# Patient Record
Sex: Male | Born: 1965
Health system: Southern US, Community
[De-identification: ages and names within clinical notes are randomized; demographics above are authoritative.]

## PROBLEM LIST (undated history)

## (undated) DIAGNOSIS — K635 Polyp of colon: Secondary | ICD-10-CM

## (undated) DIAGNOSIS — F419 Anxiety disorder, unspecified: Secondary | ICD-10-CM

## (undated) DIAGNOSIS — G4730 Sleep apnea, unspecified: Secondary | ICD-10-CM

## (undated) DIAGNOSIS — T7840XA Allergy, unspecified, initial encounter: Secondary | ICD-10-CM

## (undated) DIAGNOSIS — K317 Polyp of stomach and duodenum: Secondary | ICD-10-CM

## (undated) DIAGNOSIS — K219 Gastro-esophageal reflux disease without esophagitis: Secondary | ICD-10-CM

## (undated) DIAGNOSIS — F32A Depression, unspecified: Secondary | ICD-10-CM

## (undated) DIAGNOSIS — E785 Hyperlipidemia, unspecified: Secondary | ICD-10-CM

## (undated) DIAGNOSIS — J45909 Unspecified asthma, uncomplicated: Secondary | ICD-10-CM

## (undated) HISTORY — DX: Polyp of stomach and duodenum: K31.7

## (undated) HISTORY — DX: Sleep apnea, unspecified: G47.30

## (undated) HISTORY — DX: Depression, unspecified: F32.A

## (undated) HISTORY — DX: Polyp of colon: K63.5

## (undated) HISTORY — DX: Gastro-esophageal reflux disease without esophagitis: K21.9

## (undated) HISTORY — DX: Unspecified asthma, uncomplicated: J45.909

## (undated) HISTORY — DX: Hyperlipidemia, unspecified: E78.5

## (undated) HISTORY — DX: Allergy, unspecified, initial encounter: T78.40XA

## (undated) HISTORY — DX: Anxiety disorder, unspecified: F41.9

---

## 2004-07-17 ENCOUNTER — Ambulatory Visit: Payer: Self-pay | Admitting: Family Medicine

## 2005-09-23 ENCOUNTER — Ambulatory Visit: Payer: Self-pay | Admitting: Family Medicine

## 2006-07-30 ENCOUNTER — Ambulatory Visit: Payer: Self-pay | Admitting: Internal Medicine

## 2006-07-30 LAB — CONVERTED CEMR LAB
ALT: 33 units/L (ref 0–40)
AST: 24 units/L (ref 0–37)
Basophils Absolute: 0 10*3/uL (ref 0.0–0.1)
Direct LDL: 173.7 mg/dL
Eosinophils Absolute: 0.2 10*3/uL (ref 0.0–0.6)
HCT: 45.2 % (ref 39.0–52.0)
HDL: 37.4 mg/dL — ABNORMAL LOW (ref >39.0)
MCHC: 33.2 g/dL (ref 30.0–36.0)
MCV: 88.2 fL (ref 78.0–100.0)
Neutrophils Relative %: 48.6 % (ref 43.0–77.0)
Platelets: 220 10*3/uL (ref 150–400)
RBC: 5.12 M/uL (ref 4.22–5.81)
RDW: 12.1 % (ref 11.5–14.6)
Total CHOL/HDL Ratio: 6.3
Triglycerides: 143 mg/dL (ref 0–149)

## 2006-09-02 ENCOUNTER — Ambulatory Visit: Payer: Self-pay | Admitting: Family Medicine

## 2008-06-02 ENCOUNTER — Encounter: Payer: Self-pay | Admitting: Family Medicine

## 2008-06-06 ENCOUNTER — Ambulatory Visit: Payer: Self-pay | Admitting: Family Medicine

## 2008-06-06 DIAGNOSIS — R5381 Other malaise: Secondary | ICD-10-CM | POA: Insufficient documentation

## 2008-06-06 DIAGNOSIS — E785 Hyperlipidemia, unspecified: Secondary | ICD-10-CM | POA: Insufficient documentation

## 2008-06-06 DIAGNOSIS — R5383 Other fatigue: Secondary | ICD-10-CM

## 2008-06-14 ENCOUNTER — Ambulatory Visit: Payer: Self-pay | Admitting: Family Medicine

## 2008-06-14 LAB — CONVERTED CEMR LAB
OCCULT 1: NEGATIVE
OCCULT 2: NEGATIVE
OCCULT 3: NEGATIVE

## 2008-06-15 ENCOUNTER — Encounter (INDEPENDENT_AMBULATORY_CARE_PROVIDER_SITE_OTHER): Payer: Self-pay | Admitting: *Deleted

## 2008-06-21 ENCOUNTER — Encounter (INDEPENDENT_AMBULATORY_CARE_PROVIDER_SITE_OTHER): Payer: Self-pay | Admitting: *Deleted

## 2008-06-22 ENCOUNTER — Telehealth (INDEPENDENT_AMBULATORY_CARE_PROVIDER_SITE_OTHER): Payer: Self-pay | Admitting: *Deleted

## 2008-08-08 ENCOUNTER — Ambulatory Visit: Payer: Self-pay | Admitting: Family Medicine

## 2008-08-08 DIAGNOSIS — S335XXA Sprain of ligaments of lumbar spine, initial encounter: Secondary | ICD-10-CM | POA: Insufficient documentation

## 2008-08-08 DIAGNOSIS — J301 Allergic rhinitis due to pollen: Secondary | ICD-10-CM | POA: Insufficient documentation

## 2008-08-08 DIAGNOSIS — M79609 Pain in unspecified limb: Secondary | ICD-10-CM | POA: Insufficient documentation

## 2008-08-18 ENCOUNTER — Telehealth: Payer: Self-pay | Admitting: Family Medicine

## 2008-08-24 ENCOUNTER — Encounter: Payer: Self-pay | Admitting: Family Medicine

## 2008-08-29 ENCOUNTER — Telehealth: Payer: Self-pay | Admitting: Family Medicine

## 2008-09-05 ENCOUNTER — Telehealth: Payer: Self-pay | Admitting: Family Medicine

## 2008-09-26 ENCOUNTER — Telehealth (INDEPENDENT_AMBULATORY_CARE_PROVIDER_SITE_OTHER): Payer: Self-pay | Admitting: *Deleted

## 2008-10-06 ENCOUNTER — Encounter: Payer: Self-pay | Admitting: Family Medicine

## 2008-11-02 ENCOUNTER — Ambulatory Visit: Payer: Self-pay | Admitting: Family Medicine

## 2008-11-15 ENCOUNTER — Telehealth (INDEPENDENT_AMBULATORY_CARE_PROVIDER_SITE_OTHER): Payer: Self-pay | Admitting: *Deleted

## 2008-11-16 LAB — CONVERTED CEMR LAB
AST: 30 units/L (ref 0–37)
Albumin: 4.1 g/dL (ref 3.5–5.2)
Alkaline Phosphatase: 65 units/L (ref 39–117)
Bilirubin, Direct: 0.1 mg/dL (ref 0.0–0.3)
Total Bilirubin: 0.8 mg/dL (ref 0.3–1.2)
Total CHOL/HDL Ratio: 4
Triglycerides: 98 mg/dL (ref 0.0–149.0)

## 2008-12-12 ENCOUNTER — Ambulatory Visit: Payer: Self-pay | Admitting: Family Medicine

## 2008-12-12 DIAGNOSIS — H538 Other visual disturbances: Secondary | ICD-10-CM | POA: Insufficient documentation

## 2008-12-12 DIAGNOSIS — M25519 Pain in unspecified shoulder: Secondary | ICD-10-CM | POA: Insufficient documentation

## 2009-01-09 ENCOUNTER — Telehealth: Payer: Self-pay | Admitting: Family Medicine

## 2009-01-24 ENCOUNTER — Ambulatory Visit: Payer: Self-pay | Admitting: Family Medicine

## 2009-01-24 DIAGNOSIS — K625 Hemorrhage of anus and rectum: Secondary | ICD-10-CM | POA: Insufficient documentation

## 2009-01-24 DIAGNOSIS — K644 Residual hemorrhoidal skin tags: Secondary | ICD-10-CM | POA: Insufficient documentation

## 2009-01-26 ENCOUNTER — Encounter (INDEPENDENT_AMBULATORY_CARE_PROVIDER_SITE_OTHER): Payer: Self-pay | Admitting: *Deleted

## 2009-01-26 LAB — CONVERTED CEMR LAB
Basophils Absolute: 0 10*3/uL (ref 0.0–0.1)
Eosinophils Absolute: 0.1 10*3/uL (ref 0.0–0.7)
HCT: 43.4 % (ref 39.0–52.0)
Lymphs Abs: 1.5 10*3/uL (ref 0.7–4.0)
MCHC: 33.5 g/dL (ref 30.0–36.0)
MCV: 89.8 fL (ref 78.0–100.0)
Monocytes Absolute: 0.4 10*3/uL (ref 0.1–1.0)
Neutro Abs: 2.1 10*3/uL (ref 1.4–7.7)
Platelets: 206 10*3/uL (ref 150.0–400.0)
RDW: 12.3 % (ref 11.5–14.6)

## 2009-01-31 ENCOUNTER — Telehealth (INDEPENDENT_AMBULATORY_CARE_PROVIDER_SITE_OTHER): Payer: Self-pay | Admitting: *Deleted

## 2009-05-15 ENCOUNTER — Telehealth: Payer: Self-pay | Admitting: Family Medicine

## 2009-05-22 ENCOUNTER — Telehealth: Payer: Self-pay | Admitting: Family Medicine

## 2009-05-23 ENCOUNTER — Encounter: Payer: Self-pay | Admitting: Family Medicine

## 2009-05-23 ENCOUNTER — Ambulatory Visit: Payer: Self-pay | Admitting: Internal Medicine

## 2009-05-23 DIAGNOSIS — J309 Allergic rhinitis, unspecified: Secondary | ICD-10-CM | POA: Insufficient documentation

## 2009-05-23 DIAGNOSIS — R079 Chest pain, unspecified: Secondary | ICD-10-CM | POA: Insufficient documentation

## 2009-05-24 ENCOUNTER — Telehealth (INDEPENDENT_AMBULATORY_CARE_PROVIDER_SITE_OTHER): Payer: Self-pay | Admitting: *Deleted

## 2009-05-25 ENCOUNTER — Encounter (INDEPENDENT_AMBULATORY_CARE_PROVIDER_SITE_OTHER): Payer: Self-pay | Admitting: *Deleted

## 2009-05-25 LAB — CONVERTED CEMR LAB: Total CK: 1676 units/L — ABNORMAL HIGH (ref 7–232)

## 2009-05-30 ENCOUNTER — Telehealth (INDEPENDENT_AMBULATORY_CARE_PROVIDER_SITE_OTHER): Payer: Self-pay | Admitting: *Deleted

## 2009-06-02 ENCOUNTER — Ambulatory Visit: Payer: Self-pay | Admitting: Internal Medicine

## 2009-06-07 ENCOUNTER — Encounter (INDEPENDENT_AMBULATORY_CARE_PROVIDER_SITE_OTHER): Payer: Self-pay | Admitting: *Deleted

## 2009-06-07 ENCOUNTER — Ambulatory Visit: Payer: Self-pay | Admitting: Family Medicine

## 2009-06-07 DIAGNOSIS — B354 Tinea corporis: Secondary | ICD-10-CM | POA: Insufficient documentation

## 2009-06-07 LAB — CONVERTED CEMR LAB: Sed Rate: 8 mm/hr (ref 0–22)

## 2009-06-08 DIAGNOSIS — R748 Abnormal levels of other serum enzymes: Secondary | ICD-10-CM | POA: Insufficient documentation

## 2009-06-09 ENCOUNTER — Encounter (INDEPENDENT_AMBULATORY_CARE_PROVIDER_SITE_OTHER): Payer: Self-pay | Admitting: *Deleted

## 2009-06-09 ENCOUNTER — Telehealth (INDEPENDENT_AMBULATORY_CARE_PROVIDER_SITE_OTHER): Payer: Self-pay | Admitting: *Deleted

## 2009-06-14 ENCOUNTER — Telehealth: Payer: Self-pay | Admitting: Family Medicine

## 2009-06-15 ENCOUNTER — Telehealth: Payer: Self-pay | Admitting: Family Medicine

## 2009-07-10 ENCOUNTER — Encounter: Payer: Self-pay | Admitting: Family Medicine

## 2009-07-12 ENCOUNTER — Telehealth: Payer: Self-pay | Admitting: Family Medicine

## 2009-07-19 ENCOUNTER — Telehealth: Payer: Self-pay | Admitting: Family Medicine

## 2009-07-27 ENCOUNTER — Encounter (INDEPENDENT_AMBULATORY_CARE_PROVIDER_SITE_OTHER): Payer: Self-pay | Admitting: *Deleted

## 2009-07-27 ENCOUNTER — Ambulatory Visit: Payer: Self-pay | Admitting: Family Medicine

## 2009-07-27 DIAGNOSIS — K921 Melena: Secondary | ICD-10-CM | POA: Insufficient documentation

## 2009-07-28 ENCOUNTER — Telehealth (INDEPENDENT_AMBULATORY_CARE_PROVIDER_SITE_OTHER): Payer: Self-pay | Admitting: *Deleted

## 2009-08-23 ENCOUNTER — Encounter (INDEPENDENT_AMBULATORY_CARE_PROVIDER_SITE_OTHER): Payer: Self-pay | Admitting: *Deleted

## 2009-08-23 ENCOUNTER — Ambulatory Visit: Payer: Self-pay | Admitting: Gastroenterology

## 2009-09-01 ENCOUNTER — Ambulatory Visit: Payer: Self-pay | Admitting: Gastroenterology

## 2009-09-01 DIAGNOSIS — K635 Polyp of colon: Secondary | ICD-10-CM

## 2009-09-01 HISTORY — DX: Polyp of colon: K63.5

## 2009-09-05 ENCOUNTER — Encounter: Payer: Self-pay | Admitting: Gastroenterology

## 2009-11-10 ENCOUNTER — Telehealth (INDEPENDENT_AMBULATORY_CARE_PROVIDER_SITE_OTHER): Payer: Self-pay | Admitting: *Deleted

## 2009-11-14 ENCOUNTER — Ambulatory Visit: Payer: Self-pay | Admitting: Family Medicine

## 2009-11-15 LAB — CONVERTED CEMR LAB
ALT: 37 units/L (ref 0–53)
AST: 37 units/L (ref 0–37)
Alkaline Phosphatase: 68 units/L (ref 39–117)
Bilirubin, Direct: 0.1 mg/dL (ref 0.0–0.3)
Cholesterol: 145 mg/dL (ref 0–200)
Total Bilirubin: 0.3 mg/dL (ref 0.3–1.2)
Total Protein: 7.3 g/dL (ref 6.0–8.3)

## 2009-11-22 ENCOUNTER — Telehealth: Payer: Self-pay | Admitting: Family Medicine

## 2010-02-05 ENCOUNTER — Ambulatory Visit: Payer: Self-pay | Admitting: Family Medicine

## 2010-02-05 DIAGNOSIS — R209 Unspecified disturbances of skin sensation: Secondary | ICD-10-CM | POA: Insufficient documentation

## 2010-02-07 LAB — CONVERTED CEMR LAB
Basophils Relative: 0.6 % (ref 0.0–3.0)
Eosinophils Relative: 2.3 % (ref 0.0–5.0)
Folate: 15.6 ng/mL
HCT: 39.4 % (ref 39.0–52.0)
Hemoglobin: 13.1 g/dL (ref 13.0–17.0)
Lymphs Abs: 1.3 10*3/uL (ref 0.7–4.0)
MCV: 90.6 fL (ref 78.0–100.0)
Monocytes Relative: 12.2 % — ABNORMAL HIGH (ref 3.0–12.0)
Platelets: 194 10*3/uL (ref 150.0–400.0)
RBC: 4.35 M/uL (ref 4.22–5.81)
Saturation Ratios: 24.3 % (ref 20.0–50.0)
TSH: 1.16 microintl units/mL (ref 0.35–5.50)
WBC: 3.3 10*3/uL — ABNORMAL LOW (ref 4.5–10.5)

## 2010-05-18 ENCOUNTER — Ambulatory Visit
Admission: RE | Admit: 2010-05-18 | Discharge: 2010-05-18 | Payer: Self-pay | Source: Home / Self Care | Attending: Family Medicine | Admitting: Family Medicine

## 2010-05-18 DIAGNOSIS — J45909 Unspecified asthma, uncomplicated: Secondary | ICD-10-CM | POA: Insufficient documentation

## 2010-06-01 ENCOUNTER — Ambulatory Visit
Admission: RE | Admit: 2010-06-01 | Discharge: 2010-06-01 | Payer: Self-pay | Source: Home / Self Care | Attending: Family Medicine | Admitting: Family Medicine

## 2010-06-01 DIAGNOSIS — M549 Dorsalgia, unspecified: Secondary | ICD-10-CM | POA: Insufficient documentation

## 2010-06-01 LAB — CONVERTED CEMR LAB
Ketones, urine, test strip: NEGATIVE
Nitrite: NEGATIVE
Urobilinogen, UA: 0.2
WBC Urine, dipstick: NEGATIVE

## 2010-06-15 ENCOUNTER — Ambulatory Visit
Admission: RE | Admit: 2010-06-15 | Discharge: 2010-06-15 | Payer: Self-pay | Source: Home / Self Care | Attending: Family Medicine | Admitting: Family Medicine

## 2010-06-15 ENCOUNTER — Encounter: Payer: Self-pay | Admitting: Family Medicine

## 2010-06-15 ENCOUNTER — Other Ambulatory Visit: Payer: Self-pay | Admitting: Family Medicine

## 2010-06-15 ENCOUNTER — Emergency Department (INDEPENDENT_AMBULATORY_CARE_PROVIDER_SITE_OTHER)
Admission: EM | Admit: 2010-06-15 | Discharge: 2010-06-16 | Payer: Federal, State, Local not specified - PPO | Source: Home / Self Care | Admitting: Emergency Medicine

## 2010-06-15 DIAGNOSIS — R079 Chest pain, unspecified: Secondary | ICD-10-CM

## 2010-06-15 LAB — COMPREHENSIVE METABOLIC PANEL
AST: 42 U/L — ABNORMAL HIGH (ref 0–37)
Albumin: 4.2 g/dL (ref 3.5–5.2)
Calcium: 9.5 mg/dL (ref 8.4–10.5)
Chloride: 108 mEq/L (ref 96–112)
Creatinine, Ser: 1.3 mg/dL (ref 0.4–1.5)
GFR calc Af Amer: >60 mL/min (ref >60)
Total Protein: 7.7 g/dL (ref 6.0–8.3)

## 2010-06-15 LAB — CK: Total CK: 437 U/L — ABNORMAL HIGH (ref 7–232)

## 2010-06-15 LAB — CBC
HCT: 40.1 % (ref 39.0–52.0)
MCH: 28.4 pg (ref 26.0–34.0)
MCHC: 34.2 g/dL (ref 30.0–36.0)
MCV: 83.2 fL (ref 78.0–100.0)
Platelets: 211 10*3/uL (ref 150–400)
RDW: 13.3 % (ref 11.5–15.5)

## 2010-06-15 LAB — BASIC METABOLIC PANEL
Chloride: 106 mEq/L (ref 96–112)
Creatinine, Ser: 1.2 mg/dL (ref 0.4–1.5)
GFR: 88.85 mL/min (ref >60.00)
Potassium: 4.1 mEq/L (ref 3.5–5.1)

## 2010-06-15 LAB — URINALYSIS, ROUTINE W REFLEX MICROSCOPIC
Hgb urine dipstick: NEGATIVE
Specific Gravity, Urine: 1.025 (ref 1.005–1.030)
Urine Glucose, Fasting: NEGATIVE mg/dL

## 2010-06-15 LAB — POCT CARDIAC MARKERS
Myoglobin, poc: 69.1 ng/mL (ref 12–200)
Troponin i, poc: <0.05 ng/mL (ref 0.00–0.09)

## 2010-06-15 LAB — DIFFERENTIAL
Eosinophils Absolute: 0.2 10*3/uL (ref 0.0–0.7)
Eosinophils Relative: 3 % (ref 0–5)
Lymphocytes Relative: 36 % (ref 12–46)
Lymphs Abs: 1.8 10*3/uL (ref 0.7–4.0)
Monocytes Absolute: 0.6 10*3/uL (ref 0.1–1.0)
Monocytes Relative: 13 % — ABNORMAL HIGH (ref 3–12)

## 2010-06-15 LAB — LIPID PANEL
LDL Cholesterol: 114 mg/dL — ABNORMAL HIGH (ref 0–99)
Total CHOL/HDL Ratio: 3

## 2010-06-15 LAB — CBC WITH DIFFERENTIAL/PLATELET
Basophils Relative: 0.7 % (ref 0.0–3.0)
Eosinophils Relative: 2.3 % (ref 0.0–5.0)
MCV: 89 fl (ref 78.0–100.0)
Monocytes Absolute: 0.5 10*3/uL (ref 0.1–1.0)
Monocytes Relative: 11 % (ref 3.0–12.0)
Neutrophils Relative %: 51.6 % (ref 43.0–77.0)
RBC: 4.8 Mil/uL (ref 4.22–5.81)
WBC: 4.2 10*3/uL — ABNORMAL LOW (ref 4.5–10.5)

## 2010-06-15 LAB — HEPATIC FUNCTION PANEL
ALT: 32 U/L (ref 0–53)
AST: 34 U/L (ref 0–37)
Alkaline Phosphatase: 70 U/L (ref 39–117)
Bilirubin, Direct: 0.1 mg/dL (ref 0.0–0.3)
Total Protein: 7.3 g/dL (ref 6.0–8.3)

## 2010-06-15 LAB — POCT TOXICOLOGY PANEL

## 2010-06-16 ENCOUNTER — Inpatient Hospital Stay (HOSPITAL_COMMUNITY): Admission: EM | Admit: 2010-06-16 | Discharge: 2010-06-17 | Payer: Self-pay

## 2010-06-16 LAB — POCT CARDIAC MARKERS
CKMB, poc: 1.4 ng/mL (ref 1.0–8.0)
Troponin i, poc: <0.05 ng/mL (ref 0.00–0.09)

## 2010-06-16 LAB — CARDIAC PANEL(CRET KIN+CKTOT+MB+TROPI)
CK, MB: 3 ng/mL (ref 0.3–4.0)
CK, MB: 3 ng/mL (ref 0.3–4.0)
CK, MB: 2.8 ng/mL (ref 0.3–4.0)
Relative Index: 0.8 (ref 0.0–2.5)
Relative Index: 0.8 (ref 0.0–2.5)
Troponin I: <0.01 ng/mL (ref 0.00–0.06)

## 2010-06-17 LAB — CONVERTED CEMR LAB
ALT: 39 units/L (ref 0–53)
Alkaline Phosphatase: 68 units/L (ref 39–117)
BUN: 10 mg/dL (ref 6–23)
Basophils Absolute: 0 10*3/uL (ref 0.0–0.1)
Basophils Relative: 0.5 % (ref 0.0–3.0)
Basophils Relative: 0.5 % (ref 0.0–3.0)
Bilirubin, Direct: 0.1 mg/dL (ref 0.0–0.3)
Bilirubin, Direct: 0.1 mg/dL (ref 0.0–0.3)
CK-MB: 4.1 ng/mL — ABNORMAL HIGH (ref 0.3–4.0)
CO2: 30 meq/L (ref 19–32)
Calcium: 9.4 mg/dL (ref 8.4–10.5)
Calcium: 9.8 mg/dL (ref 8.4–10.5)
Cholesterol: 234 mg/dL — ABNORMAL HIGH (ref 0–200)
Cholesterol: 274 mg/dL (ref 0–200)
Creatinine, Ser: 1.2 mg/dL (ref 0.4–1.5)
Creatinine, Ser: 1.3 mg/dL (ref 0.4–1.5)
Direct LDL: 177.6 mg/dL
Direct LDL: 214.9 mg/dL
Eosinophils Absolute: 0.1 10*3/uL (ref 0.0–0.7)
GFR calc Af Amer: 86 mL/min
HCT: 43.5 % (ref 39.0–52.0)
Hemoglobin: 14.8 g/dL (ref 13.0–17.0)
Lymphocytes Relative: 30.8 % (ref 12.0–46.0)
Lymphocytes Relative: 34.6 % (ref 12.0–46.0)
MCHC: 32.1 g/dL (ref 30.0–36.0)
MCHC: 34.1 g/dL (ref 30.0–36.0)
MCV: 89.7 fL (ref 78.0–100.0)
MCV: 92.2 fL (ref 78.0–100.0)
Monocytes Absolute: 0.4 10*3/uL (ref 0.1–1.0)
Monocytes Absolute: 0.4 10*3/uL (ref 0.1–1.0)
Neutro Abs: 1.8 10*3/uL (ref 1.4–7.7)
Neutrophils Relative %: 54.2 % (ref 43.0–77.0)
PSA: 1.91 ng/mL (ref 0.10–4.00)
Platelets: 241 10*3/uL (ref 150.0–400.0)
RBC: 4.69 M/uL (ref 4.22–5.81)
RBC: 4.85 M/uL (ref 4.22–5.81)
RDW: 12.6 % (ref 11.5–14.6)
Relative Index: 0.6 (ref 0.0–2.5)
Sex Hormone Binding: 12 nmol/L — ABNORMAL LOW (ref 13–71)
Sodium: 142 meq/L (ref 135–145)
TSH: 1.88 microintl units/mL (ref 0.35–5.50)
Testosterone-% Free: 3 % — ABNORMAL HIGH (ref 1.6–2.9)
Testosterone: 195.16 ng/dL — ABNORMAL LOW (ref 350–890)
Total Bilirubin: 1 mg/dL (ref 0.3–1.2)
Total Bilirubin: 1 mg/dL (ref 0.3–1.2)
Total CHOL/HDL Ratio: 4
Total CK: 646 units/L — ABNORMAL HIGH (ref 7–232)
Total Protein: 7.4 g/dL (ref 6.0–8.3)
Total Protein: 7.5 g/dL (ref 6.0–8.3)
Triglycerides: 104 mg/dL (ref 0.0–149.0)
VLDL: 20.8 mg/dL (ref 0.0–40.0)
WBC: 3.2 10*3/uL — ABNORMAL LOW (ref 4.5–10.5)

## 2010-06-18 ENCOUNTER — Encounter: Payer: Self-pay | Admitting: Family Medicine

## 2010-06-19 NOTE — Progress Notes (Signed)
Summary: xyzal refill   Phone Note Refill Request Message from:  Patient on May 30, 2009 10:01 AM  Refills Requested: Medication #1:  XYZAL 5 MG TABS take 1 tab once daily    Prescriptions: XYZAL 5 MG TABS (LEVOCETIRIZINE DIHYDROCHLORIDE) take 1 tab once daily  #90 x 1   Entered by:   Doristine Devoid   Authorized by:   Loreen Freud DO   Signed by:   Doristine Devoid on 05/30/2009   Method used:   Electronically to        [facility]* (mail-order)             ,          Ph: [phone]       Fax: [phone]   RxID:   [id number]

## 2010-06-19 NOTE — Progress Notes (Signed)
Summary: Lab results   Phone Note Outgoing Call   Call placed by: Army Fossa CMA,  June 09, 2009 9:20 AM Summary of Call: Regarding lab results, LMTCB:  because CPK is elevated and he had CP we need to get stress test if neg---he will need rheum referral to evaluate for muscle disorder  Follow-up for Phone Call        Pt is aware. Army Fossa CMA  June 12, 2009 4:08 PM

## 2010-06-19 NOTE — Assessment & Plan Note (Signed)
Summary: XYZAL NOT WORKING, ? CAUSING CHEST TIGHTNESS/RH.....   Vital Signs:  Patient profile:   45 year old male Weight:      231.2 pounds Temp:     98.4 degrees F oral Pulse rate:   60 / minute Resp:     14 per minute BP sitting:   126 / 72  (left arm) Cuff size:   large  Vitals Entered By: Shonna Chock (May 23, 2009 2:56 PM) CC: Chest tightness x 2 weeks, worse x 2 days Comments REVIEWED MED LIST, PATIENT AGREED DOSE AND INSTRUCTION CORRECT    Primary Care Provider:  Laury Axon  CC:  Chest tightness x 2 weeks and worse x 2 days.  History of Present Illness: Intermittent chest tightness over 2 weeks ; "up to a 4.5 on 10 scale".  The patient reports resting chest pain and shortness of breath, but denies exertional chest pain, nausea, vomiting, diaphoresis, palpitations, dizziness, light headedness, syncope, and indigestion.  The pain is described as intermittent and dull.  The pain is located in the substernal area & diffusely in entire chest  and the pain does not radiate.  Episodes of chest pain last 5-10 minutes.  The pain is brought  while stationed in  in [place]. Albuteral MDI helped similar symptoms but more severe than now. His mother had HTN; no FH of CAD.  Allergies (verified): No Known Drug Allergies  Past History:  Past Medical History: Hyperlipidemia Allergic rhinitis  Past Surgical History: Denies surgical history  Review of Systems Resp:  Denies cough, coughing up blood, pleuritic, and sputum productive; Occa wheeze.  Physical Exam  General:  Well-developed,well-nourished,in no acute distress; alert,appropriate and cooperative throughout examination Eyes:  No corneal or conjunctival inflammation noted. No icterus.Perrla.  Mouth:  Oral mucosa and oropharynx without lesions or exudates.  Teeth in good repair. No pharyngeal erythema.   Lungs:  Normal respiratory effort, chest expands symmetrically. Lungs are clear to auscultation, no crackles or  wheezes. Heart:  Normal rate and regular rhythm. S1 and S2 normal without gallop, murmur, click, rub . S4 with slurring Abdomen:  Bowel sounds positive,abdomen soft and non-tender without masses, organomegaly or hernias noted. Pulses:  R and L carotid,radial,dorsalis pedis and posterior tibial pulses are full and equal bilaterally Extremities:  No clubbing, cyanosis, edema, or deformity noted . Homan's negative Neurologic:  alert & oriented X3.   Skin:  Intact without suspicious lesions or rashes Cervical Nodes:  No lymphadenopathy noted Axillary Nodes:  No palpable lymphadenopathy Psych:  memory intact for recent and remote, normally interactive, and good eye contact.     Impression & Recommendations:  Problem # 1:  CHEST PAIN (ICD-786.50)  Atypical, ? asthma variant  Orders: EKG w/ Interpretation (93[address]) Venipuncture ([address]) TLB-Cardiac Panel ([id number]) T-D-Dimer Fibrin Derivatives Quantitive [phone]) T- * Misc. Laboratory test [phone])  Problem # 2:  ALLERGIC RHINITIS (ICD-477.9)  His updated medication list for this problem includes:    Xyzal 5 Mg Tabs (Levocetirizine dihydrochloride) .Marland Kitchen... Take 1 tab once daily  Complete Medication List: 1)  Xyzal 5 Mg Tabs (Levocetirizine dihydrochloride) .... Take 1 tab once daily 2)  Singulair 10 Mg Tabs (Montelukast sodium) .Marland Kitchen.. 1 once daily 3)  Proventil Hfa 108 (90 Base) Mcg/act Aers (Albuterol sulfate) .Marland Kitchen.. 1-2 puffs every 4 hours as needed  Patient Instructions: 1)  Call if symptoms persist with new meds. Prescriptions: PROVENTIL HFA 108 (90 BASE) MCG/ACT AERS (ALBUTEROL SULFATE) 1-2 puffs every 4 hours as needed  #1 x 2  Entered and Authorized by:   Marga Melnick MD   Signed by:   Marga Melnick MD on 05/23/2009   Method used:   Print then Give to Patient   RxID:   [id number] SINGULAIR 10 MG TABS (MONTELUKAST SODIUM) 1 once daily  #14 x 0   Entered and Authorized by:   Marga Melnick MD   Signed by:    Marga Melnick MD on 05/23/2009   Method used:   Samples Given   RxID:   [id number]

## 2010-06-19 NOTE — Letter (Signed)
Summary: Primary Care Consult Scheduled Letter  [place] [place]  [address] [place], [place] [address]   Phone: [phone]  Fax: [phone]      06/09/2009 MRN: [id number]  [facility] [address] [place], [place]  [address]    Dear Mr. Panepinto,    We have scheduled an appointment for you.  At the recommendation of Dr. Loreen Freud, we have scheduled you a consult with Dr. Azzie Roup with [facility] on 06-16-2009 at 10:00am.  Their address is [address] [place] [address]. The office phone number is [phone].  If this appointment day and time is not convenient for you, please feel free to call the office of the doctor you are being referred to at the number listed above and reschedule the appointment.    It is important for you to keep your scheduled appointments. We are here to make sure you are given good patient care.   Thank you,    Renee, Patient Care Coordinator [place] [facility]

## 2010-06-19 NOTE — Progress Notes (Signed)
Summary: Vytorin  Phone Note Call from Patient   Caller: Patient Summary of Call: Pt called and he stated that he saw [facility] Monday and he stated that he does not see a problem with him starting his Vytorin again. He would like to know if you will now refill this for him? Army Fossa CMA  July 12, 2009 10:17 AM   Follow-up for Phone Call        vytorin 10 /20 1 by mouth once daily #30  2 refills ---recheck labs in 3 months Follow-up by: Loreen Freud DO,  July 12, 2009 10:24 AM  Additional Follow-up for Phone Call Additional follow up Details #1::        Pt is aware, meds sent in. Army Fossa CMA  July 12, 2009 10:47 AM     New/Updated Medications: VYTORIN 10-20 MG TABS (EZETIMIBE-SIMVASTATIN) 1 by mouth once daily. Prescriptions: VYTORIN 10-20 MG TABS (EZETIMIBE-SIMVASTATIN) 1 by mouth once daily.  #30 x 2   Entered by:   Army Fossa CMA   Authorized by:   Loreen Freud DO   Signed by:   Army Fossa CMA on 07/12/2009   Method used:   Electronically to        [facility] [phone]* (retail)       [address]       [place], [place]  [address]       Ph: [phone] or [phone]       Fax: [phone]   RxID:   [id number]

## 2010-06-19 NOTE — Progress Notes (Signed)
Summary: Lab Results  Phone Note Outgoing Call Call back at [facility] Phone [phone]   Call placed by: Shonna Chock,  May 24, 2009 5:13 PM Call placed to: Patient Summary of Call: Left message on machine for patient to return call when avaliable, Reason for call:   Muscle Enzyme levels elevated, patient needs appointment with all med bottles./[facility]  May 24, 2009 5:14 PM   Follow-up for Phone Call        see Appendum to labs Follow-up by: Marga Melnick MD,  May 25, 2009 6:59 AM  Additional Follow-up for Phone Call Additional follow up Details #1::        Spoke with patient, patient ok'd information and scheduled appointment to recheck labs on Monday 05/29/2009. Copy of labs mailed Additional Follow-up by: Shonna Chock,  May 25, 2009 8:34 AM

## 2010-06-19 NOTE — Letter (Signed)
Summary: Results Follow up Letter  [facility] [place]  [address] [place], [place] [address]   Phone: [phone]  Fax: [phone]    05/25/2009 MRN: [id number]  [facility] [address] [place], [place]  [address]  Dear Mr. Papesh,  The following are the results of your recent test(s):  Test         Result    Pap Smear:        Normal _____  Not Normal _____ Comments: ______________________________________________________ Cholesterol: LDL(Bad cholesterol):         Your goal is less than:         HDL (Good cholesterol):       Your goal is more than: Comments:  ______________________________________________________ Mammogram:        Normal _____  Not Normal _____ Comments:  ___________________________________________________________________ Hemoccult:        Normal _____  Not normal _______ Comments:    _____________________________________________________________________ Other Tests: Please see attached labs done on 05/23/2009    We routinely do not discuss normal results over the telephone.  If you desire a copy of the results, or you have any questions about this information we can discuss them at your next office visit.   Sincerely,

## 2010-06-19 NOTE — Consult Note (Signed)
Summary: [facility]   Imported By: Lanelle Bal 07/19/2009 12:59:55  _____________________________________________________________________  External Attachment:    Type:   Image     Comment:   External Document

## 2010-06-19 NOTE — Assessment & Plan Note (Signed)
Summary: blood in stool/hemorroids--ch.    History of Present Illness Visit Type: Initial Consult Primary GI MD: Melvia Heaps MD [facility] Primary Provider: Loreen Freud, DO Requesting Provider: Loreen Freud, DO Chief Complaint: rectal bleeding and hemorrhoids  History of Present Illness:   Juan Velazquez is a pleasant 45 year old African American male referred at the request of Dr. Laury Axon for evaluation of rectal bleeding.  For several months he has been complaining of intermittent painless rectal bleeding consistent ing of bright red blood mixed with his stools.  There has been no change in his bowel habits.  He has taken no medications.   GI Review of Systems    Reports acid reflux.      Denies abdominal pain, belching, bloating, chest pain, dysphagia with liquids, dysphagia with solids, heartburn, loss of appetite, nausea, vomiting, vomiting blood, weight loss, and  weight gain.      Reports constipation, diarrhea, hemorrhoids, and  rectal bleeding.     Denies anal fissure, black tarry stools, change in bowel habit, diverticulosis, fecal incontinence, heme positive stool, irritable bowel syndrome, jaundice, light color stool, liver problems, and  rectal pain.    Current Medications (verified): 1)  Xyzal 5 Mg Tabs (Levocetirizine Dihydrochloride) .... Take 1 Tab Once Daily 2)  Vytorin 10-20 Mg Tabs (Ezetimibe-Simvastatin) .Marland Kitchen.. 1 By Mouth Once Daily. 3)  Viagra 100 Mg Tabs (Sildenafil Citrate) .... As Directed.  Allergies (verified): No Known Drug Allergies  Past History:  Past Medical History: Hyperlipidemia Allergic rhinitis Asthma  Past Surgical History: Reviewed history from 05/23/2009 and no changes required. Denies surgical history  Family History: Reviewed history from 06/06/2008 and no changes required. Family History Diabetes 1st degree relative--M brother ---DMII sister --DM MAunt-- Colon Cancer Family History Hypertension  Social History: Reviewed history  from 06/06/2008 and no changes required. 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 Smoked Alcohol use-no Drug use-no Regular exercise-yes  Review of Systems       The patient complains of allergy/sinus.  The patient denies anemia, anxiety-new, arthritis/joint pain, back pain, blood in urine, breast changes/lumps, change in vision, confusion, cough, coughing up blood, depression-new, fainting, fatigue, fever, headaches-new, hearing problems, heart murmur, heart rhythm changes, itching, muscle pains/cramps, night sweats, nosebleeds, shortness of breath, skin rash, sleeping problems, sore throat, swelling of feet/legs, swollen lymph glands, thirst - excessive, urination - excessive, urination changes/pain, urine leakage, vision changes, and voice change.         All other systems were reviewed and were negative   Vital Signs:  Patient profile:   45 year old male Height:      69 inches Weight:      233 pounds BMI:     34.53 Pulse rate:   68 / minute Pulse rhythm:   regular BP sitting:   122 / 84  (left arm)  Vitals Entered By: Milford Cage NCMA (August 23, 2009 10:50 AM)  Physical Exam  Additional Exam:  On physical exam he is a healthy-appearing male  skin: anicter  HEENT: normocephalic; PEERLA; no nasal or orpharyngeal abnormalities neck: supple nodes: no cervical adenopathy chest: clear cor:  no murmurs, gallops or rubs abd:  bowel sounds normoactive; no abdominal masses, tenderness, organomegaly rectal: no masses; stool heme negative; grade 3 hemorrhoids are present ext: no cyanosis, clubbing, or edema skeletal: no gross skeletal abnormalities neuro: alert, oriented x 3; no focal abnormalities    Impression & Recommendations:  Problem # 1:  BLOOD IN STOOL (ICD-578.1)  Limited rectal bleeding is most likely due  to  hemorrhoids.  A more proximal colonic bleeding source is less likely though ought  to be ruled out.  Recommendations #1 Anusol-HC suppositories #2  colonoscopy  Risks, alternatives, and complications of the procedure, including bleeding, perforation, and possible need for surgery, were explained to the patient.  Patient's questions were answered.  Orders: Colonoscopy (Colon)  Patient Instructions: 1)  Colonoscopy and Flexible Sigmoidoscopy brochure given.  2)  Conscious Sedation brochure given.  3)  Hemorrhoids brochure given.  4)  Please pick up your medications at your pharmacy.  5)  We will see you at your procedure on 09/01/09. 6)  [facility] Patient Information Guide given to patient.  7)  Copy sent to : Loreen Freud, MD 8)  The medication list was reviewed and reconciled.  All changed / newly prescribed medications were explained.  A complete medication list was provided to the patient / caregiver. Prescriptions: MOVIPREP 100 GM  SOLR (PEG-KCL-NACL-NASULF-NA ASC-C) As per prep instructions.  #1 x 0   Entered by:   Francee Piccolo CMA (AAMA)   Authorized by:   Louis Meckel MD   Signed by:   Francee Piccolo CMA (AAMA) on 08/23/2009   Method used:   [facility] [phone]       [address]       [place], [place]  [address]       Ph: [phone] or [phone]       Fax: [phone]   RxID:   [id number] ANUSOL-HC 25 MG SUPP (HYDROCORTISONE ACETATE) take one suppository q.h.s.  #7 x 1   Entered and Authorized by:   Louis Meckel MD   Signed by:   Louis Meckel MD on 08/23/2009   Method used:   Electronically to        [facility] [phone][facility] (retail)       [address]       [place], [place]  [address]       Ph: [phone] or [phone]       Fax: [phone]   RxID:   [id number]

## 2010-06-19 NOTE — Letter (Signed)
Summary: Patient Notice-Hyperplastic Polyps   Gastroenterology  [address] [place], [place] [address]   Phone: [phone]  Fax: [phone]        September 05, 2009 MRN: [id number]    Juan Velazquez [address] [place], [place]  [address]    Dear [name],  I am pleased to inform you that the colon polyp(s) removed during your recent colonoscopy was (were) found to be hyperplastic.  These types of polyps are NOT pre-cancerous.  It is therefore my recommendation that you have a repeat colonoscopy examination in 10_ years for routine colorectal cancer screening.  Should you develop new or worsening symptoms of abdominal pain, bowel habit changes or bleeding from the rectum or bowels, please schedule an evaluation with either your primary care physician or with me.  Additional information/recommendations:  __No further action with gastroenterology is needed at this time.      Please follow-up with your primary care physician for your other      healthcare needs. __Please call [phone] to schedule a return visit to review      your situation.  __Please keep your follow-up visit as already scheduled.  x__Continue treatment plan as outlined the day of your exam.  Please call us if you are having persistent problems or have questions about your condition that have not been fully answered at this time.  Sincerely,  Louis Meckel MD This letter has been electronically signed by your physician.  Appended Document: Patient Notice-Hyperplastic Polyps letter mailed 4.25.11

## 2010-06-19 NOTE — Assessment & Plan Note (Signed)
Summary: blood in stool//lch   Vital Signs:  Patient profile:   45 year old male Weight:      228 pounds Temp:     98.6 degrees F oral Pulse rate:   65 / minute Pulse rhythm:   regular BP sitting:   126 / 80  (left arm) Cuff size:   large  Vitals Entered By: Army Fossa CMA (July 27, 2009 10:58 AM) CC: Pt here for c/o blood in stool, no pain when having a bowel movement. Has noticied since october.   History of Present Illness: Pt here c/o blood in stool since october.  Pt states he did stool cards after physical but we do not have them.  Pt admits to hemrrhoids but they have not been bleeding.  He denies straining with BM or constipation.  Pt is adding fiber to diet.   No abd pain, no diarrhea.     Current Medications (verified): 1)  Xyzal 5 Mg Tabs (Levocetirizine Dihydrochloride) .... Take 1 Tab Once Daily 2)  Singulair 10 Mg Tabs (Montelukast Sodium) .Marland Kitchen.. 1 Once Daily 3)  Proventil Hfa 108 (90 Base) Mcg/act Aers (Albuterol Sulfate) .Marland Kitchen.. 1-2 Puffs Every 4 Hours As Needed 4)  Vytorin 10-20 Mg Tabs (Ezetimibe-Simvastatin) .Marland Kitchen.. 1 By Mouth Once Daily. 5)  Viagra 100 Mg Tabs (Sildenafil Citrate) .... As Directed.  Allergies (verified): No Known Drug Allergies  Past History:  Past medical, surgical, family and social histories (including risk factors) reviewed for relevance to current acute and chronic problems.  Past Medical History: Reviewed history from 05/23/2009 and no changes required. Hyperlipidemia Allergic rhinitis  Past Surgical History: Reviewed history from 05/23/2009 and no changes required. Denies surgical history  Family History: Reviewed history from 06/06/2008 and no changes required. Family History Diabetes 1st degree relative--M brother ---DMII sister --DM MAunt-- Colon Cancer Family History Hypertension  Social History: Reviewed history from 06/06/2008 and no changes required. 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 Rectal:  heme positive stool small ext hemrrhoid--not bleeding Psych:  Oriented X3 and normally interactive.     Impression & Recommendations:  Problem # 1:  BLOOD IN STOOL (ICD-578.1)  Orders: Gastroenterology Referral (GI) take prilosec otc 1 by mouth once daily   Complete Medication List: 1)  Xyzal 5 Mg Tabs (Levocetirizine dihydrochloride) .... Take 1 tab once daily 2)  Singulair 10 Mg Tabs (Montelukast sodium) .Marland Kitchen.. 1 once daily 3)  Proventil Hfa 108 (90 Base) Mcg/act Aers (Albuterol sulfate) .Marland Kitchen.. 1-2 puffs every 4 hours as needed 4)  Vytorin 10-20 Mg Tabs (Ezetimibe-simvastatin) .Marland Kitchen.. 1 by mouth once daily. 5)  Viagra 100 Mg Tabs (Sildenafil citrate) .... As directed. 6)  Prilosec Otc 20 Mg Tbec (Omeprazole magnesium) .Marland Kitchen.. 1 by mouth once daily

## 2010-06-19 NOTE — Procedures (Signed)
Summary: Colonoscopy  Patient: Juan Velazquez Note: All result statuses are Final unless otherwise noted.  Tests: (1) Colonoscopy (COL)   COL Colonoscopy           DONE     [facility]     [address]     [place], [place]  [address]           COLONOSCOPY PROCEDURE REPORT           PATIENT:  Juan Velazquez, Juan Velazquez  MR#:  [id number]     BIRTHDATE:  10-14-1965, 44 yrs. old  GENDER:  male           ENDOSCOPIST:  Barbette Hair. Arlyce Dice, MD     Referred by:  Loreen Freud, DO           PROCEDURE DATE:  09/01/2009     PROCEDURE:  Colonoscopy with snare polypectomy     ASA CLASS:  Class I     INDICATIONS:  1) rectal bleeding           MEDICATIONS:   Fentanyl 75 mcg IV, Versed 8 mg IV           DESCRIPTION OF PROCEDURE:   After the risks benefits and     alternatives of the procedure were thoroughly explained, informed     consent was obtained.  Digital rectal exam was performed and     revealed no abnormalities.   The LB [id number] endoscope     was introduced through the anus and advanced to the cecum, which     was identified by both the appendix and ileocecal valve, without     limitations.  The quality of the prep was excellent, using     MoviPrep.  The instrument was then slowly withdrawn as the colon     was fully examined.     <<PROCEDUREIMAGES>>           FINDINGS:  A diminutive polyp was found in the sigmoid colon. It     was 3 mm in size. It was found 15 cm from the point of entry.     Polyp was snared without cautery. Retrieval was successful (see     image11). snare polyp  This was otherwise a normal examination of     the colon (see image2, image3, image4, image7, image8, image9,     image10, and image12).   Retroflexed views in the rectum revealed     no abnormalities.    The time to cecum =  5  minutes. The scope     was then withdrawn (time =  7.45  min) from the patient and the     procedure completed.           COMPLICATIONS:  None        ENDOSCOPIC IMPRESSION:     1) 3 mm diminutive polyp in the sigmoid colon     2) Otherwise normal examination           Limited rectal bleeding secondary to hemorrhoids           RECOMMENDATIONS:     1) If the polyp(s) removed today are proven to be adenomatous     (pre-cancerous) polyps, you will need a repeat colonoscopy in 5     years. Otherwise you should continue to follow colorectal cancer     screening guidelines for "routine risk" patients with colonoscopy     in 10 years.  REPEAT EXAM: You will receive a letter from Dr. Arlyce Dice in 1-2     weeks, after reviewing the final pathology, with followup     recommendations.           ______________________________     Barbette Hair Arlyce Dice, MD           CC:           n.     eSIGNED:   Barbette Hair. [name] at 09/01/2009 10:36 AM           Adela Ports, [phone]  Note: An exclamation mark (!) indicates a result that was not dispersed into the flowsheet. Document Creation Date: 09/01/2009 10:36 AM _______________________________________________________________________  (1) Order result status: Final Collection or observation date-time: 09/01/2009 10:29 Requested date-time:  Receipt date-time:  Reported date-time:  Referring Physician:   Ordering Physician: Melvia Heaps [phone]) Specimen Source:  Source: [name] Order Number: [id number] Lab site:   Appended Document: Colonoscopy     Procedures Next Due Date:    Colonoscopy: 08/2019

## 2010-06-19 NOTE — Progress Notes (Signed)
Summary: fyi fyi decline ED CHEST TIGHTNESS  Phone Note Call from Patient Call back at Work Phone [phone] Call back at [phone]   Caller: Patient Call For: Loreen Freud DO Reason for Call: Talk to Nurse Summary of Call: PATIENT CALLING, C/O TIGHTNESS IN CHEST, PATIENT BELIEVES IT IS ALLERGY RELATED.  PT HAS BEEN TAKING XYZAL FOR A LONG PERIOD OF TIME & BELIEVES THIS MAY NOT BE WORKING ANYMORE & CAUSING HIS SYMPTOMS.  PT REQUESTING APPT W/DR. LOWNE ASAP, BUT CURRENTLY SCH'D TO SEE DR. HOPPER TOMORROW, 05-23-2009 @ 2:50PM.  FELT THIS PT'S SYMPTOMS SHOULD BE ADDRESSED TODAY.  PLEASE CALL PT @ [phone]. Initial call taken by: Magdalen Spatz [facility],  May 22, 2009 3:08 PM  Follow-up for Phone Call        pt c/o tightness in chest,SOB x2weeks. pt denies any numbness, tingling, dizziness, chest pain or pressure..pt has pending OV tomorrow,advise UC if symptoms worsen or change in any way...................Marland KitchenFelecia Deloach CMA  May 22, 2009 3:22 PM    Additional Follow-up for Phone Call Additional follow up Details #1::        Pt should go to ER tonight Additional Follow-up by: Loreen Freud DO,  May 22, 2009 5:13 PM    Additional Follow-up for Phone Call Additional follow up Details #2::    pt decline ED wants to keep appt for tomorrow. pt states he will monitor chest discomfort and if their is any increase or change will go to ED prior to appt..................Marland KitchenFelecia Deloach CMA  May 22, 2009 5:23 PM

## 2010-06-19 NOTE — Letter (Signed)
Summary: New Patient letter  [facility]  [address] [place], [place] [address]   Phone: [phone]  Fax: [phone]       07/27/2009 MRN: [id number]  [facility] [address] [place], [place]  [address]  Dear Juan Velazquez,  Welcome to the Gastroenterology Division at [facility].    You are scheduled to see Dr. Arlyce Dice on 08-23-09 at 10:45a.m. on the 3rd floor at [facility], [address]  We ask that you try to arrive at our office 15 minutes prior to your appointment time to allow for check-in.  We would like you to complete the enclosed self-administered evaluation form prior to your visit and bring it with you on the day of your appointment.  We will review it with you.  Also, please bring a complete list of all your medications or, if you prefer, bring the medication bottles and we will list them.  Please bring your insurance card so that we may make a copy of it.  If your insurance requires a referral to see a specialist, please bring your referral form from your primary care physician.  Co-payments are due at the time of your visit and may be paid by cash, check or credit card.     Your office visit will consist of a consult with your physician (includes a physical exam), any laboratory testing he/she may order, scheduling of any necessary diagnostic testing (e.g. x-ray, ultrasound, CT-scan), and scheduling of a procedure (e.g. Endoscopy, Colonoscopy) if required.  Please allow enough time on your schedule to allow for any/all of these possibilities.    If you cannot keep your appointment, please call [phone] to cancel or reschedule prior to your appointment date.  This allows [place] the opportunity to schedule an appointment for another patient in need of care.  If you do not cancel or reschedule by 5 p.m. the business day prior to your appointment date, you will be charged a $50.00 late cancellation/no-show fee.    Thank you for  choosing [facility] for your medical needs.  We appreciate the opportunity to care for you.  Please visit [place] at our website  to learn more about our practice.                     Sincerely,                                                             The Gastroenterology Division

## 2010-06-19 NOTE — Assessment & Plan Note (Signed)
Summary: tingleing both hands/cbs   Vital Signs:  Patient profile:   45 year old male Weight:      228.0 pounds Temp:     98.3 degrees F oral Pulse rate:   60 / minute Pulse rhythm:   regular BP sitting:   124 / 72  (right arm)  Vitals Entered By: Almeta Monas CMA Duncan Dull) (February 05, 2010 1:25 PM) CC: numbness and tingling in R hand  Is Patient Diabetic? No   History of Present Illness: Pt here c/o numbness in R hand 3rd-5th fingers---finger tips only.  No neck or arm pain.  Notices it at rest. Pt first noticed it Saturday.  Current Medications (verified): 1)  Xyzal 5 Mg Tabs (Levocetirizine Dihydrochloride) .... Take 1 Tab Once Daily 2)  Lipitor 10 Mg Tabs (Atorvastatin Calcium) .... Take 1 Tab Once Daily 3)  Viagra 100 Mg Tabs (Sildenafil Citrate) .... As Directed.  Allergies (verified): No Known Drug Allergies  Past History:  Past Medical History: Last updated: 08/23/2009 Hyperlipidemia Allergic rhinitis Asthma  Past Surgical History: Last updated: 05/23/2009 Denies surgical history  Family History: Last updated: 06/06/2008 Family History Diabetes 1st degree relative--M brother ---DMII sister --DM MAunt-- Colon Cancer Family History Hypertension  Social History: Last updated: 06/06/2008 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 Smoked Alcohol use-no Drug use-no Regular exercise-yes  Risk Factors: Alcohol Use: 0 (06/07/2009) Caffeine Use: 0 (06/07/2009) Exercise: yes (06/07/2009)  Risk Factors: Smoking Status: never (06/07/2009) Passive Smoke Exposure: no (06/07/2009)  Family History: Reviewed history from 06/06/2008 and no changes required. Family History Diabetes 1st degree relative--M brother ---DMII sister --DM MAunt-- Colon Cancer Family History Hypertension  Social History: Reviewed history from 06/06/2008 and no changes required. 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 Smoked Alcohol use-no Drug use-no Regular  exercise-yes  Review of Systems      See HPI  Physical Exam  General:  Well-developed,well-nourished,in no acute distress; alert,appropriate and cooperative throughout examination Neck:  No deformities, masses, or tenderness noted. Msk:  normal ROM, no joint tenderness, no joint swelling, no joint warmth, no redness over joints, no joint deformities, no joint instability, and no crepitation.   Pulses:  R radial normal and L radial normal.   Skin:  Intact without suspicious lesions or rashes Psych:  Oriented X3 and normally interactive.     Impression & Recommendations:  Problem # 1:  NUMBNESS, HAND (ICD-782.0)  Orders: Venipuncture (78295) TLB-B12 + Folate Pnl (62130_86578-I69/GEX) TLB-IBC Pnl (Iron/FE;Transferrin) (83550-IBC) TLB-CBC Platelet - w/Differential (85025-CBCD) TLB-TSH (Thyroid Stimulating Hormone) (52841-LKG) Orthopedic Surgeon Referral (Ortho Surgeon) Specimen Handling (40102)  Complete Medication List: 1)  Xyzal 5 Mg Tabs (Levocetirizine dihydrochloride) .... Take 1 tab once daily 2)  Lipitor 10 Mg Tabs (Atorvastatin calcium) .... Take 1 tab once daily 3)  Viagra 100 Mg Tabs (Sildenafil citrate) .... As directed.

## 2010-06-19 NOTE — Letter (Signed)
Summary: Results Letter  [facility]  [address] [place], [place] [address]   Phone: [phone]  Fax: [phone]        August 23, 2009 MRN: [id number]    Juan Velazquez [address] [place], [place]  [address]    Dear Juan Velazquez,  It is my pleasure to have treated you recently as a new patient in my office. I appreciate your confidence and the opportunity to participate in your care.  Since I do have a busy inpatient endoscopy schedule and office schedule, my office hours vary weekly. I am, however, available for emergency calls everyday through my office. If I am not available for an urgent office appointment, another one of our gastroenterologist will be able to assist you.  My well-trained staff are prepared to help you at all times. For emergencies after office hours, a physician from our Gastroenterology section is always available through my 24 hour answering service  Once again I welcome you as a new patient and I look forward to a happy and healthy relationship             Sincerely,  Louis Meckel MD  This letter has been electronically signed by your physician.  Appended Document: Results Letter Letter mailed to patient.

## 2010-06-19 NOTE — Progress Notes (Signed)
Summary: New rx  Phone Note Call from Patient   Caller: Patient Summary of Call: Pt is calling and would like a rx for Viagara. Please advise. [facility] [address] Army Fossa CMA  July 19, 2009 2:07 PM   Follow-up for Phone Call        viagra 100mg   #8  as directed  2 refills Follow-up by: Loreen Freud DO,  July 19, 2009 2:51 PM  Additional Follow-up for Phone Call Additional follow up Details #1::        LMTCB.Army Fossa CMA  July 19, 2009 3:42 PM     Additional Follow-up for Phone Call Additional follow up Details #2::    Pt is aware.Army Fossa CMA  July 20, 2009 10:56 AM   New/Updated Medications: VIAGRA 100 MG TABS (SILDENAFIL CITRATE) as directed. Prescriptions: VIAGRA 100 MG TABS (SILDENAFIL CITRATE) as directed.  #8 x 0   Entered by:   Army Fossa CMA   Authorized by:   Loreen Freud DO   Signed by:   Army Fossa CMA on 07/19/2009   Method used:   [facility] [phone][facility] (retail)       [address]       [place], [place]  [address]       Ph: [phone] or [phone]       Fax: [phone]   RxID:   [id number]

## 2010-06-19 NOTE — Progress Notes (Signed)
----   Converted from flag ---- ---- 07/27/2009 11:22 AM, Loreen Freud DO wrote: I forgot to give him prilosec otc ----would you call him and tell him to take prilosec or prevacid daily until sees GI?  sorry. ------------------------------  Called and the pt 2 message to call the office back to inform him of this.   Appended Document:  Pt is aware.

## 2010-06-19 NOTE — Letter (Signed)
Summary: Primary Care Consult Scheduled Letter  [facility]  [address] [place], [place] [address]   Phone: [phone]  Fax: [phone]      06/07/2009 MRN: [id number]  [facility] [address] [place], [place]  [address]    Dear Mr. Forner,    We have scheduled an appointment for you.  At the recommendation of Dr. Loreen Freud, we have scheduled you a consult with Dr. Jetty Duhamel with [facility] on 07-11-2009 arrive by 2:15pm for a Sleep Consult, and at 3:15 for an Allergy Consult.  Their address is [address] 2nd floor, [place] [address]. The office phone number is [phone].  If this appointment day and time is not convenient for you, please feel free to call the office of the doctor you are being referred to at the number listed above and reschedule the appointment.    It is important for you to keep your scheduled appointments. We are here to make sure you are given good patient care.   Thank you,    Renee, Patient Care Coordinator [facility] [facility]    **IF YOU ARE UNABLE TO KEEP THESE APPOINTMENTS, OR NEED TO RESCHEDULE, PLEASE GIVE  PULMONARY A 24 HOUR NOTICE TO AVOID A $50 FEE**

## 2010-06-19 NOTE — Letter (Signed)
Summary: [facility] Instructions  [facility]  [address] [place], [place] [address]   Phone: [phone]  Fax: [phone]       Juan Velazquez    Dec 18, 1965    MRN: [id number]      Procedure Day Dorna [place]: Farrell Ours, 09/01/09     Arrival Time: 9:00 AM      Procedure Time: 10:00 AM    Location of Procedure:                    _X_  [facility] (4th Floor)                      PREPARATION FOR COLONOSCOPY WITH MOVIPREP   Starting 5 days prior to your procedure 08/27/09 do not eat nuts, seeds, popcorn, corn, beans, peas,  salads, or any raw vegetables.  Do not take any fiber supplements (e.g. Metamucil, Citrucel, and Benefiber).  THE DAY BEFORE YOUR PROCEDURE         THURSDAY, 08/31/09  1.  Drink clear liquids the entire day-NO SOLID FOOD  2.  Do not drink anything colored red or purple.  Avoid juices with pulp.  No orange juice.  3.  Drink at least 64 oz. (8 glasses) of fluid/clear liquids during the day to prevent dehydration and help the prep work efficiently.  CLEAR LIQUIDS INCLUDE: Water Jello Ice Popsicles Tea (sugar ok, no milk/cream) Powdered fruit flavored drinks Coffee (sugar ok, no milk/cream) Gatorade Juice: apple, white grape, white cranberry  Lemonade Clear bullion, consomm, broth Carbonated beverages (any kind) Strained chicken noodle soup Hard Candy                             4.  In the morning, mix first dose of MoviPrep solution:    Empty 1 Pouch A and 1 Pouch B into the disposable container    Add lukewarm drinking water to the top line of the container. Mix to dissolve    Refrigerate (mixed solution should be used within 24 hrs)  5.  Begin drinking the prep at 5:00 p.m. The MoviPrep container is divided by 4 marks.   Every 15 minutes drink the solution down to the next mark (approximately 8 oz) until the full liter is complete.   6.  Follow completed prep with 16 oz of clear liquid of your choice (Nothing red or  purple).  Continue to drink clear liquids until bedtime.  7.  Before going to bed, mix second dose of MoviPrep solution:    Empty 1 Pouch A and 1 Pouch B into the disposable container    Add lukewarm drinking water to the top line of the container. Mix to dissolve    Refrigerate  THE DAY OF YOUR PROCEDURE      FRIDAY, 09/01/09  Beginning at 5:00 a.m. (5 hours before procedure):         1. Every 15 minutes, drink the solution down to the next mark (approx 8 oz) until the full liter is complete.  2. Follow completed prep with 16 oz. of clear liquid of your choice.    3. You may drink clear liquids until 8:00 AM (2 HOURS BEFORE PROCEDURE).  MEDICATION INSTRUCTIONS  Unless otherwise instructed, you should take regular prescription medications with a small sip of water   as early as possible the morning of your procedure.        OTHER INSTRUCTIONS  You will need a responsible adult at least 44 years of age to accompany you and drive you home.   This person must remain in the waiting room during your procedure.  Wear loose fitting clothing that is easily removed.  Leave jewelry and other valuables at home.  However, you may wish to bring a book to read or  an iPod/MP3 player to listen to music as you wait for your procedure to start.  Remove all body piercing jewelry and leave at home.  Total time from sign-in until discharge is approximately 2-3 hours.  You should go home directly after your procedure and rest.  You can resume normal activities the  day after your procedure.  The day of your procedure you should not:   Drive   Make legal decisions   Operate machinery   Drink alcohol   Return to work  You will receive specific instructions about eating, activities and medications before you leave.  The above instructions have been reviewed and explained to me by   [facility], CMA  I fully understand and can verbalize these instructions  _____________________________ Date _________

## 2010-06-19 NOTE — Progress Notes (Signed)
Summary: change to cheaper med   Phone Note Refill Request Message from:  Fax from Pharmacy on November 22, 2009 9:23 AM  Refills Requested: Medication #1:  VYTORIN 10-20 MG TABS 1 by mouth once daily. NOTE [facility] [place] [facility] fax [phone] patient would like to change to something cheaper than the vytorin - could we change it to simvastatin  Initial call taken by: Okey Regal [place],  November 22, 2009 9:29 AM  Follow-up for Phone Call        PLS ADVISE ON MED ALTERNATIVE........Marland KitchenFelecia Deloach CMA  November 22, 2009 9:33 AM   Additional Follow-up for Phone Call Additional follow up Details #1::        I would rather change to Lipitor 10 mg #30  2 refills--- give coupon for 30 days free and the $4 copay card. Additional Follow-up by: Loreen Freud DO,  November 22, 2009 9:56 AM    Additional Follow-up for Phone Call Additional follow up Details #2::    pt aware, rx sent to [facility] for 30 days free and the $4 copay card  placed up front for pickup....................Marland KitchenFelecia Deloach CMA  November 22, 2009 1:26 PM   New/Updated Medications: LIPITOR 10 MG TABS (ATORVASTATIN CALCIUM) take 1 tab once daily Prescriptions: LIPITOR 10 MG TABS (ATORVASTATIN CALCIUM) take 1 tab once daily  #30 x 2   Entered by:   Jeremy Johann CMA   Authorized by:   Loreen Freud DO   Signed by:   Jeremy Johann CMA on 11/22/2009   Method used:   [facility]ed [facility] ...       [facility] [address] (retail)       [address]       [place], [place]  [address]       Ph: [phone] or [phone]       Fax: [phone]   RxID:   [id number]

## 2010-06-19 NOTE — Progress Notes (Signed)
Summary: lab appt   Phone Note Outgoing Call   Call placed by: Army Fossa CMA,  November 10, 2009 7:56 AM Reason for Call: Confirm/change Appt Summary of Call: Pt needs labwork:    lipid, hep 272.4

## 2010-06-19 NOTE — Progress Notes (Signed)
Summary: Starting lipitor -fyi  Phone Note Call from Patient   Summary of Call: Pt reviewed his labs and is wondering if he should go back on his lipitor now or if you think he should hold off until he rechecks his labs in 3 months, since his ldl his high now. Army Fossa CMA  June 14, 2009 10:57 AM    Follow-up for Phone Call        I'll wait for the referrals and stress test--- work on diet and exercise----  since cpk is elevated i want to wait until everything comes back. Follow-up by: Loreen Freud DO,  June 14, 2009 11:07 AM  Additional Follow-up for Phone Call Additional follow up Details #1::        Pt is going to have all referrals done in march due to appt of past appts. Army Fossa CMA  June 14, 2009 11:29 AM     Additional Follow-up for Phone Call Additional follow up Details #2::    ?      yrlownedo  06/14/2009  751p  Additional Follow-up for Phone Call Additional follow up Details #3:: Details for Additional Follow-up Action Taken: He cannot do the stress test this month due to all the appts he had the previous month. But he will get it done. Army Fossa CMA  June 15, 2009 7:57 AM  Pt cancelled all appointment--- please call and find out why.   yrlownedo  06/15/2009 1230  Left message for pt to call back. Army Fossa CMA  June 15, 2009 1:07 PM  Pt states because he is not able to get off work- would like rescheulded in march. Army Fossa CMA  June 15, 2009 2:28 PM  If pt knows schedule he can call and make appointments since referrals done.  Pt is aware he will call and schedule appts. Army Fossa CMA  June 15, 2009 3:54 PM  Additional Follow-up by: Loreen Freud DO,  June 15, 2009 2:43 PM

## 2010-06-19 NOTE — Letter (Signed)
Summary: Faxes Regarding Lab Results/Call-a-Nurse  Faxes Regarding Lab Results/Call-a-Nurse   Imported By: Lanelle Bal 05/31/2009 15:35:37  _____________________________________________________________________  External Attachment:    Type:   Image     Comment:   External Document

## 2010-06-19 NOTE — Progress Notes (Signed)
Summary: PEER TO PEER REQUIRED  Phone Note Outgoing Call Call back at [facility] [phone]   Call placed by: Magdalen Spatz [facility],  June 14, 2009 5:01 PM Call placed to: Database administrator of Call: IN REF TO CARDIO STRESS TEST, I WAS NOT ABLE TO GET APPROVED BY PHONE ON 06-08-2009.  I HAD TO FAX CLINICALS, LABS, ETC... TO [facility] CLINICAL REVIEW.  AS OF TODAY, THEY STILL WILL NOT APPROVE, AND THE CASE IS AWAITING A PEER TO PEER REVIEW.  WILL NEED TO CALL [facility] [phone], AND REFERENCE CASE# [id number].  DR. Laury Axon, IF PT WILL NOT HAVE STRESS TEST THIS MONTH, AND WILL NOT DO UNTIL MARCH, WE WILL HAVE TO WAIT & OPEN A NEW CASE LATER IN [place]. Initial call taken by: Magdalen Spatz [facility],  June 14, 2009 5:02 PM  Follow-up for Phone Call        pt cancelled all appointments anyway Follow-up by: Loreen Freud DO,  June 15, 2009 12:58 PM

## 2010-06-19 NOTE — Assessment & Plan Note (Signed)
Summary: CPX/NS/KDC   Vital Signs:  Patient profile:   45 year old male Height:      71.75 inches Weight:      226.50 pounds Temp:     98.2 degrees F oral Pulse rate:   63 / minute Pulse rhythm:   regular BP sitting:   120 / 74  (left arm) Cuff size:   large  Vitals Entered By: Army Fossa CMA (June 07, 2009 8:34 AM) CC: CPX, no complaints. Med list reviewed.    History of Present Illness: Juan Velazquez here for cpe and labs.   Juan Velazquez complaints of dry patches of skin on ankle---very itchy---symptoms for about 1 month.   Juan Velazquez is better since last visit with breathing---- inhaler helps---He never has any problems working out--it happens at home or  at work.  Juan Velazquez not aware of anything new that triggers it.   Juan Velazquez also concerned about sleep apnea.  He says he doesn't snore but breathes heavy and he found himself gasping for air one night.       Preventive Screening-Counseling & Management  Alcohol-Tobacco     Alcohol drinks/day: 0     Smoking Status: never     Passive Smoke Exposure: no  Caffeine-Diet-Exercise     Caffeine use/day: 0     Does Patient Exercise: yes     Type of exercise: weight, aerobic   2 hours     Times/week: 3  Hep-HIV-STD-Contraception     HIV Risk: no     Dental Visit-last 6 months yes     Dental Care Counseling: not indicated; dental care within six months     TSE monthly: no     Testicular SE Education/Counseling to perform regular STE  Safety-Violence-Falls     Seat Belt Use: 100      Sexual History:  currently monogamous and married.    Current Medications (verified): 1)  Xyzal 5 Mg Tabs (Levocetirizine Dihydrochloride) .... Take 1 Tab Once Daily 2)  Singulair 10 Mg Tabs (Montelukast Sodium) .Marland Kitchen.. 1 Once Daily 3)  Proventil Hfa 108 (90 Base) Mcg/act Aers (Albuterol Sulfate) .Marland Kitchen.. 1-2 Puffs Every 4 Hours As Needed 4)  Lotrisone 1-0.05 % Crea (Clotrimazole-Betamethasone) .... Apply Two Times A Day  Allergies (verified): No Known Drug Allergies  Past  History:  Past Medical History: Last updated: 05/23/2009 Hyperlipidemia Allergic rhinitis  Past Surgical History: Last updated: 05/23/2009 Denies surgical history  Family History: Last updated: 06/06/2008 Family History Diabetes 1st degree relative--M brother ---DMII sister --DM MAunt-- Colon Cancer Family History Hypertension  Social History: Last updated: 06/06/2008 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 Smoked Alcohol use-no Drug use-no Regular exercise-yes  Risk Factors: Alcohol Use: 0 (06/07/2009) Caffeine Use: 0 (06/07/2009) Exercise: yes (06/07/2009)  Risk Factors: Smoking Status: never (06/07/2009) Passive Smoke Exposure: no (06/07/2009)  Family History: Reviewed history from 06/06/2008 and no changes required. Family History Diabetes 1st degree relative--M brother ---DMII sister --DM MAunt-- Colon Cancer Family History Hypertension  Social History: Reviewed history from 06/06/2008 and no changes required. 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 Smoked Alcohol use-no Drug use-no Regular exercise-yes Dental Care w/in 6 mos.:  yes Sexual History:  currently monogamous, married  Review of Systems      See HPI General:  Denies chills, fatigue, fever, loss of appetite, malaise, sleep disorder, sweats, weakness, and weight loss. Eyes:  Denies blurring, discharge, double vision, eye irritation, eye pain, halos, itching, light sensitivity, red eye, vision loss-1 eye, and vision loss-both eyes; optho--q1y--contacts. ENT:  Denies decreased hearing,  difficulty swallowing, ear discharge, earache, hoarseness, nasal congestion, nosebleeds, postnasal drainage, ringing in ears, sinus pressure, and sore throat. CV:  Denies bluish discoloration of lips or nails, chest pain or discomfort, difficulty breathing at night, difficulty breathing while lying down, fainting, fatigue, leg cramps with exertion, lightheadness, near fainting, palpitations, shortness of  breath with exertion, swelling of feet, swelling of hands, and weight gain. Resp:  Denies chest discomfort, chest pain with inspiration, cough, coughing up blood, excessive snoring, hypersomnolence, morning headaches, pleuritic, shortness of breath, sputum productive, and wheezing. GI:  Denies abdominal pain, bloody stools, change in bowel habits, constipation, dark tarry stools, diarrhea, excessive appetite, gas, hemorrhoids, indigestion, loss of appetite, and nausea. GU:  Denies decreased libido, discharge, dysuria, erectile dysfunction, genital sores, hematuria, incontinence, nocturia, urinary frequency, and urinary hesitancy. MS:  Denies joint pain, joint redness, joint swelling, loss of strength, low back pain, mid back pain, muscle aches, muscle , cramps, muscle weakness, stiffness, and thoracic pain. Derm:  Denies changes in color of skin, changes in nail beds, dryness, excessive perspiration, flushing, hair loss, insect bite(s), itching, lesion(s), poor wound healing, and rash. Neuro:  Denies brief paralysis, difficulty with concentration, disturbances in coordination, falling down, headaches, inability to speak, memory loss, numbness, poor balance, seizures, sensation of room spinning, tingling, tremors, visual disturbances, and weakness. Psych:  Denies alternate hallucination ( auditory/visual), anxiety, depression, easily angered, easily tearful, irritability, mental problems, panic attacks, sense of great danger, suicidal thoughts/plans, thoughts of violence, unusual visions or sounds, and thoughts /plans of harming others. Endo:  Denies cold intolerance, excessive hunger, excessive thirst, excessive urination, heat intolerance, polyuria, and weight change. Heme:  Denies abnormal bruising, bleeding, enlarge lymph nodes, fevers, pallor, and skin discoloration. Allergy:  Denies hives or rash, itching eyes, persistent infections, seasonal allergies, and sneezing.  Physical Exam  General:   Well-developed,well-nourished,in no acute distress; alert,appropriate and cooperative throughout examination Head:  Normocephalic and atraumatic without obvious abnormalities. No apparent alopecia or balding. Eyes:  vision grossly intact, pupils equal, pupils round, pupils reactive to light, and no injection.   Ears:  External ear exam shows no significant lesions or deformities.  Otoscopic examination reveals clear canals, tympanic membranes are intact bilaterally without bulging, retraction, inflammation or discharge. Hearing is grossly normal bilaterally. Nose:  External nasal examination shows no deformity or inflammation. Nasal mucosa are pink and moist without lesions or exudates. Mouth:  Oral mucosa and oropharynx without lesions or exudates.  Teeth in good repair. Neck:  No deformities, masses, or tenderness noted. Chest Wall:  No deformities, masses, tenderness or gynecomastia noted. Breasts:  No masses or gynecomastia noted Lungs:  Normal respiratory effort, chest expands symmetrically. Lungs are clear to auscultation, no crackles or wheezes. Heart:  normal rate and no murmur.   Abdomen:  Bowel sounds positive,abdomen soft and non-tender without masses, organomegaly or hernias noted. Rectal:  No external abnormalities noted. Normal sphincter tone. No rectal masses or tenderness. Genitalia:  Testes bilaterally descended without nodularity, tenderness or masses. No scrotal masses or lesions. No penis lesions or urethral discharge. Prostate:  Prostate gland firm and smooth, no enlargement, nodularity, tenderness, mass, asymmetry or induration. Msk:  normal ROM, no joint tenderness, no joint swelling, no joint warmth, no redness over joints, no joint deformities, no joint instability, and no crepitation.   Pulses:  R posterior tibial normal, R dorsalis pedis normal, R carotid normal, L posterior tibial normal, L dorsalis pedis normal, and L carotid normal.   Extremities:  No clubbing,  cyanosis, edema,  or deformity noted with normal full range of motion of all joints.   Neurologic:  No cranial nerve deficits noted. Station and gait are normal. Plantar reflexes are down-going bilaterally. DTRs are symmetrical throughout. Sensory, motor and coordinative functions appear intact. Skin:  Intact without suspicious lesions or rashes Cervical Nodes:  No lymphadenopathy noted Axillary Nodes:  No palpable lymphadenopathy Psych:  Cognition and judgment appear intact. Alert and cooperative with normal attention span and concentration. No apparent delusions, illusions, hallucinations    Impression & Recommendations:  Problem # 1:  PREVENTIVE HEALTH CARE (ICD-V70.0)  Orders: Venipuncture (16109) TLB-Lipid Panel (80061-LIPID) TLB-BMP (Basic Metabolic Panel-BMET) (80048-METABOL) TLB-CBC Platelet - w/Differential (85025-CBCD) TLB-Hepatic/Liver Function Pnl (80076-HEPATIC) TLB-TSH (Thyroid Stimulating Hormone) (84443-TSH) TLB-PSA (Prostate Specific Antigen) (84153-PSA) EKG w/ Interpretation (93000)  Reviewed preventive care protocols, scheduled due services, and updated immunizations.  Problem # 2:  ALLERGIC RHINITIS (ICD-477.9)  His updated medication list for this problem includes:    Xyzal 5 Mg Tabs (Levocetirizine dihydrochloride) .Marland Kitchen... Take 1 tab once daily  Orders: Venipuncture (60454) TLB-Lipid Panel (80061-LIPID) TLB-BMP (Basic Metabolic Panel-BMET) (80048-METABOL) TLB-CBC Platelet - w/Differential (85025-CBCD) TLB-Hepatic/Liver Function Pnl (80076-HEPATIC) TLB-TSH (Thyroid Stimulating Hormone) (84443-TSH) TLB-PSA (Prostate Specific Antigen) (84153-PSA) Allergy Referral  (Allergy) EKG w/ Interpretation (93000)  Discussed use of allergy medications and environmental measures.   Problem # 3:  HYPERLIPIDEMIA (ICD-272.4)  Orders: Venipuncture (09811) TLB-Lipid Panel (80061-LIPID) TLB-BMP (Basic Metabolic Panel-BMET) (80048-METABOL) TLB-CBC Platelet -  w/Differential (85025-CBCD) TLB-Hepatic/Liver Function Pnl (80076-HEPATIC) TLB-TSH (Thyroid Stimulating Hormone) (84443-TSH) TLB-PSA (Prostate Specific Antigen) (84153-PSA) EKG w/ Interpretation (93000)  Labs Reviewed: SGOT: 30 (11/02/2008)   SGPT: 45 (11/02/2008)   HDL:47.20 (11/02/2008), 48.6 (06/06/2008)  LDL:119 (11/02/2008), DEL (06/06/2008)  Chol:186 (11/02/2008), 274 (06/06/2008)  Trig:98.0 (11/02/2008), 101 (06/06/2008)  Problem # 4:  TINEA CORPORIS (ICD-110.5)  lotrisone two times a day   Orders: EKG w/ Interpretation (93000)  Problem # 5:  CHEST PAIN (ICD-786.50) Assessment: Improved resolved but cpk elevated----repeat Orders: TLB-Cardiac Panel ([id number])  Complete Medication List: 1)  Xyzal 5 Mg Tabs (Levocetirizine dihydrochloride) .... Take 1 tab once daily 2)  Singulair 10 Mg Tabs (Montelukast sodium) .Marland Kitchen.. 1 once daily 3)  Proventil Hfa 108 (90 Base) Mcg/act Aers (Albuterol sulfate) .Marland Kitchen.. 1-2 puffs every 4 hours as needed 4)  Lotrisone 1-0.05 % Crea (Clotrimazole-betamethasone) .... Apply two times a day Prescriptions: LOTRISONE 1-0.05 % CREA (CLOTRIMAZOLE-BETAMETHASONE) apply two times a day  #45g x 1   Entered and Authorized by:   Loreen Freud DO   Signed by:   Loreen Freud DO on 06/07/2009   Method used:   Electronically to        [facility] [phone]* (retail)       [address]       [place], [place]  [address]       Ph: [phone] or [phone]       Fax: [phone]   RxID:   [id number]    EKG  Procedure date:  06/07/2009  Findings:      Normal sinus rhythm with rate of:  63 bpm      Flu Vaccine Next Due:  Refused

## 2010-06-19 NOTE — Letter (Signed)
Summary: Primary Care Consult Scheduled Letter  [facility]  [address] [place], [place] [address]   Phone: [phone]  Fax: [phone]      07/27/2009 MRN: [id number]  [facility] [address] [place], [place]  [address]    Dear Mr. Callari,    We have scheduled an appointment for you.  At the recommendation of Dr. Loreen Freud, we have scheduled you a consult with Dr. Melvia Heaps of [facility] on 08-23-2009 at 10:45am. (YOU ARE ON A CANCELLATION LIST FOR A SOONER APPOINTMENT)  Their address is [address], [address] floor, [place] [address]. The office phone number is [phone].  If this appointment day and time is not convenient for you, please feel free to call the office of the doctor you are being referred to at the number listed above and reschedule the appointment.    It is important for you to keep your scheduled appointments. We are here to make sure you are given good patient care.   Thank you,    Renee, Patient Care Coordinator [facility]    ****IF YOU ARE UNABLE TO KEEP THIS APPOINTMENT, OR NEED TO RESCHEDULE, PLEASE GIVE DR. KAPLAN'S OFFICE A 24 HOUR NOTICE TO AVOID A $50 FEE****

## 2010-06-19 NOTE — Progress Notes (Signed)
Summary: FAILED REFERRAL(S)  Phone Note Other Incoming   Summary of Call: IN REFERENCE TO ALLERGY, SLEEP CONSULT, AND RHEUMATOLOGY REFERRALS, PT WAS GIVEN APPTS FOR ALL THESE.  PATIENT HAS CONTACTED ALL THOSE OFFICES & CANCELLED HIS APPTS, AND DID NOT RESCHEDULE THEM. Initial call taken by: Magdalen Spatz [facility],  June 15, 2009 10:24 AM  Follow-up for Phone Call        see other phone note Follow-up by: Loreen Freud DO,  June 15, 2009 2:49 PM

## 2010-06-20 ENCOUNTER — Telehealth (INDEPENDENT_AMBULATORY_CARE_PROVIDER_SITE_OTHER): Payer: Self-pay | Admitting: *Deleted

## 2010-06-21 NOTE — Assessment & Plan Note (Signed)
Summary: back pain radiating down rt leg///sph   Vital Signs:  Patient profile:   45 year old male Weight:      223.8 pounds BMI:     33.17 Temp:     98.4 degrees F oral BP sitting:   118 / 68  (right arm) Cuff size:   large  Vitals Entered By: Almeta Monas CMA Duncan Dull) (June 01, 2010 2:12 PM) CC: c/o 7/10 lbp that radiates down the hip and leg xa few days, Back Pain   History of Present Illness:       This is a 45 year old man who presents with Back Pain.  The symptoms began 2 weeks ago.  The patient denies fever, chills, weakness, loss of sensation, fecal incontinence, urinary incontinence, urinary retention, dysuria, rest pain, inability to work, and inability to care for self.  The pain is located in the mid low back.  The pain began at home, gradually, and after lifting.  The pain radiates to the right hip and right leg below the knee.  The pain is made worse by standing or walking.  The pain is made better by inactivity and NSAID medications.    Current Medications (verified): 1)  Xyzal 5 Mg Tabs (Levocetirizine Dihydrochloride) .... Take 1 Tab Once Daily 2)  Lipitor 10 Mg Tabs (Atorvastatin Calcium) .... Take 1 Tab Once Daily **labs Due Now** 3)  Proventil Hfa 108 (90 Base) Mcg/act Aers (Albuterol Sulfate) .... Use As Needed 4)  Qvar 40 Mcg/act Aers (Beclomethasone Dipropionate) .Marland Kitchen.. 1 Puff Two Times A Day  Allergies (verified): No Known Drug Allergies  Past History:  Past medical, surgical, family and social histories (including risk factors) reviewed for relevance to current acute and chronic problems.  Past Medical History: Reviewed history from 08/23/2009 and no changes required. Hyperlipidemia Allergic rhinitis Asthma  Past Surgical History: Reviewed history from 05/23/2009 and no changes required. Denies surgical history  Family History: Reviewed history from 06/06/2008 and no changes required. Family History Diabetes 1st degree relative--M brother  ---DMII sister --DM MAunt-- Colon Cancer Family History Hypertension  Social History: Reviewed history from 06/06/2008 and no changes required. 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 Smoked Alcohol use-no Drug use-no Regular exercise-yes  Review of Systems      See HPI  Physical Exam  General:  Well-developed,well-nourished,in no acute distress; alert,appropriate and cooperative throughout examination Msk:  normal ROM, no joint tenderness, no joint swelling, no joint warmth, no redness over joints, no joint deformities, and no joint instability.   Extremities:  No clubbing, cyanosis, edema, or deformity noted with normal full range of motion of all joints.   Neurologic:  alert & oriented X3, strength normal in all extremities, gait normal, and DTRs symmetrical and normal.   Psych:  Cognition and judgment appear intact. Alert and cooperative with normal attention span and concentration. No apparent delusions, illusions, hallucinations   Impression & Recommendations:  Problem # 1:  BACK PAIN (ICD-724.5) Assessment Improved  Discussed use of moist heat or ice, modified activities, medications, and stretching/strengthening exercises. Back care instructions given. To be seen in 2 weeks if no improvement; sooner if worsening of symptoms.   Complete Medication List: 1)  Xyzal 5 Mg Tabs (Levocetirizine dihydrochloride) .... Take 1 tab once daily 2)  Lipitor 10 Mg Tabs (Atorvastatin calcium) .... Take 1 tab once daily **labs due now** 3)  Proventil Hfa 108 (90 Base) Mcg/act Aers (Albuterol sulfate) .... Use as needed 4)  Qvar 40 Mcg/act Aers (Beclomethasone dipropionate) .Marland KitchenMarland KitchenMarland Kitchen  1 puff two times a day   Orders Added: 1)  Est. Patient Level III [99213]    Laboratory Results   Urine Tests    Routine Urinalysis   Color: lt. yellow Appearance: Clear Glucose: negative   (Normal Range: Negative) Bilirubin: negative   (Normal Range: Negative) Ketone: negative   (Normal  Range: Negative) Spec. Gravity: <1.005   (Normal Range: 1.003-1.035) Blood: trace-lysed   (Normal Range: Negative) pH: 5.0   (Normal Range: 5.0-8.0) Protein: negative   (Normal Range: Negative) Urobilinogen: 0.2   (Normal Range: 0-1) Nitrite: negative   (Normal Range: Negative) Leukocyte Esterace: negative   (Normal Range: Negative)

## 2010-06-21 NOTE — Assessment & Plan Note (Signed)
Summary: CPX/FASTING/KN   Vital Signs:  Patient profile:   45 year old male Height:      70.5 inches Weight:      219 pounds BMI:     31.09 Temp:     98.0 degrees F oral Pulse rate:   58 / minute Resp:     20 per minute BP sitting:   100 / 62  (left arm)  Vitals Entered By: Jeremy Johann CMA (June 15, 2010 12:59 PM) CC: cpx, fasting   History of Present Illness: Pt here for cpe and labs.     Hyperlipidemia follow-up      This is a 45 year old man who presents for Hyperlipidemia follow-up.  The patient denies muscle aches, GI upset, abdominal pain, flushing, itching, constipation, diarrhea, and fatigue.  The patient denies the following symptoms: chest pain/pressure, exercise intolerance, dypsnea, palpitations, syncope, and pedal edema.  Compliance with medications (by patient report) has been near 100%.  Dietary compliance has been good.  The patient reports exercising 3-4X per week.    Asthma History    Asthma Control Assessment:    Age range: 12+ years    Symptoms: 0-2 days/week    Nighttime Awakenings: 0-2/month    Interferes w/ normal activity: no limitations    SABA use (not for EIB): 0-2 days/week    Asthma Control Assessment: Well Controlled   Preventive Screening-Counseling & Management  Alcohol-Tobacco     Alcohol drinks/day: 0     Smoking Status: never     Passive Smoke Exposure: no  Caffeine-Diet-Exercise     Caffeine use/day: 0     Does Patient Exercise: yes     Type of exercise: weight, aerobic   2 hours     Times/week: 3  Hep-HIV-STD-Contraception     HIV Risk: no     Dental Visit-last 6 months yes     Dental Care Counseling: not indicated; dental care within six months     TSE monthly: no     Testicular SE Education/Counseling to perform regular STE  Safety-Violence-Falls     Seat Belt Use: 100      Sexual History:  currently monogamous and married.        Drug Use:  no.    Problems Prior to Update: 1)  Back Pain  (ICD-724.5) 2)   Asthma, Persistent, Mild  (ICD-493.90) 3)  Numbness, Hand  (ICD-782.0) 4)  Blood in Stool  (ICD-578.1) 5)  Cpk, Abnormal  (ICD-790.5) 6)  Tinea Corporis  (ICD-110.5) 7)  Chest Pain  (ICD-786.50) 8)  Allergic Rhinitis  (ICD-477.9) 9)  External Hemorrhoids  (ICD-455.3) 10)  Rectal Bleeding  (ICD-569.3) 11)  Blurred Vision  (ICD-368.8) 12)  Shoulder Pain, Left  (ICD-719.41) 13)  Allergic Rhinitis, Seasonal  (ICD-477.0) 14)  Lumbar Sprain and Strain  (ICD-847.2) 15)  Foot Pain, Bilateral  (ICD-729.5) 16)  Fatigue  (ICD-780.79) 17)  Family History Diabetes 1st Degree Relative  (ICD-V18.0) 18)  Hyperlipidemia  (ICD-272.4) 19)  Preventive Health Care  (ICD-V70.0)  Medications Prior to Update: 1)  Xyzal 5 Mg Tabs (Levocetirizine Dihydrochloride) .... Take 1 Tab Once Daily 2)  Lipitor 10 Mg Tabs (Atorvastatin Calcium) .... Take 1 Tab Once Daily **labs Due Now** 3)  Proventil Hfa 108 (90 Base) Mcg/act Aers (Albuterol Sulfate) .... Use As Needed 4)  Qvar 40 Mcg/act Aers (Beclomethasone Dipropionate) .Marland Kitchen.. 1 Puff Two Times A Day  Current Medications (verified): 1)  Xyzal 5 Mg Tabs (Levocetirizine Dihydrochloride) .... Take 1 Tab Once  Daily 2)  Lipitor 10 Mg Tabs (Atorvastatin Calcium) .... Take 1 Tab Once Daily **labs Due Now** 3)  Proventil Hfa 108 (90 Base) Mcg/act Aers (Albuterol Sulfate) .... Use As Needed 4)  Qvar 40 Mcg/act Aers (Beclomethasone Dipropionate) .Marland Kitchen.. 1 Puff Two Times A Day  Allergies (verified): No Known Drug Allergies  Past History:  Past Medical History: Last updated: 08/23/2009 Hyperlipidemia Allergic rhinitis Asthma  Past Surgical History: Last updated: 05/23/2009 Denies surgical history  Family History: Last updated: 06/06/2008 Family History Diabetes 1st degree relative--M brother ---DMII sister --DM MAunt-- Colon Cancer Family History Hypertension  Social History: Last updated: 06/06/2008 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  Smoked Alcohol use-no Drug use-no Regular exercise-yes  Risk Factors: Alcohol Use: 0 (06/15/2010) Caffeine Use: 0 (06/15/2010) Exercise: yes (06/15/2010)  Risk Factors: Smoking Status: never (06/15/2010) Passive Smoke Exposure: no (06/15/2010)  Family History: Reviewed history from 06/06/2008 and no changes required. Family History Diabetes 1st degree relative--M brother ---DMII sister --DM MAunt-- Colon Cancer Family History Hypertension  Social History: Reviewed history from 06/06/2008 and no changes required. 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 Smoked Alcohol use-no Drug use-no Regular exercise-yes  Review of Systems      See HPI General:  Denies chills, fatigue, fever, loss of appetite, malaise, sleep disorder, sweats, weakness, and weight loss. Eyes:  Denies blurring, discharge, double vision, eye irritation, eye pain, halos, itching, light sensitivity, red eye, vision loss-1 eye, and vision loss-both eyes; optho q1y---  + contacts. ENT:  Denies decreased hearing, difficulty swallowing, ear discharge, earache, hoarseness, nasal congestion, nosebleeds, postnasal drainage, ringing in ears, sinus pressure, and sore throat. CV:  Denies bluish discoloration of lips or nails, chest pain or discomfort, difficulty breathing at night, difficulty breathing while lying down, fainting, fatigue, leg cramps with exertion, lightheadness, near fainting, palpitations, shortness of breath with exertion, swelling of feet, swelling of hands, and weight gain. Resp:  Denies chest discomfort, chest pain with inspiration, cough, coughing up blood, excessive snoring, hypersomnolence, morning headaches, pleuritic, shortness of breath, sputum productive, and wheezing. GI:  Denies abdominal pain, bloody stools, change in bowel habits, constipation, dark tarry stools, diarrhea, excessive appetite, gas, hemorrhoids, indigestion, loss of appetite, nausea, vomiting, vomiting blood, and yellowish  skin color. GU:  Denies decreased libido, discharge, dysuria, erectile dysfunction, genital sores, hematuria, incontinence, nocturia, urinary frequency, and urinary hesitancy. MS:  Denies joint pain, joint redness, joint swelling, loss of strength, low back pain, mid back pain, muscle aches, muscle , cramps, muscle weakness, stiffness, and thoracic pain. Derm:  Denies changes in color of skin, changes in nail beds, dryness, excessive perspiration, flushing, hair loss, insect bite(s), itching, lesion(s), poor wound healing, and rash. Neuro:  Denies brief paralysis, difficulty with concentration, disturbances in coordination, falling down, headaches, inability to speak, memory loss, numbness, poor balance, seizures, sensation of room spinning, tingling, tremors, visual disturbances, and weakness. Psych:  Denies alternate hallucination ( auditory/visual), anxiety, depression, easily angered, easily tearful, irritability, mental problems, panic attacks, sense of great danger, suicidal thoughts/plans, thoughts of violence, unusual visions or sounds, and thoughts /plans of harming others. Endo:  Denies cold intolerance, excessive hunger, excessive thirst, excessive urination, heat intolerance, polyuria, and weight change. Heme:  Denies abnormal bruising, bleeding, enlarge lymph nodes, fevers, pallor, and skin discoloration. Allergy:  Denies hives or rash, itching eyes, persistent infections, seasonal allergies, and sneezing.  Physical Exam  General:  Well-developed,well-nourished,in no acute distress; alert,appropriate and cooperative throughout examination Head:  Normocephalic and atraumatic without obvious abnormalities. No apparent alopecia or balding.  Eyes:  pupils equal, pupils round, pupils reactive to light, and no injection.   Ears:  External ear exam shows no significant lesions or deformities.  Otoscopic examination reveals clear canals, tympanic membranes are intact bilaterally without bulging,  retraction, inflammation or discharge. Hearing is grossly normal bilaterally. Nose:  External nasal examination shows no deformity or inflammation. Nasal mucosa are pink and moist without lesions or exudates. Mouth:  Oral mucosa and oropharynx without lesions or exudates.  Teeth in good repair. Neck:  No deformities, masses, or tenderness noted.no carotid bruits.   Chest Wall:  No deformities, masses, tenderness or gynecomastia noted. Lungs:  Normal respiratory effort, chest expands symmetrically. Lungs are clear to auscultation, no crackles or wheezes. Heart:  normal rate and no murmur.   Abdomen:  Bowel sounds positive,abdomen soft and non-tender without masses, organomegaly or hernias noted. Rectal:  No external abnormalities noted. Normal sphincter tone. No rectal masses or tenderness. Genitalia:  Testes bilaterally descended without nodularity, tenderness or masses. No scrotal masses or lesions. No penis lesions or urethral discharge. Prostate:  Prostate gland firm and smooth, no enlargement, nodularity, tenderness, mass, asymmetry or induration. Msk:  normal ROM, no joint tenderness, no joint swelling, no joint warmth, no redness over joints, no joint deformities, no joint instability, and no crepitation.   Pulses:  R posterior tibial normal, R dorsalis pedis normal, R carotid normal, L posterior tibial normal, L dorsalis pedis normal, and L carotid normal.   Extremities:  No clubbing, cyanosis, edema, or deformity noted with normal full range of motion of all joints.   Neurologic:  No cranial nerve deficits noted. Station and gait are normal. Plantar reflexes are down-going bilaterally. DTRs are symmetrical throughout. Sensory, motor and coordinative functions appear intact. Skin:  Intact without suspicious lesions or rashes Cervical Nodes:  No lymphadenopathy noted Axillary Nodes:  No palpable lymphadenopathy Psych:  Cognition and judgment appear intact. Alert and cooperative with normal  attention span and concentration. No apparent delusions, illusions, hallucinations   Impression & Recommendations:  Problem # 1:  PREVENTIVE HEALTH CARE (ICD-V70.0)  Orders: Venipuncture ([address]) TLB-Lipid Panel (80061-LIPID) TLB-BMP (Basic Metabolic Panel-BMET) (80048-METABOL) TLB-CBC Platelet - w/Differential (85025-CBCD) TLB-Hepatic/Liver Function Pnl (80076-HEPATIC) TLB-TSH (Thyroid Stimulating Hormone) (84443-TSH) TLB-CK Total Only(Creatine Kinase/CPK) (82550-CK) Specimen Handling (60454) EKG w/ Interpretation (93000)  Reviewed preventive care protocols, scheduled due services, and updated immunizations.  Problem # 2:  HYPERLIPIDEMIA (ICD-272.4)  His updated medication list for this problem includes:    Lipitor 10 Mg Tabs (Atorvastatin calcium) .Marland Kitchen... Take 1 tab once daily **labs due now**  Orders: Venipuncture ([address]11) TLB-Lipid Panel (80061-LIPID) TLB-BMP (Basic Metabolic Panel-BMET) (80048-METABOL) TLB-CBC Platelet - w/Differential (85025-CBCD) TLB-Hepatic/Liver Function Pnl (80076-HEPATIC) TLB-TSH (Thyroid Stimulating Hormone) (84443-TSH) TLB-CK Total Only(Creatine Kinase/CPK) (82550-CK) Specimen Handling (91478) EKG w/ Interpretation (93000)  Labs Reviewed: SGOT: 37 (11/14/2009)   SGPT: 37 (11/14/2009)   HDL:47.40 (11/14/2009), 52.30 (06/07/2009)  LDL:78 (11/14/2009), 119 (29/56/2130)  Chol:145 (11/14/2009), 234 (06/07/2009)  Trig:100.0 (11/14/2009), 104.0 (06/07/2009)  Problem # 3:  CPK, ABNORMAL (ICD-790.5)  Orders: EKG w/ Interpretation (93000)  Labs Reviewed: SGOT: 37 (11/14/2009)   SGPT: 37 (11/14/2009)   HDL:47.40 (11/14/2009), 52.30 (06/07/2009)  LDL:78 (11/14/2009), 119 (86/57/8469)  Chol:145 (11/14/2009), 234 (06/07/2009)  Trig:100.0 (11/14/2009), 104.0 (06/07/2009)  Complete Medication List: 1)  Xyzal 5 Mg Tabs (Levocetirizine dihydrochloride) .... Take 1 tab once daily 2)  Lipitor 10 Mg Tabs (Atorvastatin calcium) .... Take 1 tab once daily **labs  due now** 3)  Proventil Hfa 108 (90 Base) Mcg/act Aers (Albuterol  sulfate) .... Use as needed 4)  Qvar 40 Mcg/act Aers (Beclomethasone dipropionate) .Marland Kitchen.. 1 puff two times a day   Orders Added: 1)  Venipuncture [36415] 2)  TLB-Lipid Panel [80061-LIPID] 3)  TLB-BMP (Basic Metabolic Panel-BMET) [80048-METABOL] 4)  TLB-CBC Platelet - w/Differential [85025-CBCD] 5)  TLB-Hepatic/Liver Function Pnl [80076-HEPATIC] 6)  TLB-TSH (Thyroid Stimulating Hormone) [84443-TSH] 7)  TLB-CK Total Only(Creatine Kinase/CPK) [82550-CK] 8)  Specimen Handling [99000] 9)  Est. Patient 40-64 years [99396] 10)  EKG w/ Interpretation [93000]

## 2010-06-21 NOTE — Assessment & Plan Note (Signed)
Summary: USING INHALE TO MUCH//PH   Vital Signs:  Patient profile:   45 year old male Weight:      222 pounds BMI:     32.90 O2 Sat:      98 % on Room air Pulse rate:   82 / minute BP sitting:   108 / 70  (right arm)  Vitals Entered By: Doristine Devoid CMA (May 18, 2010 9:31 AM)  O2 Flow:  Room air CC: using inhaler more than usual about 2-3x weekly feels chest getting tight   History of Present Illness: 45 yo man here today for increased inhaler use.  has been using Proventil for the last 3-5 yrs no more than 3x/yr.  in the last 6 weeks pt finds himself using inhaler 3x/week.  still using allergy medication.  nothing has changed in routine.  still able to exercise w/out difficulty.  unable to find a pattern to his chest tightness/labored breathing.  chest tightness and breathing improves w/ albuterol use.  had complete PFTs done in Feb through the [place], previously dx'd w/ asthma by the [employer].  has never been on controller med.  Current Medications (verified): 1)  Xyzal 5 Mg Tabs (Levocetirizine Dihydrochloride) .... Take 1 Tab Once Daily 2)  Lipitor 10 Mg Tabs (Atorvastatin Calcium) .... Take 1 Tab Once Daily **labs Due Now** 3)  Viagra 100 Mg Tabs (Sildenafil Citrate) .... As Directed. 4)  Proventil Hfa 108 (90 Base) Mcg/act Aers (Albuterol Sulfate) .... Use As Needed 5)  Qvar 40 Mcg/act Aers (Beclomethasone Dipropionate) .Marland Kitchen.. 1 Puff Two Times A Day  Allergies (verified): No Known Drug Allergies  Review of Systems      See HPI  Physical Exam  General:  Well-developed,well-nourished,in no acute distress; alert,appropriate and cooperative throughout examination Ears:  External ear exam shows no significant lesions or deformities.  Otoscopic examination reveals clear canals, tympanic membranes are intact bilaterally without bulging, retraction, inflammation or discharge. Hearing is grossly normal bilaterally. Nose:  External nasal examination shows no deformity or  inflammation. Nasal mucosa are pink and moist without lesions or exudates. Mouth:  Oral mucosa and oropharynx without lesions or exudates.  Teeth in good repair. Neck:  No deformities, masses, or tenderness noted. Lungs:  Normal respiratory effort, chest expands symmetrically. Lungs are clear to auscultation, no crackles or wheezes. Heart:  normal rate and no murmur.     Impression & Recommendations:  Problem # 1:  ASTHMA, PERSISTENT, MILD (ICD-493.90) Assessment New given pt's increased inhaler use and relief w/ albuterol will add long acting steroid as a controller med.  reviewed appropriate use and asked pt to journal sxs.  has f/u already scheduled w/ PCP. His updated medication list for this problem includes:    Proventil Hfa 108 (90 Base) Mcg/act Aers (Albuterol sulfate) ..... Use as needed    Qvar 40 Mcg/act Aers (Beclomethasone dipropionate) .Marland Kitchen... 1 puff two times a day  Complete Medication List: 1)  Xyzal 5 Mg Tabs (Levocetirizine dihydrochloride) .... Take 1 tab once daily 2)  Lipitor 10 Mg Tabs (Atorvastatin calcium) .... Take 1 tab once daily **labs due now** 3)  Viagra 100 Mg Tabs (Sildenafil citrate) .... As directed. 4)  Proventil Hfa 108 (90 Base) Mcg/act Aers (Albuterol sulfate) .... Use as needed 5)  Qvar 40 Mcg/act Aers (Beclomethasone dipropionate) .Marland Kitchen.. 1 puff two times a day  Patient Instructions: 1)  Use the Qvar daily as a controller medication- start with 1 puff two times a day.  if still not controlled,  increase to 2 puffs two times a day. 2)  Please journal your symptoms and document how many times you are using your rescue inhaler 3)  Call if symptoms worsen 4)  Hang in there! 5)  Happy New Year!   Orders Added: 1)  Est. Patient Level III [84696]

## 2010-06-25 ENCOUNTER — Encounter (INDEPENDENT_AMBULATORY_CARE_PROVIDER_SITE_OTHER): Payer: Self-pay | Admitting: *Deleted

## 2010-06-26 ENCOUNTER — Encounter: Payer: Self-pay | Admitting: Family Medicine

## 2010-06-26 ENCOUNTER — Telehealth (INDEPENDENT_AMBULATORY_CARE_PROVIDER_SITE_OTHER): Payer: Self-pay | Admitting: *Deleted

## 2010-06-26 ENCOUNTER — Ambulatory Visit (INDEPENDENT_AMBULATORY_CARE_PROVIDER_SITE_OTHER): Payer: Federal, State, Local not specified - PPO | Admitting: Family Medicine

## 2010-06-26 DIAGNOSIS — K219 Gastro-esophageal reflux disease without esophagitis: Secondary | ICD-10-CM

## 2010-06-26 DIAGNOSIS — R0789 Other chest pain: Secondary | ICD-10-CM

## 2010-06-26 DIAGNOSIS — E785 Hyperlipidemia, unspecified: Secondary | ICD-10-CM

## 2010-06-27 ENCOUNTER — Encounter (INDEPENDENT_AMBULATORY_CARE_PROVIDER_SITE_OTHER): Payer: Self-pay | Admitting: *Deleted

## 2010-06-29 NOTE — Discharge Summary (Signed)
Juan Velazquez, Juan NO.:  [id number]  MEDICAL RECORD NO.:  [id number]          PATIENT TYPE:  INP  LOCATION:  2507                         FACILITY:  [facility]  PHYSICIAN:  Ruthy Dick, MD    DATE OF BIRTH:  01-Dec-1965  DATE OF ADMISSION:  06/16/2010 DATE OF DISCHARGE:  06/17/2010                              DISCHARGE SUMMARY   REASON FOR ADMISSION:  Epigastric abdominal stroke, atypical chest pain.  FINAL DISCHARGE DIAGNOSES: 1. Epigastric pain. 2. Atypical chest pain. 3. Asthma. 4. Allergic rhinitis. 5. History of dyslipidemia.  CONSULT DURING THIS ADMISSION:  None.  PROCEDURES DONE DURING THIS ADMISSION:  Chest x-ray which showed no acute cardiopulmonary disease.  Primary care physician for this patient is Dr. Loreen Freud with [facility].  BRIEF HISTORY OF PRESENTING ILLNESS AND HOSPITAL COURSE:  This is a 45- year-old gentleman with past medical history significant for asthma, allergic rhinitis, and dyslipidemia who presented with epigastric discomfort and also some atypical chest discomfort.  He was admitted for this reason, was placed on Protonix, and at the same time he had some relief with nitroglycerin.  Because of this, we were not sure whether this was of more cardiac than epigastric but if I would bet my money, I would think this was more epigastric than cardiac.  In any case, we cycled his cardiac enzymes and they all came back normal.  EKG was also nondiagnostic.  Our plan is to go ahead and set him up for outpatient stress test with [facility].  I have discussed this with Dr. Sherryl Manges and he has volunteered and he has offered to help have this arranged for the patient.  A 2-D echo could not be done in the hospital because this was the weekend and the patient was also anxious to go home.  We did not also see what the 2-D echo which shows more than the stress test in the outpatient.  The patient looks reliable  enough to believe that he will go for his stress test and also follow with the primary care physician.  He has no symptoms from yesterday and has not whatsoever today, no chest pain, no shortness of breath, no abdominal pain, no nausea or vomiting, no diarrhea, no constipation.  PHYSICAL EXAMINATION:  VITALS:  Temperature 98.1, pulse 58, respiration 16, blood pressure 100/57, saturating 96% on room air. CHEST:  Clear to auscultation bilaterally. ABDOMEN:  Soft, nontender. EXTREMITIES: No clubbing, no cyanosis, no edema. CARDIOVASCULAR:  First and second heart sounds only. CENTRAL NERVOUS SYSTEM:  Nonfocal.  DISCHARGE MEDICATIONS: 1. Protonix 40 mg p.o. daily. 2. ProAir inhaler 1-2 puffs 4 times daily as needed. 3. Xyzal 5 mg daily.  The patient is to follow up with Dr. Loreen Freud next week and he is to call for this appointment.  He is also to go to [facility] for outpatient stress test as scheduled by [facility].  Total time used for discharging this patient is 29 minutes.     Ruthy Dick, MD     GU/MEDQ  D:  06/17/2010  T:  06/17/2010  Job:  [id number]  cc:   Myrene Buddy  Marjory Lies, DO  Electronically Signed by Virginia Rochester M.D. on 06/29/2010 11:29:18 AM

## 2010-07-03 ENCOUNTER — Telehealth (INDEPENDENT_AMBULATORY_CARE_PROVIDER_SITE_OTHER): Payer: Self-pay | Admitting: *Deleted

## 2010-07-05 NOTE — Letter (Signed)
Summary: Pt Encounter Notification/[place]  Pt Encounter Notification/[place]   Imported By: Sherian Rein 06/26/2010 15:30:01  _____________________________________________________________________  External Attachment:    Type:   Image     Comment:   External Document

## 2010-07-05 NOTE — Letter (Signed)
Summary: Primary Care Consult Scheduled Letter  [place] [facility] [place]  [address] [place], [place] [address]   Phone: [phone]  Fax: [phone]      06/27/2010 MRN: [id number]  [facility] [address] [place], [place]  [address]  [place]    Dear Mr. Turnage,    We have scheduled an appointment for you.  At the recommendation of Dr. Loreen Freud, we have scheduled you a consult with Dr. Arlyce Dice of [facility] on 07-09-2010 at 8:45am.  Their address is [address] [place], [place] [address]. The office phone number is [phone].  If this appointment day and time is not convenient for you, please feel free to call the office of the doctor you are being referred to at the number listed above and reschedule the appointment.    It is important for you to keep your scheduled appointments. We are here to make sure you are given good patient care.   Thank you,    Renee, Patient Care Coordinator  at [facility]

## 2010-07-05 NOTE — Progress Notes (Signed)
Summary: Results lmom--2/1,2/3,2/6  Phone Note Outgoing Call   Call placed by: Almeta Monas CMA Duncan Dull),  June 20, 2010 8:44 AM Call placed to: Patient Details for Reason: cpk better ---still elevated Ideally your LDL (bad cholesterol) should be <__100__, your HDL (good cholesterol) should be >_40__ and your triglycerides should be< 150.  Diet and exercise will increase HDL and decrease the LDL and triglycerides. Read Dr. Danice Goltz book--Eat Drink and Be Healthy.  Increase Lipitor to 20 mg #30  1 by mouth at bedtime,  2 refills.  We will recheck labs in __3_ months.   272.4  lipid, hep   Summary of Call: Left message to call back... Almeta Monas CMA Duncan Dull)  June 20, 2010 8:44 AM Left message to call back.... Almeta Monas CMA Duncan Dull)  June 22, 2010 10:34 AM .Left message to call back.... Almeta Monas CMA Duncan Dull)  June 25, 2010 3:43 PM      Appended Document: Results lmom--2/1,2/3,2/6 letter mailed to have patient contact the office

## 2010-07-05 NOTE — Assessment & Plan Note (Signed)
Summary: hosp F/U   Vital Signs:  Patient profile:   45 year old male Weight:      225.0 pounds Pulse rate:   68 / minute Pulse rhythm:   regular BP sitting:   110 / 70  (right arm) Cuff size:   large  Vitals Entered By: Almeta Monas CMA Duncan Dull) (June 26, 2010 3:01 PM) CC: HOSPT F/U---REVIEW LABS   History of Present Illness: Pt here f/u Hospital for Chest pain.   Pt had an episode of cp worse than any gerd attack he ever had.   See ER and hospital admit and d/c.    Current Medications (verified): 1)  Xyzal 5 Mg Tabs (Levocetirizine Dihydrochloride) .... Take 1 Tab Once Daily 2)  Lipitor 10 Mg Tabs (Atorvastatin Calcium) .Marland Kitchen.. 1 By Mouth At Bedtime 3)  Proventil Hfa 108 (90 Base) Mcg/act Aers (Albuterol Sulfate) .... Use As Needed 4)  Qvar 40 Mcg/act Aers (Beclomethasone Dipropionate) .Marland Kitchen.. 1 Puff Two Times A Day 5)  Pepcid Ac 10 Mg Tabs (Famotidine) .Marland Kitchen.. 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 Family History Diabetes 1st degree relative--M brother ---DMII sister --DM MAunt-- Colon Cancer Family History Hypertension  Social History: Reviewed history from 06/06/2008 and no  changes required. 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 Lungs are clear to auscultation, no crackles or wheezes. Heart:  normal rate and no murmur.   Abdomen:  Bowel sounds positive,abdomen soft and non-tender without masses, organomegaly or hernias noted. Extremities:  No clubbing, cyanosis, edema, or deformity noted with normal full range of motion of all joints.   Psych:  Oriented X3 and normally interactive.     Impression & Recommendations:  Problem # 1:  CHEST PAIN, ATYPICAL (ICD-786.59) probably gerd but will finish w/u recommended by [facility]. Orders: Cardiolite (Cardiolite)  Problem # 2:  GERD (ICD-530.81)  His updated medication list for this problem includes:    Pepcid Ac 10 Mg Tabs (Famotidine) .Marland Kitchen... 1 by mouth once daily  Orders: Gastroenterology Referral (GI)  Diagnostics Reviewed:  Discussed lifestyle modifications, diet, antacids/medications, and preventive measures. Handout provided.   Problem # 3:  HYPERLIPIDEMIA (ICD-272.4)  His updated medication list for this problem includes:    Lipitor 10 Mg Tabs (Atorvastatin calcium) .Marland Kitchen... 1 by mouth at bedtime  Labs Reviewed: SGOT: 34 (06/15/2010)   SGPT: 32 (06/15/2010)   HDL:54.10 (06/15/2010), 47.40 (11/14/2009)  LDL:114 (06/15/2010), 78 (16/02/9603)  Chol:185 (06/15/2010), 145 (11/14/2009)  Trig:86.0 (06/15/2010), 100.0 (11/14/2009)  Complete Medication List: 1)  Xyzal 5 Mg Tabs (Levocetirizine dihydrochloride) .... Take 1 tab once daily 2)  Lipitor 10 Mg Tabs (Atorvastatin calcium) .Marland Kitchen.. 1 by mouth at bedtime  3)  Proventil Hfa 108 (90 Base) Mcg/act Aers (Albuterol sulfate) .... Use as needed 4)  Qvar 40 Mcg/act Aers  (Beclomethasone dipropionate) .Marland Kitchen.. 1 puff two times a day 5)  Pepcid Ac 10 Mg Tabs (Famotidine) .Marland Kitchen.. 1 by mouth once daily  Patient Instructions: 1)  fasting labs 272.4  lipids , hep  3 months  Prescriptions: LIPITOR 10 MG TABS (ATORVASTATIN CALCIUM) 1 by mouth at bedtime  #30 x 2   Entered and Authorized by:   Loreen Freud DO   Signed by:   Loreen Freud DO on 06/26/2010   Method used:   Electronically to        [facility] [phone][facility] (retail)       [address]       [place], [place]  [address]       Ph: [phone] or [phone]       Fax: [phone]   RxID:   [id number] LIPITOR 20 MG TABS (ATORVASTATIN CALCIUM) 1 by mouth at bedtime  #30 x 2   Entered and Authorized by:   Loreen Freud DO   Signed by:   Loreen Freud DO on 06/26/2010   Method used:   Electronically to        [facility] [phone]* (retail)       [address]       [place], [place]  [address]       Ph: [phone] or [phone]       Fax: [phone]   RxID:   [id number]    Orders Added: 1)  Gastroenterology Referral [GI] 2)  Cardiolite [Cardiolite] 3)  Est. Patient Level III [[address]]

## 2010-07-05 NOTE — Letter (Signed)
Summary: Results Follow-up Letter  [place] [facility]  [address] [place], [place] [address]   Phone: [phone]  Fax: [phone]    06/25/2010     Juan Velazquez [address] [place], [place]  [address]     Dear Mr. Raine,   The following are the results of your recent test(s):   Cholesterol LDL(Bad cholesterol):see below  Your goal is less than:         HDL (Good cholesterol):         Your goal is more than: _________________________________________________________ Other Tests:   _________________________________________________________  Please call for an appointment Or  Please call the office to review your results and for an update of your medications. I will be available Monday-Friday from 8am until 5pm.     Sincerely,  Almeta Monas CMA (AAMA) [place] at [facility]

## 2010-07-05 NOTE — Letter (Signed)
Summary: Primary Care Consult Scheduled Letter  [place] [facility] [place]  [address] [place], [place] [address]   Phone: [phone]  Fax: [phone]      06/27/2010 MRN: [id number]  [facility] [address] [place], [place]  [address]  [place]    Dear Mr. Arth,    We have scheduled an appointment for you.  At the recommendation of Dr. Loreen Freud, we have scheduled you a Cardiac Stress Test with Selena Batten on 07-11-2010 at 9:45am.  Their address is [address] [place], [place] [address]. The office phone number is [phone].  If this appointment day and time is not convenient for you, please feel free to call the office of the doctor you are being referred to at the number listed above and reschedule the appointment.    It is important for you to keep your scheduled appointments. We are here to make sure you are given good patient care.   Thank you,    Renee, Patient Care Coordinator [place] at [facility]

## 2010-07-05 NOTE — Progress Notes (Signed)
Summary: correct  phone number, returned call  Phone Note Call from Patient   Caller: Patient Summary of Call: patient will be here this afternoon to see Dr at 3PM----says you can call him on cell if you want   ---wants [place] to eliminate home number [phone]) and only have cell [phone]) as only contact number---I changed EMR and Epic Initial call taken by: Jerolyn Shin,  June 26, 2010 1:06 PM  Follow-up for Phone Call        I will review labs wiht patient when he comes in today for his appt.... Almeta Monas CMA Duncan Dull)  June 26, 2010 1:14 PM

## 2010-07-06 ENCOUNTER — Ambulatory Visit: Payer: Federal, State, Local not specified - PPO | Admitting: Cardiology

## 2010-07-09 ENCOUNTER — Ambulatory Visit (INDEPENDENT_AMBULATORY_CARE_PROVIDER_SITE_OTHER): Payer: Federal, State, Local not specified - PPO | Admitting: Gastroenterology

## 2010-07-09 ENCOUNTER — Encounter: Payer: Self-pay | Admitting: Gastroenterology

## 2010-07-09 DIAGNOSIS — R079 Chest pain, unspecified: Secondary | ICD-10-CM

## 2010-07-10 ENCOUNTER — Telehealth (INDEPENDENT_AMBULATORY_CARE_PROVIDER_SITE_OTHER): Payer: Self-pay | Admitting: *Deleted

## 2010-07-11 ENCOUNTER — Ambulatory Visit (HOSPITAL_COMMUNITY): Payer: Federal, State, Local not specified - PPO | Attending: Cardiology

## 2010-07-11 ENCOUNTER — Encounter: Payer: Self-pay | Admitting: Cardiovascular Disease

## 2010-07-11 DIAGNOSIS — R0609 Other forms of dyspnea: Secondary | ICD-10-CM

## 2010-07-11 DIAGNOSIS — R079 Chest pain, unspecified: Secondary | ICD-10-CM

## 2010-07-11 DIAGNOSIS — R0989 Other specified symptoms and signs involving the circulatory and respiratory systems: Secondary | ICD-10-CM

## 2010-07-11 NOTE — Progress Notes (Signed)
Summary: needs brand name for Lipitor  Phone Note Refill Request Message from:  Patient on July 03, 2010 2:45 PM  Refills Requested: Medication #1:  LIPITOR 10 MG TABS 1 by mouth at bedtime [id number] [address] [place], [place]      phone-[phone],   fax - [phone]    *****NOTE****  patient does NOT want generic--he wants brand name because generic costs $73.00 and brand name with coupon costs $4.00   !!!    please call pharmacy and change prescription from last week  Next Appointment Scheduled: Mon 5/7  LAB Initial call taken by: Jerolyn Shin,  July 03, 2010 2:47 PM    Prescriptions: LIPITOR 10 MG TABS (ATORVASTATIN CALCIUM) 1 by mouth at bedtime Brand medically necessary #30 x 2   Entered by:   Jeremy Johann CMA   Authorized by:   Loreen Freud DO   Signed by:   Jeremy Johann CMA on 07/03/2010   Method used:   Re-Faxed to ...       [facility] [address] (retail)       [address]       [place], [place]  [address]       Ph: [phone] or [phone]       Fax: [phone]   RxID:   [id number]

## 2010-07-11 NOTE — H&P (Signed)
NAMEGABRIELLA, GUILE NO.:  [id number]  MEDICAL RECORD NO.:  [id number]          PATIENT TYPE:  INP  LOCATION:  2507                         FACILITY:  [facility]  PHYSICIAN:  Massie Maroon, MD        DATE OF BIRTH:  [birth year]  DATE OF ADMISSION:  06/16/2010 DATE OF DISCHARGE:                             HISTORY & PHYSICAL   CHIEF COMPLAINT:  Epigastric discomfort.  HISTORY OF PRESENT ILLNESS:  This is a 45 year old male with a complaint of epigastric discomfort 1 hour after supper, which he describes as burning.  He took Rolaids, he took Pepto-Bismol, and it still got worse. It was a waxing and waning type of pain, and he described it as 8.5 to 9 out of 10.  Because it was not improving and it was associated with some slight nausea, the patient presented to the ER for further evaluation. EKG apparently showed normal sinus rhythm at 55, normal axis, normal PR interval, normal QTc, no ST-T segment changes consistent with acute ischemia.  Slight LVH.  Chest x-ray was negative for any acute process.  Cardiac markers were negative.  Urinalysis was negative.  The patient will be admitted for workup of epigastric discomfort.  Most likely related to gastritis or GERD.  PAST MEDICAL HISTORY: 1. Asthma. 2. Allergic rhinitis. 3. Hyperlipidemia.  PAST SURGICAL HISTORY:  None.  SOCIAL HISTORY:  The patient is married.  He does not have any children. He works for the [employer].  He is a Biomedical scientist.  He does not smoke or drink.  He does not do drugs.  FAMILY HISTORY:  His father had defibrillator placed, he is not exactly sure why.  ALLERGIES:  No known drug allergies.  MEDICATIONS: 1. Combivent. 2. Zycel.  REVIEW OF SYSTEMS:  Negative for fever, chills; negative for cough. Negative for diarrhea, bright red blood per rectum or black stool, negative for all 10 organ systems except for pertinent positives stated above.  PHYSICAL  EXAMINATION:  VITAL SIGNS:  Temperature 97.6, pulse 59, blood pressure 124/82, pulse ox is 100% on room air. HEENT:  Anicteric, EOMI.  No nystagmus.  Pupils 1.5 mm, symmetric. Direct, consensual, and near reflexes intact.  Mucous membranes moist. NECK:  No JVD, no bruit, no thyromegaly, no adenopathy. HEART:  Regular rate and rhythm.  S1 and S2.  No murmurs, gallops or rubs. LUNGS:  Clear to auscultation bilaterally. ABDOMEN:  Soft, nontender, nondistended.  Positive bowel sounds. EXTREMITIES:  No cyanosis, clubbing or edema. SKIN:  No rashes. LYMPH NODES:  No adenopathy. NEUROLOGIC:  Nonfocal.  LABORATORY DATA:  Total cholesterol 185, triglycerides 86, HDL 54, and LDL 114.  Sodium 140, potassium 4.1, BUN 14, and creatinine 1.2.  WBC 4.2, hemoglobin 14.4, platelet count 227.  AST 34, ALT 32.  TSH 1.71. CPK 437 (elevated because he is a large guy as well as African American race, probably).  Lipase 176.  Urine drug screen negative.  ASSESSMENT/PLAN: 1. Atypical chest pain, this is not even truly chest pain.  This is     subxiphoid epigastric pain.  It was not nitro responsive.  The pain  actually disappeared about 15 to 20 minutes after sublingual     nitroglycerin was dispensed.  His cardiac markers x2 have been     negative so far.  We will check CK, CK-MB, troponin I q.6 h. x3     sets.  If cardiac markers are negative, patient can be discharged     home with outpatient followup with a stress test.  For now, we will     place the patient on Protonix 40 mg p.o. daily.  He is epigastric     pain free at the present time. 2. Hyperlipidemia:  Diet controlled. 3. Asthma, ProAir HFA p.r.n. 4. Allergic rhinitis.  Claritin p.r.n. 5. Deep vein thrombosis prophylaxis.  SCDs and TED.     Massie Maroon, MD     JYK/MEDQ  D:  06/16/2010  T:  06/16/2010  Job:  [id number]  cc:   Lelon Perla, DO  Electronically Signed by Pearson Grippe MD on 07/11/2010 06:46:15 AM

## 2010-07-16 ENCOUNTER — Other Ambulatory Visit: Payer: Federal, State, Local not specified - PPO | Admitting: Gastroenterology

## 2010-07-17 NOTE — Assessment & Plan Note (Signed)
Summary: stress test; wt: 225. dx: atypical chestpain.dr. Laury Axon. feder...  Nuclear Med Background Indications for Stress Test: Evaluation for Ischemia, Post Hospital  Indications Comments: 06/17/10 CP; (-) enzymes  History: Asthma   Symptoms: Chest Pain  Symptoms Comments: Last CP was 06/17/10.   Nuclear Pre-Procedure Cardiac Risk Factors: Lipids Caffeine/Decaff Intake: none NPO After: 7:30 PM Lungs: Clear IV 0.9% NS with Angio Cath: 18g     IV Site: R Antecubital IV Started by: Stanton Kidney, EMT-P Chest Size (in) 48     Height (in): 69 Weight (lb): 219 BMI: 32.46  Nuclear Med Study 1 or 2 day study:  1 day     Stress Test Type:  Stress Reading MD:  Charlton Haws, MD     Referring MD:  Loann Quill Resting Radionuclide:  Technetium 39m Tetrofosmin     Resting Radionuclide Dose:  10.7 mCi  Stress Radionuclide:  Technetium 20m Tetrofosmin     Stress Radionuclide Dose:  33 mCi   Stress Protocol Exercise Time (min):  14:01 min     Max HR:  179 bpm     Predicted Max HR:  176 bpm  Max Systolic BP: 215 mm Hg     Percent Max HR:  101.70 %     METS: 17.2 Rate Pressure Product:  [address]    Stress Test Technologist:  Irean Hong,  RN     Nuclear Technologist:  Doyne Keel, CNMT  Rest Procedure  Myocardial perfusion imaging was performed at rest 45 minutes following the intravenous administration of Technetium 67m Tetrofosmin.  Stress Procedure  The patient exercised for 14 minutes and 01 second, RPE=15. The patient stopped due to DOE and denied any chest pain.  There were nonspecific ST-T wave changes. The patient had a hypertensive response to exercise.  Technetium 66m Tetrofosmin was injected at peak exercise and myocardial perfusion imaging was performed after a brief delay.  QPS Raw Data Images:  motion artrifact Stress Images:  Normal homogeneous uptake in all areas of the myocardium. Rest Images:  Normal homogeneous uptake in all areas of the myocardium. Subtraction (SDS):   Normal Transient Ischemic Dilatation:  0.94  (Normal <1.22)  Lung/Heart Ratio:  0.36  (Normal <0.45)  Quantitative Gated Spect Images QGS EDV:  122 ml QGS ESV:  49 ml QGS EF:  60 % QGS cine images:  normal  Findings Normal nuclear study      Overall Impression  Exercise Capacity: Good exercise capacity. BP Response: Normal blood pressure response. Clinical Symptoms: Dyspnea ECG Impression: No significant ST segment change suggestive of ischemia. Overall Impression: Normal stress nuclear study. Overall Impression Comments: Resting images show decreased counts in inferior apex likely from motion and bowel artifact

## 2010-07-17 NOTE — Letter (Signed)
Summary: EGD Instructions  [facility]  [address] [place], [place] [address]   Phone: [phone]  Fax: [phone]       Juan Velazquez    November 20, 1965    MRN: [id number]       Procedure Day /Date: Monday February 27th, 2012     Arrival Time:  12:30pm     Procedure Time: 1:30pm     Location of Procedure:                    _ x _ [facility] (4th Floor)    PREPARATION FOR ENDOSCOPY   On 07/16/10 THE DAY OF THE PROCEDURE:  1.   No solid foods, milk or milk products are allowed after midnight the night before your procedure.  2.   Do not drink anything colored red or purple.  Avoid juices with pulp.  No orange juice.  3.  You may drink clear liquids until11:30am, which is 2 hours before your procedure.                                                                                                CLEAR LIQUIDS INCLUDE: Water Jello Ice Popsicles Tea (sugar ok, no milk/cream) Powdered fruit flavored drinks Coffee (sugar ok, no milk/cream) Gatorade Juice: apple, white grape, white cranberry  Lemonade Clear bullion, consomm, broth Carbonated beverages (any kind) Strained chicken noodle soup Hard Candy   MEDICATION INSTRUCTIONS  Unless otherwise instructed, you should take regular prescription medications with a small sip of water as early as possible the morning of your procedure.        OTHER INSTRUCTIONS  You will need a responsible adult at least 44 years of age to accompany you and drive you home.   This person must remain in the waiting room during your procedure.  Wear loose fitting clothing that is easily removed.  Leave jewelry and other valuables at home.  However, you may wish to bring a book to read or an iPod/MP3 player to listen to music as you wait for your procedure to start.  Remove all body piercing jewelry and leave at home.  Total time from sign-in until discharge is approximately 2-3 hours.  You should go  home directly after your procedure and rest.  You can resume normal activities the day after your procedure.  The day of your procedure you should not:   Drive   Make legal decisions   Operate machinery   Drink alcohol   Return to work  You will receive specific instructions about eating, activities and medications before you leave.    The above instructions have been reviewed and explained to me by   _______________________    I fully understand and can verbalize these instructions _____________________________ Date _________

## 2010-07-17 NOTE — Assessment & Plan Note (Addendum)
Summary: GERD     History of Present Illness Visit Type: Follow-up Visit Primary GI MD: Melvia Heaps MD [facility] Primary Provider: Loreen Freud, DO Requesting Provider: na Chief Complaint: GERD  History of Present Illness:    [name] es a 45 year old African American male referred at the request of Dr. Laury Axon  for evaluation of chest pain. Approximately 3 weeks ago he was seen at the ER for postprandial moderately severe lower chest pain. Pain was described as pressure.  He was given a GI cocktail without much relief. He was admitted for observation where cardiac ischemia was ruled out by enzymes and EKG. He occasionally has pyrosis. He has not had pain since this one event. He denies dysphagia. He's on no gastric irritants including nonsteroidals.   GI Review of Systems    Reports acid reflux and  heartburn.      Denies abdominal pain, belching, bloating, chest pain, dysphagia with liquids, dysphagia with solids, loss of appetite, nausea, vomiting, vomiting blood, weight loss, and  weight gain.        Denies anal fissure, black tarry stools, change in bowel habit, constipation, diarrhea, diverticulosis, fecal incontinence, heme positive stool, hemorrhoids, irritable bowel syndrome, jaundice, light color stool, liver problems, rectal bleeding, and  rectal pain.    Current Medications (verified): 1)  Xyzal 5 Mg Tabs (Levocetirizine Dihydrochloride) .... Take 1 Tab Once Daily 2)  Lipitor 10 Mg Tabs (Atorvastatin Calcium) .Marland Kitchen.. 1 By Mouth At Bedtime 3)  Proventil Hfa 108 (90 Base) Mcg/act Aers (Albuterol Sulfate) .... Use As Needed 4)  Qvar 40 Mcg/act Aers (Beclomethasone Dipropionate) .Marland Kitchen.. 1 Puff Two Times A Day 5)  Pepcid Ac 10 Mg Tabs (Famotidine) .Marland Kitchen.. 1 By Mouth Once Daily  Allergies (verified): No Known Drug Allergies  Past History:  Past Medical History: Hyperlipidemia Allergic rhinitis Asthma GERD  Past Surgical History: Reviewed history from 05/23/2009 and no changes  required. Denies surgical history  Family History: Reviewed history from 06/06/2008 and no changes required. Family History Diabetes 1st degree relative--M brother ---DMII sister --DM MAunt-- Colon Cancer Family History Hypertension  Social History: Reviewed history from 06/06/2008 and no changes required. 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 Smoked Alcohol use-no Drug use-no Regular exercise-yes  Review of Systems  The patient denies allergy/sinus, anemia, anxiety-new, arthritis/joint pain, back pain, blood in urine, breast changes/lumps, change in vision, confusion, cough, coughing up blood, depression-new, fainting, fatigue, fever, headaches-new, hearing problems, heart murmur, heart rhythm changes, itching, menstrual pain, muscle pains/cramps, night sweats, nosebleeds, pregnancy symptoms, shortness of breath, skin rash, sleeping problems, sore throat, swelling of feet/legs, swollen lymph glands, thirst - excessive , urination - excessive , urination changes/pain, urine leakage, vision changes, and voice change.    Vital Signs:  Patient profile:   45 year old male Height:      70.5 inches Weight:      226 pounds BMI:     32.08 BSA:     2.21 Pulse rate:   64 / minute Pulse rhythm:   regular BP sitting:   120 / 74  (left arm) Cuff size:   large  Vitals Entered By: Ok Anis CMA (July 09, 2010 8:58 AM)  Physical Exam  Additional Exam:   On physical exam he is a well-developed well-nourished ma Physical Exam: General:   WDWN HEENT:   anicteric.  No pharyngeal abnormalities Neck:   No masses, thyroidmegaly Nodes:   No cervical, axillary, inguinal adenopathy Chest:    Clear to auscultation Cardiac:   No  murmurs, gallops, rubs Abdomen:   BS active.  No abd masses, tenderness, organomegaly Rectal:   Deferred Extremities:   No cyanosis, clubbing, edema Skeletal:   No deformities Neuro:   Alert, oriented x3.  No focal  abnormalities     Impression & Recommendations:  Problem # 1:  CHEST PAIN, ATYPICAL (ICD-786.59)   Pain may be of GI origin. Possibilities include GERD, acid reflux with esophageal spasm, or less likely gallbladder pain.   Recommendations #1 begin AcipHex 20 mg daily #2 upper endoscopy #3 abdominal ultrasound if endoscopy is negative  Risks, alternatives, and complications of the procedure, including bleeding, perforation, and possible need for surgery, were explained to the patient.  Patient's questions were answered.  Orders: EGD (EGD)  Patient Instructions: 1)  Upper Endoscopy brochure given.  2)  Start Aciphex samples one tablet by mouth once daily. 3)  Copy sent to : Loreen Freud, DO 4)  The medication list was reviewed and reconciled.  All changed / newly prescribed medications were explained.  A complete medication list was provided to the patient / caregiver.

## 2010-07-17 NOTE — Progress Notes (Signed)
Summary: Nuclear Pre-Procedure  Phone Note Outgoing Call Call back at [facility] Phone [phone]   Call placed by: Stanton Kidney, EMT-P,  July 10, 2010 2:05 PM Action Taken: Phone Call Completed Summary of Call: Left message with information on [employer] Information Sheet (see scanned document for details). Stanton Kidney, EMT-P  July 10, 2010 2:05 PM      Nuclear Med Background Indications for Stress Test: Evaluation for Ischemia, Post Hospital  Indications Comments: 06/17/10 CP; (-) enzymes  History: Asthma   Symptoms: Chest Pain    Nuclear Pre-Procedure Cardiac Risk Factors: Lipids Height (in): 70.5

## 2010-07-18 ENCOUNTER — Encounter: Payer: Self-pay | Admitting: Cardiology

## 2010-07-18 ENCOUNTER — Ambulatory Visit (INDEPENDENT_AMBULATORY_CARE_PROVIDER_SITE_OTHER): Payer: Federal, State, Local not specified - PPO | Admitting: Cardiology

## 2010-07-18 DIAGNOSIS — R079 Chest pain, unspecified: Secondary | ICD-10-CM

## 2010-07-26 NOTE — Assessment & Plan Note (Signed)
Summary: np6 per night message and Dr. Laury Axon pt was seen in ED on Jan....      Allergies Added: NKDA  Visit Type:  new patient - fu of stress test. Referring Provider:  na Primary Provider:  Loreen Freud, DO  CC:  pt has no complaints today.Marland Kitchen  History of Present Illness: Juan Velazquez comes in for CP and evaluation of a stess myoview on 07/11/10. This was prompted a visit to the ER on 06/15/10 for severe CP. I have reviewed these records which demonstrated normal CE's. CXR and EKG. It was felt to be severe reflux. Stress Myoview showed excellent exercise tolerance, no ischemia, and an EF of 60% I have reviewed all of this with him today.  CRF's only hyperlipidemia.   Clinical Reports Reviewed:  Nuclear Study:  07/11/2010:  Overall Impression   Exercise Capacity: Good exercise capacity. BP Response: Normal blood pressure response. Clinical Symptoms: Dyspnea ECG Impression: No significant ST segment change suggestive of ischemia. Overall Impression: Normal stress nuclear study. Overall Impression Comments: Resting images show decreased counts in inferior apex likely from motion and bowel artifact   Current Medications (verified): 1)  Xyzal 5 Mg Tabs (Levocetirizine Dihydrochloride) .... Take 1 Tab Once Daily 2)  Lipitor 10 Mg Tabs (Atorvastatin Calcium) .Marland Kitchen.. 1 By Mouth At Bedtime 3)  Proventil Hfa 108 (90 Base) Mcg/act Aers (Albuterol Sulfate) .... Use As Needed 4)  Qvar 40 Mcg/act Aers (Beclomethasone Dipropionate) .Marland Kitchen.. 1 Puff Two Times A Day 5)  Pepcid Ac 10 Mg Tabs (Famotidine) .Marland Kitchen.. 1 By Mouth Once Daily  Allergies (verified): No Known Drug Allergies  Past History:  Past Medical History: Last updated: 07/09/2010 Hyperlipidemia Allergic rhinitis Asthma GERD  Past Surgical History: Last updated: 05/23/2009 Denies surgical history  Family History: Last updated: 06/06/2008 Family History Diabetes 1st degree relative--M brother ---DMII sister --DM MAunt-- Colon  Cancer Family History Hypertension  Social History: Last updated: 06/06/2008 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 Smoked Alcohol use-no Drug use-no Regular exercise-yes  Risk Factors: Alcohol Use: 0 (06/15/2010) Caffeine Use: 0 (06/15/2010) Exercise: yes (06/15/2010)  Risk Factors: Smoking Status: never (06/15/2010) Passive Smoke Exposure: no (06/15/2010)  Review of Systems       NEGATIVE OTHER THAN HPI  Vital Signs:  Patient profile:   45 year old male Height:      69 inches Weight:      223.50 pounds BMI:     33.12 Pulse rate:   64 / minute Resp:     16 per minute BP sitting:   118 / 78  (left arm) Cuff size:   large  Vitals Entered By: Celestia Khat, CMA (July 18, 2010 4:30 PM)  Physical Exam  General:  muscular, NAD   Impression & Recommendations:  Problem # 1:  CHEST PAIN, ATYPICAL (ICD-786.59) Noncardiac. Ihave reviewed all test results, symptoms of GERD, sxs of an ACS, and how to respond. All questions answered. Advised to continue statin, ASA not felt necessary. Followup with Dr Hulan Saas.  Patient Instructions: 1)  Your physician recommends that you schedule a follow-up appointment in: as needed with Dr. Daleen Squibb 2)  Your physician recommends that you continue on your current medications as directed. Please refer to the Current Medication list given to you today.

## 2010-08-06 ENCOUNTER — Other Ambulatory Visit (AMBULATORY_SURGERY_CENTER): Payer: Federal, State, Local not specified - PPO | Admitting: Gastroenterology

## 2010-08-06 ENCOUNTER — Other Ambulatory Visit: Payer: Self-pay | Admitting: Gastroenterology

## 2010-08-06 DIAGNOSIS — D131 Benign neoplasm of stomach: Secondary | ICD-10-CM

## 2010-08-06 DIAGNOSIS — K317 Polyp of stomach and duodenum: Secondary | ICD-10-CM

## 2010-08-06 DIAGNOSIS — K2940 Chronic atrophic gastritis without bleeding: Secondary | ICD-10-CM

## 2010-08-06 DIAGNOSIS — R079 Chest pain, unspecified: Secondary | ICD-10-CM

## 2010-08-06 HISTORY — DX: Polyp of stomach and duodenum: K31.7

## 2010-08-09 ENCOUNTER — Encounter: Payer: Self-pay | Admitting: Family Medicine

## 2010-08-09 ENCOUNTER — Ambulatory Visit (INDEPENDENT_AMBULATORY_CARE_PROVIDER_SITE_OTHER): Payer: Federal, State, Local not specified - PPO | Admitting: Family Medicine

## 2010-08-09 VITALS — BP 120/70 | HR 64 | Temp 98.9°F | Wt 224.4 lb

## 2010-08-09 DIAGNOSIS — J40 Bronchitis, not specified as acute or chronic: Secondary | ICD-10-CM

## 2010-08-09 MED ORDER — GUAIFENESIN-CODEINE 100-10 MG/5ML PO SYRP: 5.0000 mL | ORAL_SOLUTION | Freq: Two times a day (BID) | ORAL | Status: AC | PRN

## 2010-08-09 MED ORDER — AMOXICILLIN-POT CLAVULANATE 875-125 MG PO TABS: 1.0000 | ORAL_TABLET | Freq: Two times a day (BID) | ORAL | Status: AC

## 2010-08-09 NOTE — Assessment & Plan Note (Signed)
augmentin cheratussin in pm mucinex during day rto prn

## 2010-08-09 NOTE — Progress Notes (Signed)
  Subjective:    Patient ID: Juan Velazquez, male    DOB: 12/05/1965, 45 y.o.   MRN: [id number]  Cough This is a new problem. The current episode started in the past 7 days. The problem has been gradually worsening. The problem occurs constantly. The cough is productive of sputum. Associated symptoms include chills, myalgias and wheezing. Pertinent negatives include no chest pain, ear congestion, ear pain, fever, headaches, heartburn, hemoptysis, nasal congestion, postnasal drip, rash, rhinorrhea, sore throat, shortness of breath, sweats or weight loss. The symptoms are aggravated by nothing. He has tried OTC cough suppressant for the symptoms. The treatment provided no relief. His past medical history is significant for environmental allergies. There is no history of asthma, bronchiectasis, bronchitis, COPD, emphysema or pneumonia.      Review of Systems  Constitutional: Positive for chills. Negative for fever and weight loss.  HENT: Negative for ear pain, sore throat, rhinorrhea and postnasal drip.   Respiratory: Positive for cough and wheezing. Negative for hemoptysis and shortness of breath.   Cardiovascular: Negative for chest pain.  Gastrointestinal: Negative for heartburn.  Musculoskeletal: Positive for myalgias.  Skin: Negative for rash.  Neurological: Negative for headaches.  Hematological: Positive for environmental allergies.  All other systems reviewed and are negative.       Objective:   Physical Exam  Constitutional: He is oriented to person, place, and time. He appears well-developed and well-nourished. No distress.  HENT:  Right Ear: External ear normal.  Left Ear: External ear normal.  Nose: Nose normal.  Mouth/Throat: Oropharynx is clear and moist. No oropharyngeal exudate.  Neck: Neck supple.  Cardiovascular: Normal rate, regular rhythm and normal heart sounds.   No murmur heard. Pulmonary/Chest: Effort normal. No stridor. No respiratory distress. He has  wheezes. He has rales. He exhibits no tenderness.  Musculoskeletal: He exhibits no edema.  Lymphadenopathy:    He has no cervical adenopathy.  Neurological: He is alert and oriented to person, place, and time.  Skin: Skin is warm and dry. No rash noted. He is not diaphoretic.  Psychiatric: He has a normal mood and affect.          Assessment & Plan:

## 2010-08-11 ENCOUNTER — Encounter: Payer: Self-pay | Admitting: Gastroenterology

## 2010-08-13 ENCOUNTER — Ambulatory Visit (HOSPITAL_BASED_OUTPATIENT_CLINIC_OR_DEPARTMENT_OTHER)
Admission: RE | Admit: 2010-08-13 | Discharge: 2010-08-13 | Disposition: A | Discharge status: Home / Self Care | Payer: Federal, State, Local not specified - PPO | Source: Ambulatory Visit | Attending: Family Medicine | Admitting: Family Medicine

## 2010-08-13 ENCOUNTER — Telehealth: Payer: Self-pay | Admitting: *Deleted

## 2010-08-13 DIAGNOSIS — R059 Cough, unspecified: Secondary | ICD-10-CM | POA: Insufficient documentation

## 2010-08-13 DIAGNOSIS — J40 Bronchitis, not specified as acute or chronic: Secondary | ICD-10-CM

## 2010-08-13 DIAGNOSIS — R05 Cough: Secondary | ICD-10-CM | POA: Insufficient documentation

## 2010-08-13 DIAGNOSIS — J3489 Other specified disorders of nose and nasal sinuses: Secondary | ICD-10-CM | POA: Insufficient documentation

## 2010-08-13 NOTE — Telephone Encounter (Signed)
Pt c/o dry persistant cough, some drainage and congestion and rattling in chest. Pt has been using xyzal, robitussin DM and is using inhaler daily. Pls advise.Felecia Daleysa Kristiansen CMA

## 2010-08-13 NOTE — Telephone Encounter (Signed)
Pt aware, x-ray orders put in and Pt is still taking antibiotic.Marland KitchenFelecia Lanie Velazquez CMA

## 2010-08-13 NOTE — Telephone Encounter (Signed)
Is he taking abx?    Check chest xray today.

## 2010-08-14 ENCOUNTER — Encounter: Payer: Self-pay | Admitting: *Deleted

## 2010-08-16 NOTE — Procedures (Addendum)
Summary: Upper Endoscopy  Patient: Juan Velazquez Note: All result statuses are Final unless otherwise noted.  Tests: (1) Upper Endoscopy (EGD)   EGD Upper Endoscopy       DONE     [facility]     [address]     [place], [place]  [address]          ENDOSCOPY PROCEDURE REPORT          PATIENT:  Juan Velazquez, Juan Velazquez  MR#:  [id number]     BIRTHDATE:  1966/02/12, 45 yrs. old  GENDER:  male          ENDOSCOPIST:  Barbette Hair. Arlyce Dice, MD     Referred by:  Loreen Freud, DO          PROCEDURE DATE:  08/06/2010     PROCEDURE:  EGD with biopsy, 19147     ASA CLASS:  Class I     INDICATIONS:  chest pain          MEDICATIONS:   Fentanyl 50 mcg IV, Versed 7 mg IV, glycopyrrolate     (Robinal) 0.2 mg IV, 0.6cc simethancone 0.6 cc PO     TOPICAL ANESTHETIC:  Exactacain Spray          DESCRIPTION OF PROCEDURE:   After the risks benefits and     alternatives of the procedure were thoroughly explained, informed     consent was obtained.  The LB GIF-H180 [id number] endoscope was     introduced through the mouth and advanced to the third portion of     the duodenum, without limitations.  The instrument was slowly     withdrawn as the mucosa was fully examined.     <<PROCEDUREIMAGES>>          A sessile polyp was found in the body of the stomach. 2.5-3cm     sessile polyp with normal overlying mucosa. Bxs taken (see image7     and image8).  Otherwise the examination was normal (see image1,     image2, image3, image4, image5, image9, image10, and image11).     Retroflexed views revealed no abnormalities.    The scope was then     withdrawn from the patient and the procedure completed.          COMPLICATIONS:  None          ENDOSCOPIC IMPRESSION:     1) Sessile polyp in the body of the stomach     2) Otherwise normal examination          Chest pain probably related to GERD.  Incidental gastric polyp          RECOMMENDATIONS:     1) continue PPI     2) Await biopsy  results          REPEAT EXAM:   You will receive a letter from Dr. Arlyce Dice in 1-2     weeks, after reviewing the final pathology, with followup     recommendations..          ______________________________     Barbette Hair Arlyce Dice, MD          CC:          n.     eSIGNED:   Barbette Hair. Kaplan at 08/06/2010 02:42 PM          Adela Ports, [phone]  Note: An exclamation mark (!) indicates a result that was not dispersed into the flowsheet. Document Creation Date: 08/06/2010 2:43  PM _______________________________________________________________________  (1) Order result status: Final Collection or observation date-time: 08/06/2010 14:31 Requested date-time:  Receipt date-time:  Reported date-time:  Referring Physician:   Ordering Physician: Melvia Heaps [phone]) Specimen Source:  Source: [name] Order Number: [id number] Lab site:

## 2010-08-28 ENCOUNTER — Telehealth: Payer: Self-pay | Admitting: Family Medicine

## 2010-08-29 ENCOUNTER — Telehealth: Payer: Self-pay | Admitting: Family Medicine

## 2010-08-29 MED ORDER — BECLOMETHASONE DIPROPIONATE 40 MCG/ACT IN AERS: 2.0000 | INHALATION_SPRAY | Freq: Two times a day (BID) | RESPIRATORY_TRACT | Status: DC

## 2010-08-29 NOTE — Telephone Encounter (Signed)
Ok to send rx----#1  5 refills

## 2010-08-29 NOTE — Telephone Encounter (Signed)
Qvar 40 sample was given to the patient is it ok to send Rx to the pharmacy.  Please advise    KP

## 2010-08-29 NOTE — Telephone Encounter (Signed)
Addended by: Almeta Monas on: 08/29/2010 03:49 PM   Modules accepted: Orders

## 2010-08-29 NOTE — Telephone Encounter (Signed)
Patient says Dr Beverely Low gave him a sample of Q Var inhaler at last office visit--patient needs prescription called into [facility], [address], [place]

## 2010-08-29 NOTE — Telephone Encounter (Signed)
Pt aware Rx sent to pharmacy      KP

## 2010-09-06 NOTE — Telephone Encounter (Signed)
error 

## 2010-09-21 ENCOUNTER — Ambulatory Visit (INDEPENDENT_AMBULATORY_CARE_PROVIDER_SITE_OTHER): Payer: Federal, State, Local not specified - PPO | Admitting: Family Medicine

## 2010-09-21 ENCOUNTER — Encounter: Payer: Self-pay | Admitting: Family Medicine

## 2010-09-21 VITALS — BP 100/66 | Temp 98.9°F | Wt 222.5 lb

## 2010-09-21 DIAGNOSIS — J309 Allergic rhinitis, unspecified: Secondary | ICD-10-CM

## 2010-09-21 MED ORDER — FLUTICASONE PROPIONATE 50 MCG/ACT NA SUSP: 2.0000 | Freq: Every day | NASAL | Status: DC

## 2010-09-21 NOTE — Progress Notes (Signed)
  Subjective:    Patient ID: Juan Velazquez, male    DOB: 1965-08-19, 45 y.o.   MRN: [id number]  HPI URI- sxs started Tuesday w/ sore throat and nasal congestion.  Yesterday sxs worsened w/ 'one side of my face is completely blocked'.  Denies facial pain but definite congestion.  No ear pain.  Slight cough.  Discomfort w/ leaning forward due to sinus pressure.  No fevers.  + sick contacts.   Review of Systems For ROS see HPI     Objective:   Physical Exam  Constitutional: He appears well-developed and well-nourished. No distress.  HENT:  Head: Normocephalic and atraumatic.  Mouth/Throat: Oropharynx is clear and moist. No oropharyngeal exudate.       Marked turbinate edema No TTP over sinuses  Eyes: Conjunctivae and EOM are normal. Pupils are equal, round, and reactive to light.  Neck: Normal range of motion. Neck supple.  Cardiovascular: Normal rate, regular rhythm and normal heart sounds.   Pulmonary/Chest: Effort normal and breath sounds normal. No respiratory distress. He has no wheezes.  Lymphadenopathy:    He has no cervical adenopathy.          Assessment & Plan:

## 2010-09-21 NOTE — Patient Instructions (Signed)
You have severe seasonal allergies coupled w/ a viral infection Start the nasal spray daily- 2 squirts on each side- to decrease your nasal congestion Continue the Xyzal daily Add Sudafed for the next 3-5 days as directed on the box Drink plenty of fluids Tylenol/ibuprofen for pain or fever If you develop facial pain- call me Sherri Rad in there!!!

## 2010-09-21 NOTE — Assessment & Plan Note (Signed)
Pt w/out sxs of infection currently but does have extensive nasal congestion.  Encouraged nasal spray, antihistamines.  Reviewed supportive care and red flags that should prompt return.  Pt expressed understanding and is in agreement w/ plan.

## 2010-09-24 ENCOUNTER — Other Ambulatory Visit: Payer: Federal, State, Local not specified - PPO

## 2010-09-27 ENCOUNTER — Telehealth: Payer: Self-pay | Admitting: Family Medicine

## 2010-09-27 MED ORDER — ALBUTEROL SULFATE HFA 108 (90 BASE) MCG/ACT IN AERS: 2.0000 | INHALATION_SPRAY | Freq: Four times a day (QID) | RESPIRATORY_TRACT | Status: DC | PRN

## 2010-09-27 NOTE — Telephone Encounter (Signed)
Faxed.   KP 

## 2010-09-27 NOTE — Telephone Encounter (Signed)
Patient is calling to get a new prescription for Provental called [facility] to [facility] [address], [place]

## 2010-10-01 ENCOUNTER — Other Ambulatory Visit: Payer: Self-pay

## 2010-10-01 MED ORDER — ATORVASTATIN CALCIUM 10 MG PO TABS: 10.0000 mg | ORAL_TABLET | Freq: Every day | ORAL | Status: DC

## 2010-10-30 ENCOUNTER — Other Ambulatory Visit: Payer: Self-pay | Admitting: Family Medicine

## 2010-12-02 ENCOUNTER — Other Ambulatory Visit: Payer: Self-pay | Admitting: Family Medicine

## 2010-12-03 ENCOUNTER — Other Ambulatory Visit: Payer: Self-pay | Admitting: Family Medicine

## 2011-01-03 ENCOUNTER — Other Ambulatory Visit: Payer: Self-pay | Admitting: Family Medicine

## 2011-02-23 ENCOUNTER — Other Ambulatory Visit: Payer: Self-pay | Admitting: Family Medicine

## 2011-05-01 ENCOUNTER — Other Ambulatory Visit: Payer: Self-pay | Admitting: Family Medicine

## 2011-06-07 ENCOUNTER — Other Ambulatory Visit: Payer: Self-pay | Admitting: Family Medicine

## 2011-06-07 MED ORDER — ATORVASTATIN CALCIUM 10 MG PO TABS: ORAL_TABLET | ORAL | Status: DC

## 2011-06-07 NOTE — Telephone Encounter (Signed)
Rx sent 

## 2011-06-12 ENCOUNTER — Telehealth: Payer: Self-pay

## 2011-06-12 ENCOUNTER — Other Ambulatory Visit: Payer: Self-pay

## 2011-06-12 MED ORDER — LEVOCETIRIZINE DIHYDROCHLORIDE 5 MG PO TABS: 5.0000 mg | ORAL_TABLET | Freq: Every evening | ORAL | Status: DC

## 2011-06-12 NOTE — Telephone Encounter (Signed)
Discussed with patient and he stated he tried to get a refill and he was told by Eber Jones a few days that we did not get the request--- Refill request was not put in. Discussed with patient apologized for the inconvenience and Rx has been faxed    KP

## 2011-06-12 NOTE — Telephone Encounter (Signed)
Rx faxed and patient has been made aware      KP 

## 2011-06-17 ENCOUNTER — Encounter: Payer: Self-pay | Admitting: Family Medicine

## 2011-06-17 ENCOUNTER — Encounter: Payer: Federal, State, Local not specified - PPO | Admitting: Family Medicine

## 2011-06-17 ENCOUNTER — Ambulatory Visit (INDEPENDENT_AMBULATORY_CARE_PROVIDER_SITE_OTHER): Payer: Federal, State, Local not specified - PPO | Admitting: Family Medicine

## 2011-06-17 VITALS — BP 106/68 | HR 70 | Temp 98.7°F | Ht 70.0 in | Wt 226.0 lb

## 2011-06-17 DIAGNOSIS — Z23 Encounter for immunization: Secondary | ICD-10-CM

## 2011-06-17 DIAGNOSIS — N469 Male infertility, unspecified: Secondary | ICD-10-CM

## 2011-06-17 DIAGNOSIS — J45909 Unspecified asthma, uncomplicated: Secondary | ICD-10-CM

## 2011-06-17 DIAGNOSIS — E785 Hyperlipidemia, unspecified: Secondary | ICD-10-CM

## 2011-06-17 DIAGNOSIS — Z0000 Encounter for general adult medical examination without abnormal findings: Secondary | ICD-10-CM

## 2011-06-17 LAB — BASIC METABOLIC PANEL
Calcium: 9.4 mg/dL (ref 8.4–10.5)
Chloride: 106 mEq/L (ref 96–112)
Creatinine, Ser: 1.4 mg/dL (ref 0.4–1.5)
Sodium: 141 mEq/L (ref 135–145)

## 2011-06-17 LAB — LIPID PANEL
LDL Cholesterol: 101 mg/dL — ABNORMAL HIGH (ref 0–99)
Total CHOL/HDL Ratio: 3
VLDL: 16.6 mg/dL (ref 0.0–40.0)

## 2011-06-17 LAB — CBC WITH DIFFERENTIAL/PLATELET
Basophils Relative: 0.7 % (ref 0.0–3.0)
Eosinophils Relative: 2.6 % (ref 0.0–5.0)
Hemoglobin: 14.1 g/dL (ref 13.0–17.0)
Lymphocytes Relative: 26.9 % (ref 12.0–46.0)
Neutro Abs: 3.1 10*3/uL (ref 1.4–7.7)
Neutrophils Relative %: 60.9 % (ref 43.0–77.0)
RBC: 4.91 Mil/uL (ref 4.22–5.81)
WBC: 5.1 10*3/uL (ref 4.5–10.5)

## 2011-06-17 LAB — HEPATIC FUNCTION PANEL
AST: 35 U/L (ref 0–37)
Alkaline Phosphatase: 78 U/L (ref 39–117)
Total Bilirubin: 0.9 mg/dL (ref 0.3–1.2)

## 2011-06-17 LAB — POCT URINALYSIS DIPSTICK
Leukocytes, UA: NEGATIVE
Protein, UA: NEGATIVE
Urobilinogen, UA: 0.2

## 2011-06-17 LAB — PSA: PSA: 1.52 ng/mL (ref 0.10–4.00)

## 2011-06-17 NOTE — Patient Instructions (Addendum)
Preventative Care for Adults, Male A healthy lifestyle and preventative care can promote health and wellness. Preventative health guidelines for men include the following key practices:  A routine yearly physical is a good way to check with your caregiver about your health and preventative screening. It is a chance to share any concerns and updates on your health, and to receive a thorough exam.   Visit your dentist for a routine exam and preventative care every 6 months. Brush your teeth twice a day and floss once a day. Good oral hygiene prevents tooth decay and gum disease.   The frequency of eye exams is based on your age, health, family medical history, use of contact lenses, and other factors. Follow your caregiver's recommendations for frequency of eye exams.   Eat a healthy diet. Foods like vegetables, fruits, whole grains, low-fat dairy products, and lean protein foods contain the nutrients you need without too many calories. Decrease your intake of foods high in solid fats, added sugars, and salt. Eat the right amount of calories for you.Get information about a proper diet from your caregiver, if necessary.   Regular physical exercise is one of the most important things you can do for your health. Most adults should get at least 150 minutes of moderate-intensity exercise (any activity that increases your heart rate and causes you to sweat) each week. In addition, most adults need muscle-strengthening exercises on 2 or more days a week.   Maintain a healthy weight. The body mass index (BMI) is a screening tool to identify possible weight problems. It provides an estimate of body fat based on height and weight. Your caregiver can help determine your BMI, and can help you achieve or maintain a healthy weight.For adults 20 years and older:   A BMI below 18.5 is considered underweight.   A BMI of 18.5 to 24.9 is normal.   A BMI of 25 to 29.9 is considered overweight.   A BMI of 30 and  above is considered obese.   Maintain normal blood lipids and cholesterol levels by exercising and minimizing your intake of saturated fat. Eat a balanced diet with plenty of fruit and vegetables. Blood tests for lipids and cholesterol should begin at age 20 and be repeated every 5 years. If your lipid or cholesterol levels are high, you are over 50, or you are a high risk for heart disease, you may need your cholesterol levels checked more frequently.Ongoing high lipid and cholesterol levels should be treated with medicines if diet and exercise are not effective.   If you smoke, find out from your caregiver how to quit. If you do not use tobacco, do not start.   If you choose to drink alcohol, do not exceed 2 drinks per day. One drink is considered to be 12 ounces (355 mL) of beer, 5 ounces (148 mL) of wine, or 1.5 ounces (44 mL) of liquor.   Avoid use of street drugs. Do not share needles with anyone. Ask for help if you need support or instructions about stopping the use of drugs.   High blood pressure causes heart disease and increases the risk of stroke. Your blood pressure should be checked at least every 1 to 2 years. Ongoing high blood pressure should be treated with medicines, if weight loss and exercise are not effective.   If you are 45 to 45 years old, ask your caregiver if you should take aspirin to prevent heart disease.   Diabetes screening involves taking a blood   sample to check your fasting blood sugar level. This should be done once every 3 years, after age 45, if you are within normal weight and without risk factors for diabetes. Testing should be considered at a younger age or be carried out more frequently if you are overweight and have at least 1 risk factor for diabetes.   Colorectal cancer can be detected and often prevented. Most routine colorectal cancer screening begins at the age of 50 and continues through age 75. However, your caregiver may recommend screening at an  earlier age if you have risk factors for colon cancer. On a yearly basis, your caregiver may provide home test kits to check for hidden blood in the stool. Use of a small camera at the end of a tube, to directly examine the colon (sigmoidoscopy or colonoscopy), can detect the earliest forms of colorectal cancer. Talk to your caregiver about this at age 50, when routine screening begins. Direct examination of the colon should be repeated every 5 to 10 years through age 75, unless early forms of pre-cancerous polyps or small growths are found.   Practice safe sex. Use condoms and avoid high-risk sexual practices to reduce the spread of sexually transmitted infections (STIs). STIs include gonorrhea, chlamydia, syphilis, trichomonas, herpes, HPV, and human immunodeficiency virus (HIV). Herpes, HIV, and HPV are viral illnesses that have no cure. They can result in disability, cancer, and death.   A one-time screening for abdominal aortic aneurysm (AAA) and surgical repair of large AAAs by sound wave imaging (ultrasonography) is recommended for ages 65 to 75 years who are current or former smokers.   Healthy men should no longer receive prostate-specific antigen (PSA) blood tests as part of routine cancer screening. Consult with your caregiver about prostate cancer screening.   Use sunscreen with skin protection factor (SPF) of 30 or more. Apply sunscreen liberally and repeatedly throughout the day. You should seek shade when your shadow is shorter than you. Protect yourself by wearing long sleeves, pants, a wide-brimmed hat, and sunglasses year round, whenever you are outdoors.   Once a month, do a whole body skin exam, using a mirror to look at the skin on your back. Notify your caregiver of new moles, moles that have irregular borders, moles that are larger than a pencil eraser, or moles that have changed in shape or color.   Stay current with required immunizations.   Influenza. You need a dose every  fall (or winter). The composition of the flu vaccine changes each year, so being vaccinated once is not enough.   Pneumococcal polysaccharide. You need 1 to 2 doses if you smoke cigarettes or if you have certain chronic medical conditions. You need 1 dose at age 65 (or older) if you have never been vaccinated.   Tetanus, diphtheria, pertussis (Tdap, Td). Get 1 dose of Tdap vaccine if you are younger than age 65 years, are over 65 and have contact with an infant, are a healthcare worker, or simply want to be protected from whooping cough. After that, you need a Td booster dose every 10 years. Consult your caregiver if you have not had at least 3 tetanus and diphtheria-containing shots sometime in your life or have a deep or dirty wound.   HPV. This vaccine is recommended for males 13 through 45 years of age. This vaccine may be given to men 22 through 45 years of age who have not completed the 3 dose series. It is recommended for men through age 26   who have sex with men or whose immune system is weakened because of HIV infection, other illness, or medications. The vaccine is given in 3 doses over 6 months.   Measles, mumps, rubella (MMR). You need at least 1 dose of MMR if you were born in 1957 or later. You may also need a 2nd dose.   Meningococcal. If you are age 19 to 21 years and a first-year college student living in a residence hall, or have one of several medical conditions, you need to get vaccinated against meningococcal disease. You may also need additional booster doses.   Zoster (shingles). If you are age 60 years or older, you should get this vaccine.   Varicella (chickenpox). If you have never had chickenpox or you were vaccinated but received only 1 dose, talk to your caregiver to find out if you need this vaccine.   Hepatitis A. You need this vaccine if you have a specific risk factor for hepatitis A virus infection, or you simply wish to be protected from this disease. The vaccine is  usually given as 2 doses, 6 to 18 months apart.   Hepatitis B. You need this vaccine if you have a specific risk factor for hepatitis B virus infection or you simply wish to be protected from this disease. The vaccine is given in 3 doses, usually over 6 months.  Preventative Service / Frequency Ages 19 to 39  Blood pressure check.** / Every 1 to 2 years.   Lipid and cholesterol check.**/ Every 5 years beginning at age 20.   Skin self-exam. / Monthly.   Influenza immunization.** / Every year.   Pneumococcal polysaccharide immunization.** / 1 to 2 doses if you smoke cigarettes or if you have certain chronic medical conditions.   Tetanus, diphtheria, pertussis (Tdap,Td) immunization. / A one-time dose of Tdap vaccine. After that, you need a Td booster dose every 10 years.   HPV immunization. / 3 doses over 6 months, if 26 and younger.   Measles, mumps, rubella (MMR) immunization. / You need at least 1 dose of MMR if you were born in 1957 or later. You may also need a 2nd dose.   Meningococcal immunization. / 1 dose if you are age 19 to 21 years and a first-year college student living in a residence hall, or have one of several medical conditions, you need to get vaccinated against meningococcal disease. You may also need additional booster doses.   Varicella immunization. **/ Consult your caregiver.   Hepatitis A immunization. ** / Consult your caregiver. 2 doses, 6 to 18 months apart.   Hepatitis B immunization.** / Consult your caregiver. 3 doses usually over 6 months.  Ages 40 to 64  Blood pressure check.** / Every 1 to 2 years.   Lipid and cholesterol check.**/ Every 5 years beginning at age 20.   Fecal occult blood test (FOBT) of stool. / Every year beginning at age 50 and continuing until age 75. You may not have to do this test if you get colonoscopy every 10 years.   Flexible sigmoidoscopy** or colonoscopy.** / Every 5 years for a flexible sigmoidoscopy or every 10 years for  a colonoscopy beginning at age 50 and continuing until age 75.   Skin self-exam. / Monthly.   Influenza immunization.** / Every year.   Pneumococcal polysaccharide immunization.** / 1 to 2 doses if you smoke cigarettes or if you have certain chronic medical conditions.   Tetanus, diphtheria, pertussis (Tdap/Td) immunization.** / A one-time dose of   Tdap vaccine. After that, you need a Td booster dose every 10 years.   Measles, mumps, rubella (MMR) immunization. / You need at least 1 dose of MMR if you were born in 1957 or later. You may also need a 2nd dose.   Varicella immunization. **/ Consult your caregiver.   Meningococcal immunization.** / Consult your caregiver.   Hepatitis A immunization. ** / Consult your caregiver. 2 doses, 6 to 18 months apart.   Hepatitis B immunization.** / Consult your caregiver. 3 doses, usually over 6 months.  Ages 57 and over  Blood pressure check.** / Every 1 to 2 years.   Lipid and cholesterol check.**/ Every 5 years beginning at age 79.   Fecal occult blood test (FOBT) of stool. / Every year beginning at age 45 and continuing until age 62. You may not have to do this test if you get colonoscopy every 10 years.   Flexible sigmoidoscopy** or colonoscopy.** / Every 5 years for a flexible sigmoidoscopy or every 10 years for a colonoscopy beginning at age 1 and continuing until age 16.   Abdominal aortic aneurysm (AAA) screening.** / A one-time screening for ages 51 to 50 years who are current or former smokers.   Skin self-exam. / Monthly.   Influenza immunization.** / Every year.   Pneumococcal polysaccharide immunization.** / 1 dose at age 53 (or older) if you have never been vaccinated.   Tetanus, diphtheria, pertussis (Tdap, Td) immunization. / A one-time dose of Tdap vaccine if you are over 65 and have contact with an infant, are a Research scientist (physical sciences), or simply want to be protected from whooping cough. After that, you need a Td booster dose  every 10 years.   Varicella immunization. **/ Consult your caregiver.   Meningococcal immunization.** / Consult your caregiver.   Hepatitis A immunization. ** / Consult your caregiver. 2 doses, 6 to 18 months apart.   Hepatitis B immunization.** / Check with your caregiver. 3 doses, usually over 6 months.  **Family history and personal history of risk and conditions may change your caregiver's recommendations. Document Released: 07/02/2001 Document Revised: 01/16/2011 Document Reviewed: 10/01/2010 [facility] Patient Information 2012 [place], [place].  Infertility WHAT IS INFERTILITY?  Infertility is usually defined as not being able to get pregnant after trying for one year of regular sexual intercourse without the use of contraceptives. Or not being able to carry a pregnancy to term and have a baby. The infertility rate in the [place] is around 10%. Pregnancy is the result of a chain of events. A woman must release an egg from one of her ovaries (ovulation). The egg must be fertilized by the male sperm. Then it travels through a fallopian tube into the uterus (womb), where it attaches to the wall of the uterus and grows. A man must have enough sperm, and the sperm must join with (fertilize) the egg along the way, at the proper time. The fertilized egg must then become attached to the inside of the uterus. While this may seem simple, many things can happen to prevent pregnancy from occurring.  WHOSE PROBLEM IS IT?  About 20% of infertility cases are due to problems with the man (male factors) and 65% are due to problems with the woman (male factors). Other cases are due to a combination of male and male factors or to unknown causes.  WHAT CAUSES INFERTILITY IN MEN?  Infertility in men is often caused by problems with making enough normal sperm or getting the sperm to reach  the egg. Problems with sperm may exist from birth or develop later in life, due to illness or injury. Some men produce  no sperm, or produce too few sperm (oligospermia). Other problems include:  Sexual dysfunction.   Hormonal or endocrine problems.   Age. Male fertility decreases with age, but not at as young an age as male fertility.   Infection.   Congenital problems. Birth defect, such as absence of the tubes that carry the sperm (vas deferens).   Genetic/chromosomal problems.   Antisperm antibody problems.   Retrograde ejaculation (sperm go into the bladder).   Varicoceles, spematoceles, or tumors of the testicles.   Lifestyle can influence the number and quality of a man's sperm.   Alcohol and drugs can temporarily reduce sperm quality.   Environmental toxins, including pesticides and lead, may cause some cases of infertility in men.  WHAT CAUSES INFERTILITY IN WOMEN?   Problems with ovulation account for most infertility in women. Without ovulation, eggs are not available to be fertilized.   Signs of problems with ovulation include irregular menstrual periods or no periods at all.   Simple lifestyle factors, including stress, diet, or athletic training, can affect a woman's hormonal balance.   Age. Fertility begins to decrease in women in the early 64s and is worse after age 43.   Much less often, a hormonal imbalance from a serious medical problem, such as a pituitary gland tumor, thyroid or other chronic medical disease, can cause ovulation problems.   Pelvic infections.   Polycystic ovary syndrome (increase in male hormones, unable to ovulate).   Alcohol or illegal drugs.   Environmental toxins, radiation, pesticides, and certain chemicals.   Aging is an important factor in male infertility.   The ability of a woman's ovaries to produce eggs declines with age, especially after age 16. About one third of couples where the woman is over 35 will have problems with fertility.   By the time she reaches menopause when her monthly periods stop for good, a woman can no longer  produce eggs or become pregnant.   Other problems can also lead to infertility in women. If the fallopian tubes are blocked at one or both ends, the egg cannot travel through the tubes into the uterus. Scar tissue (adhesions) in the pelvis may cause blocked tubes. This may result from pelvic inflammatory disease, endometriosis, or surgery for an ectopic pregnancy (fertilized egg implanted outside the uterus) or any pelvic or abdominal surgery causing adhesions.   Fibroid tumors or polyps of the uterus.   Congenital (birth defect) abnormalities of the uterus.   Infection of the cervix (cervicitis).   Cervical stenosis (narrowing).   Abnormal cervical mucus.   Polycystic ovary syndrome.   Having sexual intercourse too often (every other day or 4 to 5 times a week).   Obesity.   Anorexia.   Poor nutrition.   Over exercising, with loss of body fat.   DES. Your mother received diethylstilbesterol hormone when pregnant with you.  HOW IS INFERTILITY TESTED?  If you have been trying to have a baby without success, you may want to seek medical help. You should not wait for one year of trying before seeing a health care provider if:  You are over 35.   You have reason to believe that there may be a fertility problem.  A medical evaluation may determine the reasons for a couple's infertility. Usually this process begins with:  Physical exams.   Medical histories of both partners.  Sexual histories of both partners.  If there is no obvious problem, like improperly timed intercourse or absence of ovulation, tests may be needed.   For a man, testing usually begins with tests of his semen to look at:   The number of sperm.   The shape of sperm.   Movement of his sperm.   Taking a complete medical and surgical history.   Physical examination.   Check for infection of the male reproductive organs.  Sometimes hormone tests are done.   For a woman, the first step in testing is  to find out if she is ovulating each month. There are several ways to do this. For example, she can keep track of changes in her morning body temperature and in the texture of her cervical mucus. Another tool is a home ovulation test kit, which can be bought at drug or grocery stores.   Checks of ovulation can also be done in the doctor's office, using blood tests for hormone levels or ultrasound tests of the ovaries. If the woman is ovulating, more tests will need to be done. Some common male tests include:   Hysterosalpingogram: An x-ray of the fallopian tubes and uterus after they are injected with dye. It shows if the tubes are open and shows the shape of the uterus.   Laparoscopy: An exam of the tubes and other male organs for disease. A lighted tube called a laparoscope is used to see inside the abdomen.   Endometrial biopsy: Sample of uterus tissue taken on the first day of the menstrual period, to see if the tissue indicates you are ovulating.   Transvaginal ultrasound: Examines the male organs.   Hysteroscopy: Uses a lighted tube to examine the cervix and inside the uterus, to see if there are any abnormalities inside the uterus.  TREATMENT  Depending on the test results, different treatments can be suggested. The type of treatment depends on the cause. 85 to 90% of infertility cases are treated with drugs or surgery.   Various fertility drugs may be used for women with ovulation problems. It is important to talk with your caregiver about the drug to be used. You should understand the drug's benefits and side effects. Depending on the type of fertility drug and the dosage of the drug used, multiple births (twins or multiples) can occur in some women.   If needed, surgery can be done to repair damage to a woman's ovaries, fallopian tubes, cervix, or uterus.   Surgery or medical treatment for endometriosis or polycystic ovary syndrome. Sometimes a man has an infertility problem that  can be corrected with medicine or by surgery.   Intrauterine insemination (IUI) of sperm, timed with ovulation.   Change in lifestyle, if that is the cause (lose weight, increase exercise, and stop smoking, drinking excessively, or taking illegal drugs).   Other types of surgery:   Removing growths inside and on the uterus.   Removing scar tissue from inside of the uterus.   Fixing blocked tubes.   Removing scar tissue in the pelvis and around the male organs.  WHAT IS ASSISTED REPRODUCTIVE TECHNOLOGY (ART)?  Assisted reproductive technology (ART) is another form of special methods used to help infertile couples. ART involves handling both the woman's eggs and the man's sperm. Success rates vary and depend on many factors. ART can be expensive and time-consuming. But ART has made it possible for many couples to have children that otherwise would not have been conceived. Some methods are  listed below:  In vitro fertilization (IVF). IVF is often used when a woman's fallopian tubes are blocked or when a man has low sperm counts. A drug is used to stimulate the ovaries to produce multiple eggs. Once mature, the eggs are removed and placed in a culture dish with the man's sperm for fertilization. After about 40 hours, the eggs are examined to see if they have become fertilized by the sperm and are dividing into cells. These fertilized eggs (embryos) are then placed in the woman's uterus. This bypasses the fallopian tubes.   Gamete intrafallopian transfer (GIFT) is similar to IVF, but used when the woman has at least one normal fallopian tube. Three to five eggs are placed in the fallopian tube, along with the man's sperm, for fertilization inside the woman's body.   Zygote intrafallopian transfer (ZIFT), also called tubal embryo transfer, combines IVF and GIFT. The eggs retrieved from the woman's ovaries are fertilized in the lab and placed in the fallopian tubes rather than in the uterus.   ART  procedures sometimes involve the use of donor eggs (eggs from another woman) or previously frozen embryos. Donor eggs may be used if a woman has impaired ovaries or has a genetic disease that could be passed on to her baby.   When performing ART, you are at higher risk for resulting in multiple pregnancies, twins, triplets or more.   Intracytoplasma sperm injection is a procedure that injects a single sperm into the egg to fertilize it.   Embryo transplant is a procedure that starts after growing an embryo in a special media (chemical solution) developed to keep the embryo alive for 2 to 5 days, and then transplanting it into the uterus.  In cases where a cause cannot be found and pregnancy does not occur, adoption may be a consideration. Document Released: 05/09/2003 Document Revised: 01/16/2011 Document Reviewed: 04/04/2009 [facility] Patient Information 2012 [place], [place].

## 2011-06-17 NOTE — Progress Notes (Signed)
Subjective:    Patient ID: Juan Velazquez, male    DOB: January 05, 1966, 46 y.o.   MRN: [id number]  HPI Pt here for cpe and labs.  No complaints.      Review of Systems Review of Systems  Constitutional: Negative for activity change, appetite change and fatigue.  HENT: Negative for hearing loss, congestion, tinnitus and ear discharge.  dentist q42m Eyes: Negative for visual disturbance (see optho q1y -- vision corrected to 20/20 with glasses).  Respiratory: Negative for cough, chest tightness and shortness of breath.   Cardiovascular: Negative for chest pain, palpitations and leg swelling.  Gastrointestinal: Negative for abdominal pain, diarrhea, constipation and abdominal distention.  Genitourinary: Negative for urgency, frequency, decreased urine volume and difficulty urinating.  Musculoskeletal: Negative for back pain, arthralgias and gait problem.  Skin: Negative for color change, pallor and rash.  Neurological: Negative for dizziness, light-headedness, numbness and headaches.  Hematological: Negative for adenopathy. Does not bruise/bleed easily.  Psychiatric/Behavioral: Negative for suicidal ideas, confusion, sleep disturbance, self-injury, dysphoric mood, decreased concentration and agitation.     Family History  Problem Relation Age of Onset  . Diabetes Mother   . Diabetes type II Brother   . Diabetes Sister   . Colon cancer Maternal Aunt   . Hypertension     History   Social History  . Marital Status: Married    Spouse Name: N/A    Number of Children: N/A  . Years of Education: N/A   Occupational History  . Not on file.   Social History Main Topics  . Smoking status: Never Smoker   . Smokeless tobacco: Never Used  . Alcohol Use: No  . Drug Use: No  . Sexually Active: Not on file   Other Topics Concern  . Not on file   Social History Narrative  . No narrative on file   Past Medical History  Diagnosis Date  . Hyperlipidemia   . Allergy   . Asthma     . GERD (gastroesophageal reflux disease)        Objective:   Physical Exam  BP 106/68  Pulse 70  Temp(Src) 98.7 F (37.1 C) (Oral)  Ht 5\' 10"  (1.778 m)  Wt 226 lb (102.513 kg)  BMI 32.43 kg/m2  SpO2 97%  General Appearance:    Alert, cooperative, no distress, appears stated age  Head:    Normocephalic, without obvious abnormality, atraumatic  Eyes:    PERRL, conjunctiva/corneas clear, EOM's intact, fundi    benign, both eyes       Ears:    Normal TM's and external ear canals, both ears  Nose:   Nares normal, septum midline, mucosa normal, no drainage   or sinus tenderness  Throat:   Lips, mucosa, and tongue normal; teeth and gums normal  Neck:   Supple, symmetrical, trachea midline, no adenopathy;       thyroid:  No enlargement/tenderness/nodules; no carotid   bruit or JVD  Back:     Symmetric, no curvature, ROM normal, no CVA tenderness  Lungs:     Clear to auscultation bilaterally, respirations unlabored  Chest wall:    No tenderness or deformity  Heart:    Regular rate and rhythm, S1 and S2 normal, no murmur, rub   or gallop  Abdomen:     Soft, non-tender, bowel sounds active all four quadrants,    no masses, no organomegaly  Genitalia:    Normal male without lesion, discharge or tenderness  Rectal:    Normal  tone, normal prostate, no masses or tenderness;   guaiac negative stool  Extremities:   Extremities normal, atraumatic, no cyanosis or edema  Pulses:   2+ and symmetric all extremities  Skin:   Skin color, texture, turgor normal, no rashes or lesions  Lymph nodes:   Cervical, supraclavicular, and axillary nodes normal  Neurologic:   CNII-XII intact. Normal strength, sensation and reflexes      throughout        Assessment & Plan:  cpe-- ghm utd          Check labs

## 2011-06-17 NOTE — Assessment & Plan Note (Signed)
Stable Pt has not needed inhalers at all

## 2011-06-17 NOTE — Assessment & Plan Note (Signed)
Check labs Cont' meds 

## 2011-07-08 ENCOUNTER — Other Ambulatory Visit: Payer: Self-pay | Admitting: Family Medicine

## 2011-07-08 MED ORDER — ATORVASTATIN CALCIUM 10 MG PO TABS: ORAL_TABLET | ORAL | Status: DC

## 2011-07-08 NOTE — Telephone Encounter (Signed)
Labs current--Rx faxed     KP     

## 2011-07-18 ENCOUNTER — Ambulatory Visit (INDEPENDENT_AMBULATORY_CARE_PROVIDER_SITE_OTHER): Payer: Federal, State, Local not specified - PPO | Admitting: *Deleted

## 2011-07-18 DIAGNOSIS — Z0000 Encounter for general adult medical examination without abnormal findings: Secondary | ICD-10-CM

## 2011-07-18 DIAGNOSIS — Z23 Encounter for immunization: Secondary | ICD-10-CM

## 2011-08-08 ENCOUNTER — Encounter: Payer: Self-pay | Admitting: Family Medicine

## 2011-08-08 ENCOUNTER — Other Ambulatory Visit: Payer: Self-pay | Admitting: Cardiology

## 2011-08-08 ENCOUNTER — Ambulatory Visit (INDEPENDENT_AMBULATORY_CARE_PROVIDER_SITE_OTHER): Payer: Federal, State, Local not specified - PPO | Admitting: Family Medicine

## 2011-08-08 ENCOUNTER — Encounter (INDEPENDENT_AMBULATORY_CARE_PROVIDER_SITE_OTHER): Payer: Federal, State, Local not specified - PPO

## 2011-08-08 VITALS — BP 110/70 | HR 80 | Temp 98.2°F | Wt 232.2 lb

## 2011-08-08 DIAGNOSIS — M79609 Pain in unspecified limb: Secondary | ICD-10-CM

## 2011-08-08 DIAGNOSIS — M79669 Pain in unspecified lower leg: Secondary | ICD-10-CM

## 2011-08-08 DIAGNOSIS — M7989 Other specified soft tissue disorders: Secondary | ICD-10-CM

## 2011-08-08 NOTE — Progress Notes (Signed)
  Subjective:     Juan Velazquez is a 46 y.o. male who presents for evaluation of pain and swelling R foot and calf . Symptoms have been present since last Thursday---he almost fell playing softball.  The patient is able to ambulate. Risk factors for hypercoagulable state include: trauma to area.  Pt past hx, meds, all, surg, fam etc reviewed  Review of Systems Review of Systems  Constitutional: Negative for activity change, appetite change and fatigue.  HENT: Negative for hearing loss, congestion, tinnitus and ear discharge.   Eyes: Negative for visual disturbance  Respiratory: Negative for cough, chest tightness and shortness of breath.   Cardiovascular: Negative for chest pain, palpitations and leg swelling.  Gastrointestinal: Negative for abdominal pain, diarrhea, constipation and abdominal distention.  Genitourinary: Negative for urgency, frequency, decreased urine volume and difficulty urinating.  Musculoskeletal: Negative for back pain, arthralgias and gait problem.  Skin: Negative for color change, pallor and rash.  Neurological: Negative for dizziness, light-headedness, numbness and headaches.  Hematological: Negative for adenopathy. Does not bruise/bleed easily.  Psychiatric/Behavioral: Negative for suicidal ideas, confusion, sleep disturbance, self-injury, dysphoric mood, decreased concentration and agitation.        Objective:    Gen--AAOx3  ND Cor--+S1S2  No murmur Lung--CTAB.L  No RRW Rext----no errythema,  + calf tenderness,  No swelling   Assessment:    R calf pain---.    Plan:    check doppler Doubt dvt but will r/o secondary to calf pain If neg---refer to sports med

## 2011-08-14 ENCOUNTER — Other Ambulatory Visit: Payer: Self-pay | Admitting: Family Medicine

## 2011-08-14 DIAGNOSIS — M25579 Pain in unspecified ankle and joints of unspecified foot: Secondary | ICD-10-CM

## 2011-10-08 ENCOUNTER — Telehealth: Payer: Self-pay | Admitting: Family Medicine

## 2011-10-08 DIAGNOSIS — M25579 Pain in unspecified ankle and joints of unspecified foot: Secondary | ICD-10-CM

## 2011-10-08 NOTE — Telephone Encounter (Signed)
Pt would like referral to ortho for his ankle injury. Call b ack # [phone]

## 2011-10-09 NOTE — Telephone Encounter (Signed)
Referral put in     KP 

## 2011-10-09 NOTE — Telephone Encounter (Signed)
Patient's original referral to Sports Medicine was in March-2013, which patient refused, so referral no longer active.  He has now changed his mind.  Will you please enter a new referral?

## 2011-10-11 ENCOUNTER — Encounter: Payer: Self-pay | Admitting: Family Medicine

## 2011-10-11 ENCOUNTER — Ambulatory Visit: Payer: Federal, State, Local not specified - PPO | Admitting: Family Medicine

## 2011-10-11 ENCOUNTER — Ambulatory Visit (INDEPENDENT_AMBULATORY_CARE_PROVIDER_SITE_OTHER): Payer: Federal, State, Local not specified - PPO | Admitting: Family Medicine

## 2011-10-11 ENCOUNTER — Other Ambulatory Visit: Payer: Self-pay | Admitting: Family Medicine

## 2011-10-11 VITALS — BP 134/85 | HR 80 | Ht 69.0 in | Wt 220.0 lb

## 2011-10-11 DIAGNOSIS — S96819A Strain of other specified muscles and tendons at ankle and foot level, unspecified foot, initial encounter: Secondary | ICD-10-CM

## 2011-10-11 DIAGNOSIS — S86011A Strain of right Achilles tendon, initial encounter: Secondary | ICD-10-CM

## 2011-10-11 DIAGNOSIS — S93499A Sprain of other ligament of unspecified ankle, initial encounter: Secondary | ICD-10-CM

## 2011-10-11 NOTE — Patient Instructions (Signed)
You have been scheduled for an appointment with Dr. Thurston Hole at [facility] on 10/15/11 at 9:30 am.  Their phone number is [phone] and address is [address] [place] [address].

## 2011-10-15 MED ORDER — FLUTICASONE PROPIONATE 50 MCG/ACT NA SUSP: 2.0000 | Freq: Every day | NASAL | Status: DC

## 2011-10-15 NOTE — Telephone Encounter (Signed)
Refill: Fluticasone prop spray. Use 2 sprays in each nostril every day. Qty 16. Last fill 09-21-10

## 2011-10-16 ENCOUNTER — Encounter: Payer: Self-pay | Admitting: Family Medicine

## 2011-10-16 DIAGNOSIS — S86011A Strain of right Achilles tendon, initial encounter: Secondary | ICD-10-CM | POA: Insufficient documentation

## 2011-10-16 NOTE — Assessment & Plan Note (Signed)
Patient now 2 1/2 months out from rupture with significant deficits in strength and function.  Do not think placing him in cast or boot with plantarflexion would allow for healing this far out from his injury.  I advised he move ahead with surgical consultation and probable repair.  Patient referred to Dr. Thurston Hole early next week for further evaluation.  Addendum: Given amount of retraction, patient will likely need a gastroc slide to free up Achilles gastrocnemius complex to make up for shortening from remote Achilles rupture - as such, Dr. Thurston Hole discussed case with foot/ankle specialist Dr. Lestine Box who saw the patient and set him up for surgery.

## 2011-10-16 NOTE — Progress Notes (Signed)
Subjective:    Patient ID: Juan Velazquez, male    DOB: [birth year], 46 y.o.   MRN: [id number]  PCP: Dr. Laury Axon  HPI 46 yo M here for right achilles injury.  Patient reports in March 2013 while playing softball he took a step while running bases and felt like he had dropped the bat on his right heel.  Took one more step and fell to the ground, unable to continue because of pain in his right posterior heel. No prior injuries to right heel. + swelling but no bruising. Has tried icing, heat. Went to urgent care and was advised he had strained his achilles but not torn it. He has continued to struggle with pain and function - unable to do a calf raise on right side. Has not returned to softball.  Past Medical History  Diagnosis Date  . Hyperlipidemia   . Allergy   . Asthma   . GERD (gastroesophageal reflux disease)     Current Outpatient Prescriptions on File Prior to Visit  Medication Sig Dispense Refill  . albuterol (PROVENTIL HFA) 108 (90 BASE) MCG/ACT inhaler Inhale 2 puffs into the lungs every 6 (six) hours as needed.  1 Inhaler  2  . atorvastatin (LIPITOR) 10 MG tablet TAKE 1 TABLET AT BEDTIME  30 tablet  5  . levocetirizine (XYZAL) 5 MG tablet TAKE 1 TABLET IN THE EVENING  30 tablet  3  . beclomethasone (QVAR) 40 MCG/ACT inhaler Inhale 2 puffs into the lungs 2 (two) times daily.  1 Inhaler  5  . famotidine (PEPCID) 10 MG tablet Take 10 mg by mouth daily.        . fluticasone (FLONASE) 50 MCG/ACT nasal spray Place 2 sprays into the nose daily.  16 g  12    History reviewed. No pertinent past surgical history.  No Known Allergies  History   Social History  . Marital Status: Married    Spouse Name: N/A    Number of Children: N/A  . Years of Education: N/A   Occupational History  . dept [employer] --w/s    Social History Main Topics  . Smoking status: Never Smoker   . Smokeless tobacco: Never Used  . Alcohol Use: Yes     rare  . Drug Use: No  . Sexually Active: Yes  -- Male partner(s)   Other Topics Concern  . Not on file   Social History Narrative   Exercise-- 3x a week    Family History  Problem Relation Age of Onset  . Diabetes Mother   . Hypertension Mother   . Diabetes type II Brother   . Diabetes Brother   . Hypertension Brother   . Diabetes Sister   . Colon cancer Maternal Aunt   . Heart disease Father     defibrillator    BP 134/85  Pulse 80  Ht 5\' 9"  (1.753 m)  Wt 220 lb (99.791 kg)  BMI 32.49 kg/m2  Review of Systems See HPI above.    Objective:   Physical Exam Gen: NAD  R ankle: Palpable defect within achilles tendon about 2-3 cm proximal to insertion on calcaneus.  Nodule distal to this.  No bruising. FROM but unable to do a calf raise on right, decreased strength with plantar flexion. Mild TTP in defect and just proximal and distal to this. Negative ant drawer and talar tilt.   Negative syndesmotic compression. Thompsons test positive. NV intact distally.  MSK u/s: Completely torn right achilles tendon with retraction  proximally and distally - approximately 3.5cm separating the torn segments from one another.  Images saved, printed.    Assessment & Plan:  1. Right complete achilles tendon rupture - Patient now 2 1/2 months out from rupture with significant deficits in strength and function.  Do not think placing him in cast or boot with plantarflexion would allow for healing this far out from his injury.  I advised he move ahead with surgical consultation and probable repair.  Patient referred to Dr. Thurston Hole early next week for further evaluation.  Addendum: Given amount of retraction, patient will likely need a gastroc slide to free up Achilles gastrocnemius complex to make up for shortening from remote Achilles rupture - as such, Dr. Thurston Hole discussed case with foot/ankle specialist Dr. Lestine Box who saw the patient and set him up for surgery.

## 2011-10-19 HISTORY — PX: ACHILLES TENDON REPAIR: SUR1153

## 2012-02-19 ENCOUNTER — Other Ambulatory Visit: Payer: Self-pay | Admitting: Family Medicine

## 2012-02-20 ENCOUNTER — Telehealth: Payer: Self-pay

## 2012-02-20 NOTE — Telephone Encounter (Signed)
Pt requested refill. Advised pt Rx sent. Pt asked did he need to call in Rx or [facility] I advised him to ask [facility].       MW

## 2012-02-20 NOTE — Telephone Encounter (Signed)
Duplicate

## 2012-02-21 ENCOUNTER — Other Ambulatory Visit: Payer: Self-pay | Admitting: Family Medicine

## 2012-03-11 ENCOUNTER — Other Ambulatory Visit: Payer: Self-pay

## 2012-03-11 MED ORDER — ALBUTEROL SULFATE HFA 108 (90 BASE) MCG/ACT IN AERS: 2.0000 | INHALATION_SPRAY | Freq: Four times a day (QID) | RESPIRATORY_TRACT | Status: DC | PRN

## 2012-03-11 NOTE — Telephone Encounter (Signed)
Rx sent pt aware.   MW  

## 2012-03-30 ENCOUNTER — Other Ambulatory Visit: Payer: Self-pay | Admitting: Family Medicine

## 2012-03-30 MED ORDER — ATORVASTATIN CALCIUM 10 MG PO TABS: ORAL_TABLET | ORAL | Status: DC

## 2012-03-30 NOTE — Telephone Encounter (Signed)
refill Atorvastatin 10mg  Tablet #30 take one tablet at bedtime last fill 10.7.13--last labs 1.28.13 nothing scheduled--last ov 3.21.13-acute

## 2012-06-19 ENCOUNTER — Other Ambulatory Visit: Payer: Self-pay | Admitting: Family Medicine

## 2012-07-01 ENCOUNTER — Encounter: Payer: Self-pay | Admitting: Family Medicine

## 2012-07-01 ENCOUNTER — Ambulatory Visit (INDEPENDENT_AMBULATORY_CARE_PROVIDER_SITE_OTHER): Payer: Federal, State, Local not specified - PPO | Admitting: Family Medicine

## 2012-07-01 VITALS — BP 114/80 | HR 80 | Temp 98.5°F | Wt 237.0 lb

## 2012-07-01 DIAGNOSIS — E785 Hyperlipidemia, unspecified: Secondary | ICD-10-CM

## 2012-07-01 DIAGNOSIS — Z0000 Encounter for general adult medical examination without abnormal findings: Secondary | ICD-10-CM

## 2012-07-01 DIAGNOSIS — K921 Melena: Secondary | ICD-10-CM

## 2012-07-01 LAB — CBC WITH DIFFERENTIAL/PLATELET
Basophils Relative: 1 % (ref 0.0–3.0)
Eosinophils Relative: 3 % (ref 0.0–5.0)
HCT: 33.2 % — ABNORMAL LOW (ref 39.0–52.0)
Hemoglobin: 10.7 g/dL — ABNORMAL LOW (ref 13.0–17.0)
Lymphocytes Relative: 30.7 % (ref 12.0–46.0)
Lymphs Abs: 1 10*3/uL (ref 0.7–4.0)
Monocytes Relative: 14.1 % — ABNORMAL HIGH (ref 3.0–12.0)
Neutro Abs: 1.7 10*3/uL (ref 1.4–7.7)
RBC: 4.14 Mil/uL — ABNORMAL LOW (ref 4.22–5.81)
WBC: 3.4 10*3/uL — ABNORMAL LOW (ref 4.5–10.5)

## 2012-07-01 LAB — BASIC METABOLIC PANEL
Calcium: 9.1 mg/dL (ref 8.4–10.5)
GFR: 68.46 mL/min (ref >60.00)
Glucose, Bld: 89 mg/dL (ref 70–99)
Potassium: 3.8 mEq/L (ref 3.5–5.1)
Sodium: 136 mEq/L (ref 135–145)

## 2012-07-01 LAB — LIPID PANEL
Cholesterol: 211 mg/dL — ABNORMAL HIGH (ref 0–200)
VLDL: 15.6 mg/dL (ref 0.0–40.0)

## 2012-07-01 LAB — H. PYLORI ANTIBODY, IGG: H Pylori IgG: NEGATIVE

## 2012-07-01 LAB — HEPATIC FUNCTION PANEL
ALT: 24 U/L (ref 0–53)
AST: 43 U/L — ABNORMAL HIGH (ref 0–37)
Albumin: 3.9 g/dL (ref 3.5–5.2)
Alkaline Phosphatase: 76 U/L (ref 39–117)

## 2012-07-01 LAB — POCT URINALYSIS DIPSTICK
Bilirubin, UA: NEGATIVE
Blood, UA: NEGATIVE
Glucose, UA: NEGATIVE
Nitrite, UA: NEGATIVE
Spec Grav, UA: 1.01
Urobilinogen, UA: 0.2

## 2012-07-01 LAB — PSA: PSA: 1.43 ng/mL (ref 0.10–4.00)

## 2012-07-01 LAB — TSH: TSH: 2.3 u[IU]/mL (ref 0.35–5.50)

## 2012-07-01 MED ORDER — ATORVASTATIN CALCIUM 10 MG PO TABS: ORAL_TABLET | ORAL | Status: DC

## 2012-07-01 NOTE — Assessment & Plan Note (Signed)
Check labs----pt has been off lipitor 3 months Refill lipitor

## 2012-07-01 NOTE — Assessment & Plan Note (Signed)
prilosec otc Refer GI

## 2012-07-01 NOTE — Progress Notes (Signed)
Subjective:    Patient ID: Juan Velazquez, male    DOB: 08-11-1965, 47 y.o.   MRN: [id number]  HPI Pt here for cpe and labs. No complaints.   Past Medical History  Diagnosis Date  . Hyperlipidemia   . Allergy   . Asthma   . GERD (gastroesophageal reflux disease)    History  Substance Use Topics  . Smoking status: Never Smoker   . Smokeless tobacco: Never Used  . Alcohol Use: Yes     Comment: rare   Family History  Problem Relation Age of Onset  . Diabetes Mother   . Hypertension Mother   . Diabetes type II Brother   . Diabetes Brother   . Hypertension Brother   . Diabetes Sister   . Colon cancer Maternal Aunt   . Heart disease Father     defibrillator   Current Outpatient Prescriptions on File Prior to Visit  Medication Sig Dispense Refill  . albuterol (PROVENTIL HFA) 108 (90 BASE) MCG/ACT inhaler Inhale 2 puffs into the lungs every 6 (six) hours as needed.  1 Inhaler  2  . fluticasone (FLONASE) 50 MCG/ACT nasal spray Place 2 sprays into the nose daily.  16 g  12  . levocetirizine (XYZAL) 5 MG tablet TAKE 1 TABLET IN THE EVENING  30 tablet  3   No current facility-administered medications on file prior to visit.      Review of Systems Review of Systems  Constitutional: Negative for activity change, appetite change and fatigue.  HENT: Negative for hearing loss, congestion, tinnitus and ear discharge.  dentist q41m Eyes: Negative for visual disturbance (see optho q1y -- vision corrected to 20/20 with glasses).  Respiratory: Negative for cough, chest tightness and shortness of breath.   Cardiovascular: Negative for chest pain, palpitations and leg swelling.  Gastrointestinal: Negative for abdominal pain, diarrhea, constipation and abdominal distention.  Genitourinary: Negative for urgency, frequency, decreased urine volume and difficulty urinating.  Musculoskeletal: Negative for back pain, arthralgias and gait problem.  Skin: Negative for color change, pallor and  rash.  Neurological: Negative for dizziness, light-headedness, numbness and headaches.  Hematological: Negative for adenopathy. Does not bruise/bleed easily.  Psychiatric/Behavioral: Negative for suicidal ideas, confusion, sleep disturbance, self-injury, dysphoric mood, decreased concentration and agitation.          Objective:   Physical Exam  BP 114/80  Pulse 80  Temp(Src) 98.5 F (36.9 C) (Oral)  Wt 237 lb (107.502 kg)  BMI 34.98 kg/m2  SpO2 98% General appearance: alert, cooperative, appears stated age and no distress Head: Normocephalic, without obvious abnormality, atraumatic Eyes: conjunctivae/corneas clear. PERRL, EOM's intact. Fundi benign. Ears: normal TM's and external ear canals both ears Nose: Nares normal. Septum midline. Mucosa normal. No drainage or sinus tenderness. Throat: lips, mucosa, and tongue normal; teeth and gums normal Neck: no adenopathy, no carotid bruit, no JVD, supple, symmetrical, trachea midline and thyroid not enlarged, symmetric, no tenderness/mass/nodules Back: symmetric, no curvature. ROM normal. No CVA tenderness. Lungs: clear to auscultation bilaterally Chest wall: no tenderness Heart: regular rate and rhythm, S1, S2 normal, no murmur, click, rub or gallop Abdomen: soft, non-tender; bowel sounds normal; no masses,  no organomegaly Male genitalia: penis: no lesions or discharge. testes: no masses or tenderness. no hernias Rectal: normal tone, normal prostate, no masses or tenderness and soft brown guaiac negative stool noted Extremities: extremities normal, atraumatic, no cyanosis or edema Pulses: 2+ and symmetric Skin: Skin color, texture, turgor normal. No rashes or lesions Lymph nodes: Cervical,  supraclavicular, and axillary nodes normal. Neurologic: Alert and oriented X 3, normal strength and tone. Normal symmetric reflexes. Normal coordination and gait Psych--no anxiety, no depression      Assessment & Plan:  cpe--  Check labs             ghm utd            See AVS

## 2012-07-01 NOTE — Patient Instructions (Addendum)
Preventive Care for Adults, Male A healthy lifestyle and preventive care can promote health and wellness. Preventive health guidelines for men include the following key practices:  A routine yearly physical is a good way to check with your caregiver about your health and preventative screening. It is a chance to share any concerns and updates on your health, and to receive a thorough exam.  Visit your dentist for a routine exam and preventative care every 6 months. Brush your teeth twice a day and floss once a day. Good oral hygiene prevents tooth decay and gum disease.  The frequency of eye exams is based on your age, health, family medical history, use of contact lenses, and other factors. Follow your caregiver's recommendations for frequency of eye exams.  Eat a healthy diet. Foods like vegetables, fruits, whole grains, low-fat dairy products, and lean protein foods contain the nutrients you need without too many calories. Decrease your intake of foods high in solid fats, added sugars, and salt. Eat the right amount of calories for you.Get information about a proper diet from your caregiver, if necessary.  Regular physical exercise is one of the most important things you can do for your health. Most adults should get at least 150 minutes of moderate-intensity exercise (any activity that increases your heart rate and causes you to sweat) each week. In addition, most adults need muscle-strengthening exercises on 2 or more days a week.  Maintain a healthy weight. The body mass index (BMI) is a screening tool to identify possible weight problems. It provides an estimate of body fat based on height and weight. Your caregiver can help determine your BMI, and can help you achieve or maintain a healthy weight.For adults 20 years and older:  A BMI below 18.5 is considered underweight.  A BMI of 18.5 to 24.9 is normal.  A BMI of 25 to 29.9 is considered overweight.  A BMI of 30 and above is  considered obese.  Maintain normal blood lipids and cholesterol levels by exercising and minimizing your intake of saturated fat. Eat a balanced diet with plenty of fruit and vegetables. Blood tests for lipids and cholesterol should begin at age 20 and be repeated every 5 years. If your lipid or cholesterol levels are high, you are over 50, or you are a high risk for heart disease, you may need your cholesterol levels checked more frequently.Ongoing high lipid and cholesterol levels should be treated with medicines if diet and exercise are not effective.  If you smoke, find out from your caregiver how to quit. If you do not use tobacco, do not start.  If you choose to drink alcohol, do not exceed 2 drinks per day. One drink is considered to be 12 ounces (355 mL) of beer, 5 ounces (148 mL) of wine, or 1.5 ounces (44 mL) of liquor.  Avoid use of street drugs. Do not share needles with anyone. Ask for help if you need support or instructions about stopping the use of drugs.  High blood pressure causes heart disease and increases the risk of stroke. Your blood pressure should be checked at least every 1 to 2 years. Ongoing high blood pressure should be treated with medicines, if weight loss and exercise are not effective.  If you are 45 to 46 years old, ask your caregiver if you should take aspirin to prevent heart disease.  Diabetes screening involves taking a blood sample to check your fasting blood sugar level. This should be done once every 3 years,   after age 45, if you are within normal weight and without risk factors for diabetes. Testing should be considered at a younger age or be carried out more frequently if you are overweight and have at least 1 risk factor for diabetes.  Colorectal cancer can be detected and often prevented. Most routine colorectal cancer screening begins at the age of 50 and continues through age 75. However, your caregiver may recommend screening at an earlier age if you  have risk factors for colon cancer. On a yearly basis, your caregiver may provide home test kits to check for hidden blood in the stool. Use of a small camera at the end of a tube, to directly examine the colon (sigmoidoscopy or colonoscopy), can detect the earliest forms of colorectal cancer. Talk to your caregiver about this at age 50, when routine screening begins. Direct examination of the colon should be repeated every 5 to 10 years through age 75, unless early forms of pre-cancerous polyps or small growths are found.  Hepatitis C blood testing is recommended for all people born from 1945 through 1965 and any individual with known risks for hepatitis C.  Practice safe sex. Use condoms and avoid high-risk sexual practices to reduce the spread of sexually transmitted infections (STIs). STIs include gonorrhea, chlamydia, syphilis, trichomonas, herpes, HPV, and human immunodeficiency virus (HIV). Herpes, HIV, and HPV are viral illnesses that have no cure. They can result in disability, cancer, and death.  A one-time screening for abdominal aortic aneurysm (AAA) and surgical repair of large AAAs by sound wave imaging (ultrasonography) is recommended for ages 65 to 75 years who are current or former smokers.  Healthy men should no longer receive prostate-specific antigen (PSA) blood tests as part of routine cancer screening. Consult with your caregiver about prostate cancer screening.  Testicular cancer screening is not recommended for adult males who have no symptoms. Screening includes self-exam, caregiver exam, and other screening tests. Consult with your caregiver about any symptoms you have or any concerns you have about testicular cancer.  Use sunscreen with skin protection factor (SPF) of 30 or more. Apply sunscreen liberally and repeatedly throughout the day. You should seek shade when your shadow is shorter than you. Protect yourself by wearing long sleeves, pants, a wide-brimmed hat, and  sunglasses year round, whenever you are outdoors.  Once a month, do a whole body skin exam, using a mirror to look at the skin on your back. Notify your caregiver of new moles, moles that have irregular borders, moles that are larger than a pencil eraser, or moles that have changed in shape or color.  Stay current with required immunizations.  Influenza. You need a dose every fall (or winter). The composition of the flu vaccine changes each year, so being vaccinated once is not enough.  Pneumococcal polysaccharide. You need 1 to 2 doses if you smoke cigarettes or if you have certain chronic medical conditions. You need 1 dose at age 65 (or older) if you have never been vaccinated.  Tetanus, diphtheria, pertussis (Tdap, Td). Get 1 dose of Tdap vaccine if you are younger than age 65 years, are over 65 and have contact with an infant, are a healthcare worker, or simply want to be protected from whooping cough. After that, you need a Td booster dose every 10 years. Consult your caregiver if you have not had at least 3 tetanus and diphtheria-containing shots sometime in your life or have a deep or dirty wound.  HPV. This vaccine is recommended   for males 13 through 46 years of age. This vaccine may be given to men 22 through 46 years of age who have not completed the 3 dose series. It is recommended for men through age 26 who have sex with men or whose immune system is weakened because of HIV infection, other illness, or medications. The vaccine is given in 3 doses over 6 months.  Measles, mumps, rubella (MMR). You need at least 1 dose of MMR if you were born in 1957 or later. You may also need a 2nd dose.  Meningococcal. If you are age 19 to 21 years and a first-year college student living in a residence hall, or have one of several medical conditions, you need to get vaccinated against meningococcal disease. You may also need additional booster doses.  Zoster (shingles). If you are age 60 years or  older, you should get this vaccine.  Varicella (chickenpox). If you have never had chickenpox or you were vaccinated but received only 1 dose, talk to your caregiver to find out if you need this vaccine.  Hepatitis A. You need this vaccine if you have a specific risk factor for hepatitis A virus infection, or you simply wish to be protected from this disease. The vaccine is usually given as 2 doses, 6 to 18 months apart.  Hepatitis B. You need this vaccine if you have a specific risk factor for hepatitis B virus infection or you simply wish to be protected from this disease. The vaccine is given in 3 doses, usually over 6 months. Preventative Service / Frequency Ages 19 to 39  Blood pressure check.** / Every 1 to 2 years.  Lipid and cholesterol check.** / Every 5 years beginning at age 20.  Hepatitis C blood test.** / For any individual with known risks for hepatitis C.  Skin self-exam. / Monthly.  Influenza immunization.** / Every year.  Pneumococcal polysaccharide immunization.** / 1 to 2 doses if you smoke cigarettes or if you have certain chronic medical conditions.  Tetanus, diphtheria, pertussis (Tdap,Td) immunization. / A one-time dose of Tdap vaccine. After that, you need a Td booster dose every 10 years.  HPV immunization. / 3 doses over 6 months, if 26 and younger.  Measles, mumps, rubella (MMR) immunization. / You need at least 1 dose of MMR if you were born in 1957 or later. You may also need a 2nd dose.  Meningococcal immunization. / 1 dose if you are age 19 to 21 years and a first-year college student living in a residence hall, or have one of several medical conditions, you need to get vaccinated against meningococcal disease. You may also need additional booster doses.  Varicella immunization.** / Consult your caregiver.  Hepatitis A immunization.** / Consult your caregiver. 2 doses, 6 to 18 months apart.  Hepatitis B immunization.** / Consult your caregiver. 3 doses  usually over 6 months. Ages 40 to 64  Blood pressure check.** / Every 1 to 2 years.  Lipid and cholesterol check.** / Every 5 years beginning at age 20.  Fecal occult blood test (FOBT) of stool. / Every year beginning at age 50 and continuing until age 75. You may not have to do this test if you get colonoscopy every 10 years.  Flexible sigmoidoscopy** or colonoscopy.** / Every 5 years for a flexible sigmoidoscopy or every 10 years for a colonoscopy beginning at age 50 and continuing until age 75.  Hepatitis C blood test.** / For all people born from 1945 through 1965 and any   individual with known risks for hepatitis C.  Skin self-exam. / Monthly.  Influenza immunization.** / Every year.  Pneumococcal polysaccharide immunization.** / 1 to 2 doses if you smoke cigarettes or if you have certain chronic medical conditions.  Tetanus, diphtheria, pertussis (Tdap/Td) immunization.** / A one-time dose of Tdap vaccine. After that, you need a Td booster dose every 10 years.  Measles, mumps, rubella (MMR) immunization. / You need at least 1 dose of MMR if you were born in 1957 or later. You may also need a 2nd dose.  Varicella immunization.**/ Consult your caregiver.  Meningococcal immunization.** / Consult your caregiver.  Hepatitis A immunization.** / Consult your caregiver. 2 doses, 6 to 18 months apart.  Hepatitis B immunization.** / Consult your caregiver. 3 doses, usually over 6 months. Ages 65 and over  Blood pressure check.** / Every 1 to 2 years.  Lipid and cholesterol check.**/ Every 5 years beginning at age 20.  Fecal occult blood test (FOBT) of stool. / Every year beginning at age 50 and continuing until age 75. You may not have to do this test if you get colonoscopy every 10 years.  Flexible sigmoidoscopy** or colonoscopy.** / Every 5 years for a flexible sigmoidoscopy or every 10 years for a colonoscopy beginning at age 50 and continuing until age 75.  Hepatitis C blood  test.** / For all people born from 1945 through 1965 and any individual with known risks for hepatitis C.  Abdominal aortic aneurysm (AAA) screening.** / A one-time screening for ages 65 to 75 years who are current or former smokers.  Skin self-exam. / Monthly.  Influenza immunization.** / Every year.  Pneumococcal polysaccharide immunization.** / 1 dose at age 65 (or older) if you have never been vaccinated.  Tetanus, diphtheria, pertussis (Tdap, Td) immunization. / A one-time dose of Tdap vaccine if you are over 65 and have contact with an infant, are a healthcare worker, or simply want to be protected from whooping cough. After that, you need a Td booster dose every 10 years.  Varicella immunization. ** / Consult your caregiver.  Meningococcal immunization.** / Consult your caregiver.  Hepatitis A immunization. ** / Consult your caregiver. 2 doses, 6 to 18 months apart.  Hepatitis B immunization.** / Check with your caregiver. 3 doses, usually over 6 months. **Family history and personal history of risk and conditions may change your caregiver's recommendations. Document Released: 07/02/2001 Document Revised: 07/29/2011 Document Reviewed: 10/01/2010 [employer] Patient Information 2013 [employer].  

## 2012-07-16 ENCOUNTER — Telehealth: Payer: Self-pay

## 2012-07-16 ENCOUNTER — Ambulatory Visit: Payer: Federal, State, Local not specified - PPO | Admitting: Gastroenterology

## 2012-07-16 NOTE — Telephone Encounter (Signed)
Message copied by Chrystie Nose on Thu Jul 16, 2012  8:46 AM ------      Message from: Melvia Heaps D      Created: Mon Jul 13, 2012  3:32 PM       Bonita Quin,      This patient has had a recent drop in hemoglobin. Please provide him an office appointment within the next week or so. ------

## 2012-07-16 NOTE — Telephone Encounter (Signed)
Pt scheduled to see Amy Esterwood PA 07/22/12@8 :30am. Pt aware of appt date and time.

## 2012-07-21 ENCOUNTER — Encounter: Payer: Self-pay | Admitting: *Deleted

## 2012-07-22 ENCOUNTER — Telehealth: Payer: Self-pay

## 2012-07-22 ENCOUNTER — Other Ambulatory Visit (INDEPENDENT_AMBULATORY_CARE_PROVIDER_SITE_OTHER): Payer: Federal, State, Local not specified - PPO

## 2012-07-22 ENCOUNTER — Ambulatory Visit (INDEPENDENT_AMBULATORY_CARE_PROVIDER_SITE_OTHER): Payer: Federal, State, Local not specified - PPO | Admitting: Physician Assistant

## 2012-07-22 ENCOUNTER — Encounter: Payer: Self-pay | Admitting: Gastroenterology

## 2012-07-22 ENCOUNTER — Other Ambulatory Visit: Payer: Self-pay | Admitting: *Deleted

## 2012-07-22 ENCOUNTER — Encounter: Payer: Self-pay | Admitting: Physician Assistant

## 2012-07-22 VITALS — BP 106/60 | HR 84 | Ht 69.25 in | Wt 238.2 lb

## 2012-07-22 DIAGNOSIS — D131 Benign neoplasm of stomach: Secondary | ICD-10-CM

## 2012-07-22 DIAGNOSIS — Z860100 Personal history of colon polyps, unspecified: Secondary | ICD-10-CM

## 2012-07-22 DIAGNOSIS — K625 Hemorrhage of anus and rectum: Secondary | ICD-10-CM

## 2012-07-22 DIAGNOSIS — D649 Anemia, unspecified: Secondary | ICD-10-CM

## 2012-07-22 DIAGNOSIS — D508 Other iron deficiency anemias: Secondary | ICD-10-CM

## 2012-07-22 DIAGNOSIS — K317 Polyp of stomach and duodenum: Secondary | ICD-10-CM | POA: Insufficient documentation

## 2012-07-22 LAB — IBC PANEL
Iron: 30 ug/dL — ABNORMAL LOW (ref 42–165)
Transferrin: 314.3 mg/dL (ref 212.0–360.0)

## 2012-07-22 LAB — CBC WITH DIFFERENTIAL/PLATELET
Basophils Absolute: 0 10*3/uL (ref 0.0–0.1)
HCT: 35.2 % — ABNORMAL LOW (ref 39.0–52.0)
Lymphs Abs: 1 10*3/uL (ref 0.7–4.0)
Monocytes Absolute: 0.6 10*3/uL (ref 0.1–1.0)
Monocytes Relative: 13.8 % — ABNORMAL HIGH (ref 3.0–12.0)
Neutrophils Relative %: 60.5 % (ref 43.0–77.0)
Platelets: 269 10*3/uL (ref 150.0–400.0)
RDW: 14.6 % (ref 11.5–14.6)

## 2012-07-22 LAB — FERRITIN: Ferritin: 8.3 ng/mL — ABNORMAL LOW (ref 22.0–322.0)

## 2012-07-22 MED ORDER — INTEGRA PLUS PO CAPS: ORAL_CAPSULE | ORAL | Status: DC

## 2012-07-22 MED ORDER — NA SULFATE-K SULFATE-MG SULF 17.5-3.13-1.6 GM/177ML PO SOLN: 1.0000 | Freq: Once | ORAL | Status: AC

## 2012-07-22 NOTE — Patient Instructions (Addendum)
Please go to the basement level to have your labs drawn.  You have been scheduled for an endoscopy and colonoscopy with propofol. Please follow the written instructions given to you at your visit today. We have given you a sample for the prep you will be drinking. If you use inhalers (even only as needed), please bring them with you on the day of your procedure.

## 2012-07-22 NOTE — Telephone Encounter (Signed)
Call from patient and he wanted to know if it was ok to take the Integra, I made the patient aware that was fine and it would help elevate his iron levels and per GI to follow up in 3 mos with GI to recheck his levels. He voiced understanding, wanted to know what would have had his iron drop, no diet changes, no new med's and I made him aware the I-fob will need to be complete to be sure he isn't bleeding anywhere, he voiced understanding and further studies will be conducted as needed. He will follow up prn and for labs and call with any issues.    KP

## 2012-07-22 NOTE — Progress Notes (Signed)
Subjective:    Patient ID: Juan Velazquez, male    DOB: [birth year], 47 y.o.   MRN: [id number]  HPI Juan Velazquez is a pleasant 47 year old African American male known to Dr. Arlyce Dice from previous procedures. He had undergone colonoscopy in April of 2011 for complaints of rectal bleeding and was found to have a diminutive polyp in the sigmoid colon 3 mm in size. This was removed and was a hyperplastic polyp. He also had upper endoscopy done in March of 2012 after being seen for reflux and atypical chest pain. He was found to have a 3 cm sessile polyp in the body of the stomach. Biopsies were taken,polyp not removed-biopsy consistent with a hyperplastic polyp. He was seen by Dr. Laury Axon  for a physical recently and is referred because of complaints of recurrent intermittent rectal bleeding. He says he had surgery on his Achilles tendon last summer and was taking Motrin on a fairly regular basis after that time and started noticing dark blood which she says is very dark and tarry mixed in with his bowel movements. He had seen this fairly consistently though not everyday alter the fall and into the beginning of this year. He says occasionally he will see a small amount of brighter blood. He had no complaints of abdominal pain constipation change in bowel habits nausea vomiting anorexia etc. and has otherwise felt fine. He has no complaints of heartburn or indigestion. He was started on Prilosec and says he has not seen any blood over the past couple of weeks. He had labs done in January of 2013 showed hemoglobin of 14.1 hematocrit 42.2 and then labs done in February of 2014 hemoglobin 10.7 hematocrit of 33.2 MCV of 80.3 Stool was Hemoccult negative when checked by Dr. Laury Axon on February 12.  He is no longer taking any regular aspirin or NSAIDs. Family history is positive for colon cancer in a maternal aunt.    Review of Systems  Constitutional: Negative.   HENT: Negative.   Eyes: Negative.   Respiratory:  Negative.   Cardiovascular: Negative.   Gastrointestinal: Positive for blood in stool.  Endocrine: Negative.   Genitourinary: Negative.   Allergic/Immunologic: Negative.   Neurological: Negative.   Hematological: Negative.   Psychiatric/Behavioral: Negative.    Outpatient Prescriptions Prior to Visit  Medication Sig Dispense Refill  . albuterol (PROVENTIL HFA) 108 (90 BASE) MCG/ACT inhaler Inhale 2 puffs into the lungs every 6 (six) hours as needed.  1 Inhaler  2  . atorvastatin (LIPITOR) 10 MG tablet 1 tab by mouth at bedtime--  30 tablet  2  . fluticasone (FLONASE) 50 MCG/ACT nasal spray Place 2 sprays into the nose daily.  16 g  12  . levocetirizine (XYZAL) 5 MG tablet TAKE 1 TABLET IN THE EVENING  30 tablet  3   No facility-administered medications prior to visit.   No Known Allergies Patient Active Problem List  Diagnosis  . TINEA CORPORIS  . HYPERLIPIDEMIA  . BLURRED VISION  . EXTERNAL HEMORRHOIDS  . ALLERGIC RHINITIS, SEASONAL  . ALLERGIC RHINITIS  . RECTAL BLEEDING  . BLOOD IN STOOL  . SHOULDER PAIN, LEFT  . FOOT PAIN, BILATERAL  . FATIGUE  . NUMBNESS, HAND  . CHEST PAIN  . CPK, ABNORMAL  . LUMBAR SPRAIN AND STRAIN  . ASTHMA, PERSISTENT, MILD  . BACK PAIN  . GERD  . CHEST PAIN, ATYPICAL  . Bronchitis  . Achilles rupture, right  . Tarry stool  . Personal history of colonic polyps  .  Gastric polyp   History  Substance Use Topics  . Smoking status: Never Smoker   . Smokeless tobacco: Never Used  . Alcohol Use: Yes     Comment: rare   family history includes Colon cancer in his maternal aunt; Diabetes in his mother and sister; Diabetes type II in his brother; Heart disease in his father; and Hypertension in his brother and mother.      Objective:   Physical Exam  well-developed healthy-appearing African American male in no acute distress, pleasant blood pressure 106/60 pulse 84 height 5 foot 9 weight 238. HEENT; nontraumatic normocephalic EOMI PERRLA  sclera anicteric,Neck; Supple no JVD, Cardiovascular; regular rate and rhythm with S1-S2 no murmur or gallop, Pulmonary; clear bilaterally, Abdomen; soft, nontender nondistended bowel sounds are active there is no palpable mass or hepatosplenomegaly, Rectal; exam not repeated this was done per Dr. Laury Axon  a few weeks ago and he was Hemoccult-negative at that time. Extremities; no clubbing cyanosis or edema skin warm and dry, Psych; mood and affect normal and appropriate.        Assessment & Plan:  #60 47 year old male with intermittent dark and occasionally Bright blood in stool over the past several months, and new finding of a normocytic anemia. Etiology of his bleeding is not clear-he did have a large hyperplastic gastric polyp noted on endoscopy March 2012 this is a potential source for bleeding. Family history is also positive for colon cancer and though he had colonoscopy in April of 2011 will need to be reevaluated. #2 asthma #3 allergic rhinitis #4 hyperlipidemia  Plan; continue Prilosec 20 mg by mouth every morning Repeat CBC today and also check iron studies Schedule for upper endoscopy and colonoscopy with Dr. Arlyce Dice. Procedures were discussed in detail with the patient and he is agreeable to proceed.

## 2012-07-23 NOTE — Progress Notes (Signed)
Reviewed and agree with management.  Am suspicious that he may be bleeding from his gastric polyp. Let's set him up for endoscopy at the hospital so that I can band the polyp if that is deemed necessary Barbette Hair. Arlyce Dice, M.D., [facility]

## 2012-07-29 ENCOUNTER — Ambulatory Visit: Payer: Federal, State, Local not specified - PPO | Admitting: Gastroenterology

## 2012-09-01 ENCOUNTER — Other Ambulatory Visit (INDEPENDENT_AMBULATORY_CARE_PROVIDER_SITE_OTHER): Payer: Federal, State, Local not specified - PPO

## 2012-09-01 ENCOUNTER — Other Ambulatory Visit: Payer: Self-pay | Admitting: *Deleted

## 2012-09-01 ENCOUNTER — Encounter: Payer: Self-pay | Admitting: Gastroenterology

## 2012-09-01 ENCOUNTER — Ambulatory Visit (AMBULATORY_SURGERY_CENTER): Payer: Federal, State, Local not specified - PPO | Admitting: Gastroenterology

## 2012-09-01 VITALS — BP 119/64 | HR 59 | Temp 96.9°F | Resp 15 | Ht 69.25 in | Wt 238.0 lb

## 2012-09-01 DIAGNOSIS — K5730 Diverticulosis of large intestine without perforation or abscess without bleeding: Secondary | ICD-10-CM

## 2012-09-01 DIAGNOSIS — D649 Anemia, unspecified: Secondary | ICD-10-CM

## 2012-09-01 DIAGNOSIS — K625 Hemorrhage of anus and rectum: Secondary | ICD-10-CM

## 2012-09-01 DIAGNOSIS — D508 Other iron deficiency anemias: Secondary | ICD-10-CM

## 2012-09-01 MED ORDER — SODIUM CHLORIDE 0.9 % IV SOLN: 500.0000 mL | INTRAVENOUS | Status: DC

## 2012-09-01 NOTE — Patient Instructions (Addendum)
Discharge instructions given with verbal understanding. Handout on diverticulosis and a high fiber diet given. hemmocult cards given in recovery. Resume previous medications. YOU HAD AN ENDOSCOPIC PROCEDURE TODAY AT THE [facility]: Refer to the procedure report that was given to you for any specific questions about what was found during the examination.  If the procedure report does not answer your questions, please call your gastroenterologist to clarify.  If you requested that your care partner not be given the details of your procedure findings, then the procedure report has been included in a sealed envelope for you to review at your convenience later.  YOU SHOULD EXPECT: Some feelings of bloating in the abdomen. Passage of more gas than usual.  Walking can help get rid of the air that was put into your GI tract during the procedure and reduce the bloating. If you had a lower endoscopy (such as a colonoscopy or flexible sigmoidoscopy) you may notice spotting of blood in your stool or on the toilet paper. If you underwent a bowel prep for your procedure, then you may not have a normal bowel movement for a few days.  DIET: Your first meal following the procedure should be a light meal and then it is ok to progress to your normal diet.  A half-sandwich or bowl of soup is an example of a good first meal.  Heavy or fried foods are harder to digest and may make you feel nauseous or bloated.  Likewise meals heavy in dairy and vegetables can cause extra gas to form and this can also increase the bloating.  Drink plenty of fluids but you should avoid alcoholic beverages for 24 hours.  ACTIVITY: Your care partner should take you home directly after the procedure.  You should plan to take it easy, moving slowly for the rest of the day.  You can resume normal activity the day after the procedure however you should NOT DRIVE or use heavy machinery for 24 hours (because of the sedation medicines used  during the test).    SYMPTOMS TO REPORT IMMEDIATELY: A gastroenterologist can be reached at any hour.  During normal business hours, 8:30 AM to 5:00 PM Monday through Friday, call [phone].  After hours and on weekends, please call the GI answering service at [phone] who will take a message and have the physician on call contact you.   Following lower endoscopy (colonoscopy or flexible sigmoidoscopy):  Excessive amounts of blood in the stool  Significant tenderness or worsening of abdominal pains  Swelling of the abdomen that is new, acute  Fever of 100F or higher  Following upper endoscopy (EGD)  Vomiting of blood or coffee ground material  New chest pain or pain under the shoulder blades  Painful or persistently difficult swallowing  New shortness of breath  Fever of 100F or higher  Black, tarry-looking stools  FOLLOW UP: If any biopsies were taken you will be contacted by phone or by letter within the next 1-3 weeks.  Call your gastroenterologist if you have not heard about the biopsies in 3 weeks.  Our staff will call the home number listed on your records the next business day following your procedure to check on you and address any questions or concerns that you may have at that time regarding the information given to you following your procedure. This is a courtesy call and so if there is no answer at the home number and we have not heard from you through the emergency  physician on call, we will assume that you have returned to your regular daily activities without incident.  SIGNATURES/CONFIDENTIALITY: You and/or your care partner have signed paperwork which will be entered into your electronic medical record.  These signatures attest to the fact that that the information above on your After Visit Summary has been reviewed and is understood.  Full responsibility of the confidentiality of this discharge information lies with you and/or your care-partner.

## 2012-09-01 NOTE — Op Note (Signed)
 [facility] [address] [place], [address]   ENDOSCOPY PROCEDURE REPORT  PATIENT: Juan Velazquez, Juan Velazquez  MR#: [id number] BIRTHDATE: 07-Nov-1965 , 47  yrs. old GENDER: Male ENDOSCOPIST: Louis Meckel, MD REFERRED BY: PROCEDURE DATE:  09/01/2012 PROCEDURE:  EGD, diagnostic ASA CLASS:     Class II INDICATIONS:  Melena. MEDICATIONS: There was residual sedation effect present from prior procedure, MAC sedation, administered by CRNA, propofol (Diprivan) 200mg  IV, and Simethicone 0.6cc PO TOPICAL ANESTHETIC: Cetacaine Spray  DESCRIPTION OF PROCEDURE: After the risks benefits and alternatives of the procedure were thoroughly explained, informed consent was obtained.  The LB GIF-H180 [id number] endoscope was introduced through the mouth and advanced to the third portion of the duodenum. Without limitations.  The instrument was slowly withdrawn as the mucosa was fully examined.      The upper, middle and distal third of the esophagus were carefully inspected and no abnormalities were noted.  The z-line was well seen at the GEJ.  The endoscope was pushed into the fundus which was normal including a retroflexed view.  The antrum, gastric body, first and second part of the duodenum were unremarkable. Retroflexed views revealed no abnormalities.     The scope was then withdrawn from the patient and the procedure completed.  COMPLICATIONS: There were no complications. ENDOSCOPIC IMPRESSION: Normal EGD  RECOMMENDATIONS: 1.  Capsule endoscopy 2.  Hemmoccult stools  REPEAT EXAM:  eSigned:  Louis Meckel, MD 09/01/2012 3:40 PM   LK:GMWNUU R Lowne, DO

## 2012-09-01 NOTE — Op Note (Signed)
[facility] [address] [place], [address]   COLONOSCOPY PROCEDURE REPORT  PATIENT: Juan Velazquez, Juan Velazquez  MR#: [id number] BIRTHDATE: 1965/06/14 , 47  yrs. old GENDER: Male ENDOSCOPIST: Louis Meckel, MD REFERRED BY: PROCEDURE DATE:  09/01/2012 PROCEDURE:   Colonoscopy, diagnostic ASA CLASS:   Class II INDICATIONS:melena. MEDICATIONS: MAC sedation, administered by CRNA and propofol (Diprivan) 300mg  IV  DESCRIPTION OF PROCEDURE:   After the risks benefits and alternatives of the procedure were thoroughly explained, informed consent was obtained.  A digital rectal exam revealed no abnormalities of the rectum.   The LB [id number]  endoscope was introduced through the anus and advanced to the cecum, which was identified by both the appendix and ileocecal valve. No adverse events experienced.   The quality of the prep was Suprep excellent The instrument was then slowly withdrawn as the colon was fully examined.      COLON FINDINGS: Mild diverticulosis was noted in the sigmoid colon. The colon mucosa was otherwise normal.  Retroflexed views revealed no abnormalities. The time to cecum=7 minutes 43 seconds. Withdrawal time=6 minutes 01 seconds.  The scope was withdrawn and the procedure completed. COMPLICATIONS: There were no complications.  ENDOSCOPIC IMPRESSION: 1.   Mild diverticulosis was noted in the sigmoid colon 2.   The colon mucosa was otherwise normal  RECOMMENDATIONS: Upper endoscopy colonoscopy 10 years   eSigned:  Louis Meckel, MD 09/01/2012 3:38 PM   cc: Lelon Perla, DO

## 2012-09-01 NOTE — Progress Notes (Signed)
Patient did not experience any of the following events: a burn prior to discharge; a fall within the facility; wrong site/side/patient/procedure/implant event; or a hospital transfer or hospital admission upon discharge from the facility. (G8907) Patient did not have preoperative order for IV antibiotic SSI prophylaxis. (G8918)  

## 2012-09-02 ENCOUNTER — Telehealth: Payer: Self-pay | Admitting: *Deleted

## 2012-09-02 ENCOUNTER — Telehealth: Payer: Self-pay

## 2012-09-02 NOTE — Telephone Encounter (Signed)
Left a message for patient to call me to schedule SBCE teaching/procedure.

## 2012-09-02 NOTE — Telephone Encounter (Signed)
  Follow up Call-  Call back number 09/01/2012  Post procedure Call Back phone  # [phone]  Permission to leave phone message Yes     Patient questions:  Do you have a fever, pain , or abdominal swelling? no Pain Score  0 *  Have you tolerated food without any problems? yes  Have you been able to return to your normal activities? yes  Do you have any questions about your discharge instructions: Diet   no Medications  no Follow up visit  no  Do you have questions or concerns about your Care? no  Actions: * If pain score is 4 or above: No action needed, pain <4.

## 2012-09-02 NOTE — Telephone Encounter (Signed)
Spoke with patient and he will check his work schedule and call back to schedule.

## 2012-09-15 ENCOUNTER — Ambulatory Visit (INDEPENDENT_AMBULATORY_CARE_PROVIDER_SITE_OTHER): Payer: Federal, State, Local not specified - PPO | Admitting: Family Medicine

## 2012-09-15 ENCOUNTER — Encounter: Payer: Self-pay | Admitting: Family Medicine

## 2012-09-15 VITALS — BP 113/66 | HR 76 | Ht 69.0 in | Wt 225.0 lb

## 2012-09-15 DIAGNOSIS — M25512 Pain in left shoulder: Secondary | ICD-10-CM

## 2012-09-15 DIAGNOSIS — M25519 Pain in unspecified shoulder: Secondary | ICD-10-CM

## 2012-09-15 NOTE — Patient Instructions (Addendum)
You have rotator cuff impingement Consider avoiding painful activities (overhead activities, lifting with extended arm) as much as possible. Tylenol 500mg  - 1-2 tabs three times a day as needed for pain. If you need something stronger like tramadol let me know. Subacromial injection may be beneficial to help with pain and to decrease inflammation - consider if not improving with therapy. Nitro patches are another consideration. Start physical therapy with transition to home exercise program. Do home exercise program with theraband and scapular stabilization exercises daily - these are very important for long term relief even if an injection was given. Follow up with me in 6 weeks for reevaluation.

## 2012-09-16 ENCOUNTER — Encounter: Payer: Self-pay | Admitting: Family Medicine

## 2012-09-16 NOTE — Progress Notes (Signed)
Subjective:    Patient ID: Juan Velazquez, male    DOB: Mar 02, 1966, 47 y.o.   MRN: [id number]  PCP: Dr. Laury Axon  HPI 47 yo M here for left shoulder pain.  Patient reports for past 2-3 months has developed slowly worsening left shoulder pain. Felt deep superior within shoulder. Seems worse with bench press but doesn't limit his lifting. Not as bad with overhead press. No swelling, bruising. No injury. Seems worse with inactivity also. No neck pain. No numbness/tingling. No prior injuries to this shoulder. Sometimes feels like a sticking within shoulder.  Past Medical History  Diagnosis Date  . Hyperlipidemia   . Allergy   . Asthma   . GERD (gastroesophageal reflux disease)   . Hyperplastic colonic polyp 09/01/2009  . Gastric polyp 08/06/2010    Current Outpatient Prescriptions on File Prior to Visit  Medication Sig Dispense Refill  . albuterol (PROVENTIL HFA) 108 (90 BASE) MCG/ACT inhaler Inhale 2 puffs into the lungs every 6 (six) hours as needed.  1 Inhaler  2  . atorvastatin (LIPITOR) 10 MG tablet 1 tab by mouth at bedtime--  30 tablet  2  . FeFum-FePoly-FA-B Cmp-C-Biot (INTEGRA PLUS) CAPS Take one po daily  30 capsule  2  . fluticasone (FLONASE) 50 MCG/ACT nasal spray Place 2 sprays into the nose daily.  16 g  12  . levocetirizine (XYZAL) 5 MG tablet TAKE 1 TABLET IN THE EVENING  30 tablet  3   No current facility-administered medications on file prior to visit.    Past Surgical History  Procedure Laterality Date  . Achilles tendon repair Right 10/2011    No Known Allergies  History   Social History  . Marital Status: Married    Spouse Name: N/A    Number of Children: 0  . Years of Education: N/A   Occupational History  . dept [employer] --w/s    Social History Main Topics  . Smoking status: Never Smoker   . Smokeless tobacco: Never Used  . Alcohol Use: Yes     Comment: rare  . Drug Use: No  . Sexually Active: Yes -- Male partner(s)   Other Topics  Concern  . Not on file   Social History Narrative   Exercise-- 3x a week    Family History  Problem Relation Age of Onset  . Diabetes Mother   . Hypertension Mother   . Diabetes type II Brother   . Hypertension Brother   . Diabetes Sister   . Colon cancer Maternal Aunt   . Heart disease Father     defibrillator    BP 113/66  Pulse 76  Ht 5\' 9"  (1.753 m)  Wt 225 lb (102.059 kg)  BMI 33.21 kg/m2  Review of Systems See HPI above.    Objective:   Physical Exam Gen: NAD  L shoulder: No swelling, ecchymoses.  No gross deformity. No TTP at [facility], biceps tendon, elsewhere about shoulder. FROM without painful arc. Negative Hawkins, Neers. Negative Speeds, Yergasons. Strength 5/5 with empty can and resisted internal/external rotation.  Pain with empty can. Equivocal o'briens - pain with both but slightly more with thumb down. Negative apprehension.  Negative sulcus sign. Negative crossover adduction. NV intact distally.    Assessment & Plan:  1. Left shoulder pain - consistent with rotator cuff impingement.  No tenderness at [facility] and negative crossover test.  No prior injury, instability to suggest subluxation or labral tear even with slightly positive o'briens.  Start with physical therapy and  home exercises.  Avoid nsaids with recent GI bleed.  Tylenol, icing as needed.  Consider subacromial injection, nitro patches if not improving.  F/u in 6 weeks for reevaluation.

## 2012-09-16 NOTE — Telephone Encounter (Signed)
Left message for pt to call back  °

## 2012-09-16 NOTE — Assessment & Plan Note (Signed)
consistent with rotator cuff impingement.  No tenderness at [facility] and negative crossover test.  No prior injury, instability to suggest subluxation or labral tear even with slightly positive o'briens.  Start with physical therapy and home exercises.  Avoid nsaids with recent GI bleed.  Tylenol, icing as needed.  Consider subacromial injection, nitro patches if not improving.  F/u in 6 weeks for reevaluation.

## 2012-09-17 NOTE — Telephone Encounter (Signed)
Pt states he has had some trouble with his shoulder and will be having some physical therapy the next couple of weeks. Pt states he will call us back when he is ready to schedule the capsule endo. Dr. Arlyce Dice aware.

## 2012-09-22 ENCOUNTER — Other Ambulatory Visit: Payer: Self-pay | Admitting: Internal Medicine

## 2012-09-23 ENCOUNTER — Ambulatory Visit: Payer: Federal, State, Local not specified - PPO | Admitting: Physical Therapy

## 2012-09-30 ENCOUNTER — Ambulatory Visit: Payer: Federal, State, Local not specified - PPO | Attending: Family Medicine | Admitting: Physical Therapy

## 2012-09-30 ENCOUNTER — Other Ambulatory Visit: Payer: Self-pay | Admitting: *Deleted

## 2012-09-30 DIAGNOSIS — R293 Abnormal posture: Secondary | ICD-10-CM | POA: Insufficient documentation

## 2012-09-30 DIAGNOSIS — E785 Hyperlipidemia, unspecified: Secondary | ICD-10-CM

## 2012-09-30 DIAGNOSIS — M25519 Pain in unspecified shoulder: Secondary | ICD-10-CM | POA: Insufficient documentation

## 2012-09-30 DIAGNOSIS — M25619 Stiffness of unspecified shoulder, not elsewhere classified: Secondary | ICD-10-CM | POA: Insufficient documentation

## 2012-09-30 DIAGNOSIS — IMO0001 Reserved for inherently not codable concepts without codable children: Secondary | ICD-10-CM | POA: Insufficient documentation

## 2012-09-30 MED ORDER — ATORVASTATIN CALCIUM 10 MG PO TABS: ORAL_TABLET | ORAL | Status: DC

## 2012-09-30 NOTE — Telephone Encounter (Signed)
Rx sent 

## 2012-10-02 ENCOUNTER — Other Ambulatory Visit (INDEPENDENT_AMBULATORY_CARE_PROVIDER_SITE_OTHER): Payer: Federal, State, Local not specified - PPO

## 2012-10-02 ENCOUNTER — Other Ambulatory Visit: Payer: Self-pay | Admitting: *Deleted

## 2012-10-02 DIAGNOSIS — K921 Melena: Secondary | ICD-10-CM

## 2012-10-02 DIAGNOSIS — D649 Anemia, unspecified: Secondary | ICD-10-CM

## 2012-10-02 LAB — HEMOCCULT SLIDES (X 3 CARDS)
OCCULT 4: NEGATIVE
OCCULT 5: NEGATIVE

## 2012-10-09 ENCOUNTER — Ambulatory Visit: Payer: Federal, State, Local not specified - PPO | Admitting: Physical Therapy

## 2012-10-22 ENCOUNTER — Ambulatory Visit: Payer: Federal, State, Local not specified - PPO | Admitting: Physical Therapy

## 2012-10-25 ENCOUNTER — Other Ambulatory Visit: Payer: Self-pay | Admitting: Family Medicine

## 2012-10-25 ENCOUNTER — Other Ambulatory Visit: Payer: Self-pay | Admitting: Physician Assistant

## 2012-10-27 ENCOUNTER — Other Ambulatory Visit: Payer: Self-pay | Admitting: *Deleted

## 2012-10-27 ENCOUNTER — Ambulatory Visit: Payer: Federal, State, Local not specified - PPO | Admitting: Family Medicine

## 2012-10-27 ENCOUNTER — Telehealth: Payer: Self-pay | Admitting: *Deleted

## 2012-10-27 DIAGNOSIS — K625 Hemorrhage of anus and rectum: Secondary | ICD-10-CM

## 2012-10-27 NOTE — Telephone Encounter (Signed)
I called the patient to advise him per Amy Esterwood PA-C we will hold off on refilling the iron supplement. He needs to come to our lab for CBC Diff , and Iron panel and Ferritin.  He said he could come next week on the 17th of June, 2014.  I told him to be sure and come to the lab next week.

## 2012-11-01 ENCOUNTER — Other Ambulatory Visit: Payer: Self-pay | Admitting: Family Medicine

## 2012-11-02 ENCOUNTER — Telehealth: Payer: Self-pay | Admitting: Family Medicine

## 2012-11-02 NOTE — Telephone Encounter (Signed)
Patient is calling to be advised on what immunizations he may need for his trip to [place] he has coming up.

## 2012-11-02 NOTE — Telephone Encounter (Signed)
Pt referred to travel clinic at [phone]

## 2012-11-03 ENCOUNTER — Ambulatory Visit: Payer: Federal, State, Local not specified - PPO | Attending: Family Medicine | Admitting: Physical Therapy

## 2012-11-03 ENCOUNTER — Other Ambulatory Visit (INDEPENDENT_AMBULATORY_CARE_PROVIDER_SITE_OTHER): Payer: Federal, State, Local not specified - PPO

## 2012-11-03 ENCOUNTER — Encounter: Payer: Self-pay | Admitting: Family Medicine

## 2012-11-03 ENCOUNTER — Ambulatory Visit (INDEPENDENT_AMBULATORY_CARE_PROVIDER_SITE_OTHER): Payer: Federal, State, Local not specified - PPO | Admitting: Family Medicine

## 2012-11-03 ENCOUNTER — Telehealth: Payer: Self-pay | Admitting: Family Medicine

## 2012-11-03 ENCOUNTER — Other Ambulatory Visit: Payer: Self-pay | Admitting: Family Medicine

## 2012-11-03 VITALS — BP 115/69 | HR 69 | Ht 69.0 in | Wt 230.0 lb

## 2012-11-03 DIAGNOSIS — M25519 Pain in unspecified shoulder: Secondary | ICD-10-CM | POA: Insufficient documentation

## 2012-11-03 DIAGNOSIS — D649 Anemia, unspecified: Secondary | ICD-10-CM

## 2012-11-03 DIAGNOSIS — M25512 Pain in left shoulder: Secondary | ICD-10-CM

## 2012-11-03 DIAGNOSIS — R293 Abnormal posture: Secondary | ICD-10-CM | POA: Insufficient documentation

## 2012-11-03 DIAGNOSIS — IMO0001 Reserved for inherently not codable concepts without codable children: Secondary | ICD-10-CM | POA: Insufficient documentation

## 2012-11-03 DIAGNOSIS — M25619 Stiffness of unspecified shoulder, not elsewhere classified: Secondary | ICD-10-CM | POA: Insufficient documentation

## 2012-11-03 DIAGNOSIS — K625 Hemorrhage of anus and rectum: Secondary | ICD-10-CM

## 2012-11-03 DIAGNOSIS — E785 Hyperlipidemia, unspecified: Secondary | ICD-10-CM

## 2012-11-03 LAB — CBC WITH DIFFERENTIAL/PLATELET
Basophils Absolute: 0 10*3/uL (ref 0.0–0.1)
Eosinophils Relative: 4.1 % (ref 0.0–5.0)
HCT: 41.8 % (ref 39.0–52.0)
Hemoglobin: 13.7 g/dL (ref 13.0–17.0)
Lymphocytes Relative: 35.6 % (ref 12.0–46.0)
Lymphs Abs: 1.3 10*3/uL (ref 0.7–4.0)
Monocytes Relative: 11.9 % (ref 3.0–12.0)
Neutro Abs: 1.7 10*3/uL (ref 1.4–7.7)
WBC: 3.5 10*3/uL — ABNORMAL LOW (ref 4.5–10.5)

## 2012-11-03 LAB — IBC PANEL
Saturation Ratios: 22.7 % (ref 20.0–50.0)
Transferrin: 255.2 mg/dL (ref 212.0–360.0)

## 2012-11-03 LAB — FERRITIN: Ferritin: 11.8 ng/mL — ABNORMAL LOW (ref 22.0–322.0)

## 2012-11-03 LAB — LIPID PANEL
Cholesterol: 144 mg/dL (ref 0–200)
HDL: 55.5 mg/dL (ref >39.00)
LDL Cholesterol: 75 mg/dL (ref 0–99)
Total CHOL/HDL Ratio: 3
Triglycerides: 70 mg/dL (ref 0.0–149.0)

## 2012-11-03 LAB — HEPATIC FUNCTION PANEL
Bilirubin, Direct: 0.1 mg/dL (ref 0.0–0.3)
Total Bilirubin: 0.8 mg/dL (ref 0.3–1.2)

## 2012-11-03 MED ORDER — ATOVAQUONE-PROGUANIL HCL 250-100 MG PO TABS: ORAL_TABLET | ORAL | Status: DC

## 2012-11-03 NOTE — Telephone Encounter (Signed)
Please advise      KP 

## 2012-11-03 NOTE — Telephone Encounter (Signed)
Patient was told by the travel clinic that for his upcoming trip to [place] he would need a prescription to prevent him from getting Malaria. Patient states that he was told to contact our office for this prescription. [facility] [address]

## 2012-11-03 NOTE — Telephone Encounter (Signed)
There is only one dose--- 1 a day starting 2 days before he leaves and con't until 1 week after

## 2012-11-03 NOTE — Telephone Encounter (Signed)
We need to know how many days he is going.  He starts the med 2 days before and finishes 1 week after 1 po qd

## 2012-11-03 NOTE — Telephone Encounter (Signed)
Pt states that he need Rx for Malarone for prophylaxis treatment.

## 2012-11-03 NOTE — Telephone Encounter (Signed)
There are mult that could be written did they tell him which med?

## 2012-11-03 NOTE — Telephone Encounter (Signed)
Pt states that he will be leave on 11-10-12 and return on 11-16-12. Please verify dose of malarone

## 2012-11-03 NOTE — Telephone Encounter (Signed)
Spoke with Pt who indicated that all he know is he needs Rx for malaria. Pt will call them back and then let us know which one he needs.

## 2012-11-03 NOTE — Telephone Encounter (Signed)
please advise     KP 

## 2012-11-04 ENCOUNTER — Encounter: Payer: Self-pay | Admitting: Family Medicine

## 2012-11-04 ENCOUNTER — Other Ambulatory Visit: Payer: Self-pay | Admitting: Gastroenterology

## 2012-11-04 ENCOUNTER — Other Ambulatory Visit: Payer: Self-pay | Admitting: *Deleted

## 2012-11-04 DIAGNOSIS — D649 Anemia, unspecified: Secondary | ICD-10-CM

## 2012-11-04 MED ORDER — ATOVAQUONE-PROGUANIL HCL 250-100 MG PO TABS: ORAL_TABLET | ORAL | Status: DC

## 2012-11-04 MED ORDER — INTEGRA PLUS PO CAPS: ORAL_CAPSULE | ORAL | Status: DC

## 2012-11-04 NOTE — Telephone Encounter (Signed)
Rx faxed and the patient has been made aware.     KP 

## 2012-11-04 NOTE — Progress Notes (Signed)
Subjective:    Patient ID: Juan Velazquez, male    DOB: Feb 27, 1966, 47 y.o.   MRN: [id number]  PCP: Dr. Laury Axon  HPI  47 yo M here for f/u left shoulder pain.  4/29: Patient reports for past 2-3 months has developed slowly worsening left shoulder pain. Felt deep superior within shoulder. Seems worse with bench press but doesn't limit his lifting. Not as bad with overhead press. No swelling, bruising. No injury. Seems worse with inactivity also. No neck pain. No numbness/tingling. No prior injuries to this shoulder. Sometimes feels like a sticking within shoulder.  6/17: Patient reports he's been doing PT and home exercise program about every other day. Feels a little bit worse than last visit. Has been icing shoulder. Worse to put on and off a shirt, with overhead lifting. Some night pain as well - takes tylenol to help with this.  Past Medical History  Diagnosis Date  . Hyperlipidemia   . Allergy   . Asthma   . GERD (gastroesophageal reflux disease)   . Hyperplastic colonic polyp 09/01/2009  . Gastric polyp 08/06/2010    Current Outpatient Prescriptions on File Prior to Visit  Medication Sig Dispense Refill  . albuterol (PROVENTIL HFA) 108 (90 BASE) MCG/ACT inhaler Inhale 2 puffs into the lungs every 6 (six) hours as needed.  1 Inhaler  2  . atorvastatin (LIPITOR) 10 MG tablet 1 tab by mouth at bedtime--Labs due  30 tablet  0  . FeFum-FePoly-FA-B Cmp-C-Biot (INTEGRA PLUS) CAPS Take one po daily  30 capsule  2  . fluticasone (FLONASE) 50 MCG/ACT nasal spray Place 2 sprays into the nose daily.  16 g  12  . levocetirizine (XYZAL) 5 MG tablet TAKE 1 TABLET BY MOUTH IN THE EVENING  30 tablet  11  . levocetirizine (XYZAL) 5 MG tablet TAKE 1 TABLET BY MOUTH IN THE EVENING  30 tablet  11   No current facility-administered medications on file prior to visit.    Past Surgical History  Procedure Laterality Date  . Achilles tendon repair Right 10/2011    No Known  Allergies  History   Social History  . Marital Status: Married    Spouse Name: N/A    Number of Children: 0  . Years of Education: N/A   Occupational History  . dept [employer] --w/s    Social History Main Topics  . Smoking status: Never Smoker   . Smokeless tobacco: Never Used  . Alcohol Use: Yes     Comment: rare  . Drug Use: No  . Sexually Active: Yes -- Male partner(s)   Other Topics Concern  . Not on file   Social History Narrative   Exercise-- 3x a week    Family History  Problem Relation Age of Onset  . Diabetes Mother   . Hypertension Mother   . Diabetes type II Brother   . Hypertension Brother   . Diabetes Sister   . Colon cancer Maternal Aunt   . Heart disease Father     defibrillator    BP 115/69  Pulse 69  Ht 5\' 9"  (1.753 m)  Wt 230 lb (104.327 kg)  BMI 33.95 kg/m2  Review of Systems  See HPI above.    Objective:   Physical Exam  Gen: NAD  L shoulder: No swelling, ecchymoses.  No gross deformity. No TTP at [facility], biceps tendon, elsewhere about shoulder. FROM with painful arc. Positive Hawkins, negative Neers. Negative Speeds, Yergasons. Strength 5/5 with empty can and  resisted internal/external rotation.  Pain with empty can. Negative apprehension. NV intact distally.    Assessment & Plan:  1. Left shoulder pain - consistent with rotator cuff impingement.  Not improving as would expect with PT and home exercises - encouraged to do exercises every day.  Continue icing, tylenol.  Discussed options (current treatment +/- MRI, injection, nitro patches).  Given subacromial injection today - will wait about 5 days then restart exercises and PT.  F/u in 6 weeks for reevaluation.  After informed written consent, patient was seated on exam table. Left shoulder was prepped with alcohol swab and utilizing posterior approach, patient's left subacromial space was injected with 3:1 marcaine: depomedrol. Patient tolerated the procedure well without immediate  complications.

## 2012-11-04 NOTE — Patient Instructions (Addendum)
Injection given today. Instructed to wait about 5 days then restart PT and home exercise program.

## 2012-11-04 NOTE — Assessment & Plan Note (Signed)
consistent with rotator cuff impingement.  Not improving as would expect with PT and home exercises - encouraged to do exercises every day.  Continue icing, tylenol.  Discussed options (current treatment +/- MRI, injection, nitro patches).  Given subacromial injection today - will wait about 5 days then restart exercises and PT.  F/u in 6 weeks for reevaluation.  After informed written consent, patient was seated on exam table. Left shoulder was prepped with alcohol swab and utilizing posterior approach, patient's left subacromial space was injected with 3:1 marcaine: depomedrol. Patient tolerated the procedure well without immediate complications.

## 2012-11-17 ENCOUNTER — Ambulatory Visit: Payer: Federal, State, Local not specified - PPO | Attending: Family Medicine | Admitting: Physical Therapy

## 2012-11-17 DIAGNOSIS — IMO0001 Reserved for inherently not codable concepts without codable children: Secondary | ICD-10-CM | POA: Insufficient documentation

## 2012-11-17 DIAGNOSIS — M25619 Stiffness of unspecified shoulder, not elsewhere classified: Secondary | ICD-10-CM | POA: Insufficient documentation

## 2012-11-17 DIAGNOSIS — R293 Abnormal posture: Secondary | ICD-10-CM | POA: Insufficient documentation

## 2012-11-17 DIAGNOSIS — M25519 Pain in unspecified shoulder: Secondary | ICD-10-CM | POA: Insufficient documentation

## 2013-01-25 ENCOUNTER — Encounter: Payer: Self-pay | Admitting: Family Medicine

## 2013-01-25 ENCOUNTER — Other Ambulatory Visit (INDEPENDENT_AMBULATORY_CARE_PROVIDER_SITE_OTHER): Payer: Federal, State, Local not specified - PPO

## 2013-01-25 ENCOUNTER — Ambulatory Visit (INDEPENDENT_AMBULATORY_CARE_PROVIDER_SITE_OTHER): Payer: Federal, State, Local not specified - PPO | Admitting: Family Medicine

## 2013-01-25 VITALS — BP 111/69 | HR 51 | Ht 69.0 in | Wt 225.0 lb

## 2013-01-25 DIAGNOSIS — M25519 Pain in unspecified shoulder: Secondary | ICD-10-CM

## 2013-01-25 DIAGNOSIS — M25512 Pain in left shoulder: Secondary | ICD-10-CM

## 2013-01-25 DIAGNOSIS — D649 Anemia, unspecified: Secondary | ICD-10-CM

## 2013-01-25 LAB — CBC WITH DIFFERENTIAL/PLATELET
Basophils Absolute: 0 10*3/uL (ref 0.0–0.1)
Eosinophils Relative: 5.3 % — ABNORMAL HIGH (ref 0.0–5.0)
HCT: 42.4 % (ref 39.0–52.0)
Hemoglobin: 14.3 g/dL (ref 13.0–17.0)
Lymphocytes Relative: 33 % (ref 12.0–46.0)
Lymphs Abs: 1.3 10*3/uL (ref 0.7–4.0)
Monocytes Relative: 13.7 % — ABNORMAL HIGH (ref 3.0–12.0)
Platelets: 228 10*3/uL (ref 150.0–400.0)
RDW: 15.7 % — ABNORMAL HIGH (ref 11.5–14.6)
WBC: 3.8 10*3/uL — ABNORMAL LOW (ref 4.5–10.5)

## 2013-01-25 MED ORDER — NITROGLYCERIN 0.2 MG/HR TD PT24: MEDICATED_PATCH | TRANSDERMAL | Status: DC

## 2013-01-25 NOTE — Patient Instructions (Addendum)
Continue home exercises. Add nitroglycerin patches - 1/4th of a patch over affected shoulder, change daily - put in a little different area each day. Follow up with me in 6 weeks for reevaluation.

## 2013-01-26 ENCOUNTER — Encounter: Payer: Self-pay | Admitting: Family Medicine

## 2013-01-26 NOTE — Assessment & Plan Note (Signed)
consistent with rotator cuff impingement.  Discussed options at this point.  He wants to try nitro patches - discussed risks of headaches, local irritation.  Consider repeating injection, going ahead with MRI if not improving.  F/u in 6 weeks for reevaluation.

## 2013-01-26 NOTE — Progress Notes (Signed)
Patient ID: Juan Velazquez, male   DOB: July 24, 1965, 47 y.o.   MRN: [id number]  Subjective:    Patient ID: Juan Velazquez, male    DOB: 16-Aug-1965, 47 y.o.   MRN: [id number]  PCP: Dr. Laury Axon  HPI 47 yo M here for f/u left shoulder pain.  4/29: Patient reports for past 2-3 months has developed slowly worsening left shoulder pain. Felt deep superior within shoulder. Seems worse with bench press but doesn't limit his lifting. Not as bad with overhead press. No swelling, bruising. No injury. Seems worse with inactivity also. No neck pain. No numbness/tingling. No prior injuries to this shoulder. Sometimes feels like a sticking within shoulder.  6/17: Patient reports he's been doing PT and home exercise program about every other day. Feels a little bit worse than last visit. Has been icing shoulder. Worse to put on and off a shirt, with overhead lifting. Some night pain as well - takes tylenol to help with this.  9/8: Patient returns stating injection helped for about a month. Pain back to where it was previously - no real pain during day but bothers him with certain motions like reaching behind back. Ok with working out now. Really bad when sleeping - hard to get into a comfortable position. Done with PT and still doing home exercises. Icing and taking tylenol.  Past Medical History  Diagnosis Date  . Hyperlipidemia   . Allergy   . Asthma   . GERD (gastroesophageal reflux disease)   . Hyperplastic colonic polyp 09/01/2009  . Gastric polyp 08/06/2010    Current Outpatient Prescriptions on File Prior to Visit  Medication Sig Dispense Refill  . albuterol (PROVENTIL HFA) 108 (90 BASE) MCG/ACT inhaler Inhale 2 puffs into the lungs every 6 (six) hours as needed.  1 Inhaler  2  . atorvastatin (LIPITOR) 10 MG tablet 1 tab by mouth at bedtime--Labs due  30 tablet  0  . atovaquone-proguanil (MALARONE) 250-100 MG TABS 1 po qd .  Start 2 days before trip and con't until 1  week after you get back  16 tablet  0  . atovaquone-proguanil (MALARONE) 250-100 MG TABS Take one tablet by mouth 2 days prior to the trip and continue 1 week after returning  30 tablet  0  . FeFum-FePoly-FA-B Cmp-C-Biot (INTEGRA PLUS) CAPS Take one po daily  30 capsule  2  . fluticasone (FLONASE) 50 MCG/ACT nasal spray Place 2 sprays into the nose daily.  16 g  12  . levocetirizine (XYZAL) 5 MG tablet TAKE 1 TABLET BY MOUTH IN THE EVENING  30 tablet  11  . levocetirizine (XYZAL) 5 MG tablet TAKE 1 TABLET BY MOUTH IN THE EVENING  30 tablet  11   No current facility-administered medications on file prior to visit.    Past Surgical History  Procedure Laterality Date  . Achilles tendon repair Right 10/2011    No Known Allergies  History   Social History  . Marital Status: Married    Spouse Name: N/A    Number of Children: 0  . Years of Education: N/A   Occupational History  . dept [employer] --w/s    Social History Main Topics  . Smoking status: Never Smoker   . Smokeless tobacco: Never Used  . Alcohol Use: Yes     Comment: rare  . Drug Use: No  . Sexual Activity: Yes    Partners: Female   Other Topics Concern  . Not on file   Social History  Narrative   Exercise-- 3x a week    Family History  Problem Relation Age of Onset  . Diabetes Mother   . Hypertension Mother   . Diabetes type II Brother   . Hypertension Brother   . Diabetes Sister   . Colon cancer Maternal Aunt   . Heart disease Father     defibrillator    BP 111/69  Pulse 51  Ht 5\' 9"  (1.753 m)  Wt 225 lb (102.059 kg)  BMI 33.21 kg/m2  Review of Systems See HPI above.    Objective:   Physical Exam Gen: NAD  L shoulder: No swelling, ecchymoses.  No gross deformity. No TTP at [facility], biceps tendon, elsewhere about shoulder. FROM without painful arc. Negatve Hawkins, negative Neers. Negative Speeds, Yergasons. Strength 5/5 with empty can and resisted internal/external rotation.  Pain with empty  can. Negative apprehension. NV intact distally.    Assessment & Plan:  1. Left shoulder pain - consistent with rotator cuff impingement.  Discussed options at this point.  He wants to try nitro patches - discussed risks of headaches, local irritation.  Consider repeating injection, going ahead with MRI if not improving.  F/u in 6 weeks for reevaluation.

## 2013-01-27 NOTE — Progress Notes (Signed)
Quick Note:  Please inform the patient that lab work was normal and to continue current plan of action ______ 

## 2013-02-18 ENCOUNTER — Other Ambulatory Visit (INDEPENDENT_AMBULATORY_CARE_PROVIDER_SITE_OTHER): Payer: Federal, State, Local not specified - PPO

## 2013-02-18 ENCOUNTER — Other Ambulatory Visit: Payer: Self-pay | Admitting: Physician Assistant

## 2013-02-18 DIAGNOSIS — D509 Iron deficiency anemia, unspecified: Secondary | ICD-10-CM

## 2013-02-18 NOTE — Telephone Encounter (Signed)
Amy would like the patient to come and have his Ferritin level drawn in the lab.  Depending on what the ferritin level is, she will then decide if she wants him to have another refill on the Integra iron Supplement.  I told him I was putting the order in the system today for the Ferritin and he can come anytime between 7:30 AM to 5:30 PM .  He thought he could come any day this week.

## 2013-03-08 ENCOUNTER — Ambulatory Visit: Payer: Federal, State, Local not specified - PPO | Admitting: Family Medicine

## 2013-06-22 ENCOUNTER — Other Ambulatory Visit: Payer: Self-pay | Admitting: *Deleted

## 2013-06-22 ENCOUNTER — Telehealth: Payer: Self-pay | Admitting: *Deleted

## 2013-06-22 MED ORDER — ALBUTEROL SULFATE HFA 108 (90 BASE) MCG/ACT IN AERS: 2.0000 | INHALATION_SPRAY | Freq: Four times a day (QID) | RESPIRATORY_TRACT | Status: DC | PRN

## 2013-06-22 NOTE — Telephone Encounter (Signed)
Patient called and requested a refill for albuterol (PROVENTIL HFA) 108 (90 BASE) MCG/ACT inhaler    Pharmacy [id number] - [place], [place]

## 2013-06-22 NOTE — Telephone Encounter (Signed)
Med refilled for one month. OV past due. JG//CMA

## 2013-07-20 ENCOUNTER — Telehealth: Payer: Self-pay

## 2013-07-20 NOTE — Telephone Encounter (Signed)
Left message for call back Non-identifiable   Flu- Td- 10/02/05

## 2013-07-21 NOTE — Telephone Encounter (Signed)
Medication List and allergies:  Updated and Reviewed  90 day supply/mail order: n/a Local prescriptions:  [id number] [place], [place] [address]  Immunization due:  Influenza-declined  A/P: No changes to personal, family or Cross Flu-declined Tdap-10/02/05   To discuss with provider: Nothing at this time.

## 2013-07-22 ENCOUNTER — Encounter: Payer: Self-pay | Admitting: Family Medicine

## 2013-07-22 ENCOUNTER — Ambulatory Visit (INDEPENDENT_AMBULATORY_CARE_PROVIDER_SITE_OTHER): Payer: Federal, State, Local not specified - PPO | Admitting: Family Medicine

## 2013-07-22 VITALS — BP 118/60 | HR 71 | Temp 98.3°F | Ht 70.0 in | Wt 227.0 lb

## 2013-07-22 DIAGNOSIS — E669 Obesity, unspecified: Secondary | ICD-10-CM | POA: Insufficient documentation

## 2013-07-22 DIAGNOSIS — E785 Hyperlipidemia, unspecified: Secondary | ICD-10-CM

## 2013-07-22 DIAGNOSIS — Z0000 Encounter for general adult medical examination without abnormal findings: Secondary | ICD-10-CM

## 2013-07-22 LAB — HEPATIC FUNCTION PANEL
ALT: 30 U/L (ref 0–53)
AST: 31 U/L (ref 0–37)
Albumin: 4 g/dL (ref 3.5–5.2)
Alkaline Phosphatase: 64 U/L (ref 39–117)
BILIRUBIN DIRECT: 0.1 mg/dL (ref 0.0–0.3)
TOTAL PROTEIN: 6.8 g/dL (ref 6.0–8.3)
Total Bilirubin: 0.9 mg/dL (ref 0.3–1.2)

## 2013-07-22 LAB — BASIC METABOLIC PANEL
BUN: 12 mg/dL (ref 6–23)
CALCIUM: 9.3 mg/dL (ref 8.4–10.5)
CO2: 26 mEq/L (ref 19–32)
CREATININE: 1.3 mg/dL (ref 0.4–1.5)
Chloride: 106 mEq/L (ref 96–112)
GFR: 75.4 mL/min (ref >60.00)
Glucose, Bld: 83 mg/dL (ref 70–99)
Potassium: 4 mEq/L (ref 3.5–5.1)
Sodium: 138 mEq/L (ref 135–145)

## 2013-07-22 LAB — CBC WITH DIFFERENTIAL/PLATELET
BASOS PCT: 0.7 % (ref 0.0–3.0)
Basophils Absolute: 0 10*3/uL (ref 0.0–0.1)
EOS ABS: 0.1 10*3/uL (ref 0.0–0.7)
Eosinophils Relative: 3.2 % (ref 0.0–5.0)
HCT: 41.9 % (ref 39.0–52.0)
Hemoglobin: 13.7 g/dL (ref 13.0–17.0)
LYMPHS PCT: 42.5 % (ref 12.0–46.0)
Lymphs Abs: 1.3 10*3/uL (ref 0.7–4.0)
MCHC: 32.8 g/dL (ref 30.0–36.0)
MCV: 90.3 fl (ref 78.0–100.0)
Monocytes Absolute: 0.4 10*3/uL (ref 0.1–1.0)
Monocytes Relative: 11.4 % (ref 3.0–12.0)
NEUTROS PCT: 42.2 % — AB (ref 43.0–77.0)
Neutro Abs: 1.3 10*3/uL — ABNORMAL LOW (ref 1.4–7.7)
PLATELETS: 199 10*3/uL (ref 150.0–400.0)
RBC: 4.64 Mil/uL (ref 4.22–5.81)
RDW: 12.9 % (ref 11.5–14.6)
WBC: 3.1 10*3/uL — ABNORMAL LOW (ref 4.5–10.5)

## 2013-07-22 LAB — POCT URINALYSIS DIPSTICK
BILIRUBIN UA: NEGATIVE
Glucose, UA: NEGATIVE
Ketones, UA: NEGATIVE
Leukocytes, UA: NEGATIVE
NITRITE UA: NEGATIVE
PH UA: 7.5
PROTEIN UA: NEGATIVE
RBC UA: NEGATIVE
Spec Grav, UA: 1.01
Urobilinogen, UA: 0.2

## 2013-07-22 LAB — LIPID PANEL
Cholesterol: 195 mg/dL (ref 0–200)
HDL: 54.3 mg/dL (ref >39.00)
LDL Cholesterol: 126 mg/dL — ABNORMAL HIGH (ref 0–99)
TRIGLYCERIDES: 72 mg/dL (ref 0.0–149.0)
Total CHOL/HDL Ratio: 4
VLDL: 14.4 mg/dL (ref 0.0–40.0)

## 2013-07-22 LAB — TSH: TSH: 2.13 u[IU]/mL (ref 0.35–5.50)

## 2013-07-22 LAB — PSA: PSA: 1.73 ng/mL (ref 0.10–4.00)

## 2013-07-22 NOTE — Progress Notes (Signed)
Patient ID: Juan Velazquez, male   DOB: 05-25-1965, 48 y.o.   MRN: [id number]   Subjective:    Patient ID: Juan Velazquez, male    DOB: Apr 04, 1966, 48 y.o.   MRN: [id number] HPI Pt here for cpe and labs   Past Medical History  Diagnosis Date  . Hyperlipidemia   . Allergy   . Asthma   . GERD (gastroesophageal reflux disease)   . Hyperplastic colonic polyp 09/01/2009  . Gastric polyp 08/06/2010   History   Social History  . Marital Status: Married    Spouse Name: N/A    Number of Children: 0  . Years of Education: N/A   Occupational History  . dept [employer] --w/s    Social History Main Topics  . Smoking status: Never Smoker   . Smokeless tobacco: Never Used  . Alcohol Use: Yes     Comment: rare  . Drug Use: No  . Sexual Activity: Yes    Partners: Female   Other Topics Concern  . Not on file   Social History Narrative   Exercise-- 3x a week   Family History  Problem Relation Age of Onset  . Diabetes Mother   . Hypertension Mother   . Diabetes type II Brother   . Hypertension Brother   . Diabetes Sister   . Colon cancer Maternal Aunt   . Heart disease Father     defibrillator   Current outpatient prescriptions:albuterol (PROVENTIL HFA) 108 (90 BASE) MCG/ACT inhaler, Inhale 2 puffs into the lungs every 6 (six) hours as needed., Disp: 1 Inhaler, Rfl: 0;  atorvastatin (LIPITOR) 10 MG tablet, 1 tab by mouth at bedtime--Labs due, Disp: 30 tablet, Rfl: 0;  levocetirizine (XYZAL) 5 MG tablet, TAKE 1 TABLET BY MOUTH IN THE EVENING, Disp: 30 tablet, Rfl: 11 No Known Allergies Review of Systems  Constitutional: Negative for activity change, appetite change and fatigue.  HENT: Negative for hearing loss, congestion, tinnitus and ear discharge.  dentist q58m Eyes: Negative for visual disturbance (see optho q1y -- vision corrected to 20/20 with glasses).  Respiratory: Negative for cough, chest tightness and shortness of breath.   Cardiovascular: Negative for chest pain,  palpitations and leg swelling.  Gastrointestinal: Negative for abdominal pain, diarrhea, constipation and abdominal distention.  Genitourinary: Negative for urgency, frequency, decreased urine volume and difficulty urinating.  Musculoskeletal: Negative for back pain, arthralgias and gait problem.  Skin: Negative for color change, pallor and rash.  Neurological: Negative for dizziness, light-headedness, numbness and headaches.  Hematological: Negative for adenopathy. Does not bruise/bleed easily.  Psychiatric/Behavioral: Negative for suicidal ideas, confusion, sleep disturbance, self-injury, dysphoric mood, decreased concentration and agitation.           Objective:    BP 118/60  Pulse 71  Temp(Src) 98.3 F (36.8 C) (Oral)  Ht 5\' 10"  (1.778 m)  Wt 227 lb (102.967 kg)  BMI 32.57 kg/m2  SpO2 98% General appearance: alert, cooperative, appears stated age and no distress Head: Normocephalic, without obvious abnormality, atraumatic Eyes: conjunctivae/corneas clear. PERRL, EOM's intact. Fundi benign. Ears: normal TM's and external ear canals both ears Nose: Nares normal. Septum midline. Mucosa normal. No drainage or sinus tenderness. Throat: lips, mucosa, and tongue normal; teeth and gums normal Neck: no adenopathy, no carotid bruit, no JVD, supple, symmetrical, trachea midline and thyroid not enlarged, symmetric, no tenderness/mass/nodules Back: symmetric, no curvature. ROM normal. No CVA tenderness. Lungs: clear to auscultation bilaterally Chest wall: no tenderness Heart: regular rate and rhythm, S1, S2 normal,  no murmur, click, rub or gallop Abdomen: soft, non-tender; bowel sounds normal; no masses,  no organomegaly Male genitalia: normal Rectal: normal tone, normal prostate, no masses or tenderness and soft brown guaiac negative stool noted Extremities: extremities normal, atraumatic, no cyanosis or edema Pulses: 2+ and symmetric Skin: Skin color, texture, turgor normal. No  rashes or lesions Lymph nodes: Cervical, supraclavicular, and axillary nodes normal. Neurologic: Alert and oriented X 3, normal strength and tone. Normal symmetric reflexes. Normal coordination and gait Psych-- no depression, no anxiety       Assessment & Plan:  1. Other and unspecified hyperlipidemia Check labs - Hepatic function panel - Lipid panel  2. Preventative health care ghm utd Check labs See avs - Basic metabolic panel - CBC with Differential - Hepatic function panel - Lipid panel - POCT urinalysis dipstick - TSH - PSA

## 2013-07-22 NOTE — Patient Instructions (Signed)
Preventive Care for Adults, Male A healthy lifestyle and preventive care can promote health and wellness. Preventive health guidelines for men include the following key practices:  A routine yearly physical is a good way to check with your health care provider about your health and preventative screening. It is a chance to share any concerns and updates on your health and to receive a thorough exam.  Visit your dentist for a routine exam and preventative care every 6 months. Brush your teeth twice a day and floss once a day. Good oral hygiene prevents tooth decay and gum disease.  The frequency of eye exams is based on your age, health, family medical history, use of contact lenses, and other factors. Follow your health care provider's recommendations for frequency of eye exams.  Eat a healthy diet. Foods such as vegetables, fruits, whole grains, low-fat dairy products, and lean protein foods contain the nutrients you need without too many calories. Decrease your intake of foods high in solid fats, added sugars, and salt. Eat the right amount of calories for you.Get information about a proper diet from your health care provider, if necessary.  Regular physical exercise is one of the most important things you can do for your health. Most adults should get at least 150 minutes of moderate-intensity exercise (any activity that increases your heart rate and causes you to sweat) each week. In addition, most adults need muscle-strengthening exercises on 2 or more days a week.  Maintain a healthy weight. The body mass index (BMI) is a screening tool to identify possible weight problems. It provides an estimate of body fat based on height and weight. Your health care provider can find your BMI and can help you achieve or maintain a healthy weight.For adults 20 years and older:  A BMI below 18.5 is considered underweight.  A BMI of 18.5 to 24.9 is normal.  A BMI of 25 to 29.9 is considered  overweight.  A BMI of 30 and above is considered obese.  Maintain normal blood lipids and cholesterol levels by exercising and minimizing your intake of saturated fat. Eat a balanced diet with plenty of fruit and vegetables. Blood tests for lipids and cholesterol should begin at age 42 and be repeated every 5 years. If your lipid or cholesterol levels are high, you are over 50, or you are at high risk for heart disease, you may need your cholesterol levels checked more frequently.Ongoing high lipid and cholesterol levels should be treated with medicines if diet and exercise are not working.  If you smoke, find out from your health care provider how to quit. If you do not use tobacco, do not start.  Lung cancer screening is recommended for adults aged 24 80 years who are at high risk for developing lung cancer because of a history of smoking. A yearly low-dose CT scan of the lungs is recommended for people who have at least a 30-pack-year history of smoking and are a current smoker or have quit within the past 15 years. A pack year of smoking is smoking an average of 1 pack of cigarettes a day for 1 year (for example: 1 pack a day for 30 years or 2 packs a day for 15 years). Yearly screening should continue until the smoker has stopped smoking for at least 15 years. Yearly screening should be stopped for people who develop a health problem that would prevent them from having lung cancer treatment.  If you choose to drink alcohol, do not have  more than 2 drinks per day. One drink is considered to be 12 ounces (355 mL) of beer, 5 ounces (148 mL) of wine, or 1.5 ounces (44 mL) of liquor.  Avoid use of street drugs. Do not share needles with anyone. Ask for help if you need support or instructions about stopping the use of drugs.  High blood pressure causes heart disease and increases the risk of stroke. Your blood pressure should be checked at least every 1 2 years. Ongoing high blood pressure should be  treated with medicines, if weight loss and exercise are not effective.  If you are 75 47 years old, ask your health care provider if you should take aspirin to prevent heart disease.  Diabetes screening involves taking a blood sample to check your fasting blood sugar level. This should be done once every 3 years, after age 19, if you are within normal weight and without risk factors for diabetes. Testing should be considered at a younger age or be carried out more frequently if you are overweight and have at least 1 risk factor for diabetes.  Colorectal cancer can be detected and often prevented. Most routine colorectal cancer screening begins at the age of 47 and continues through age 80. However, your health care provider may recommend screening at an earlier age if you have risk factors for colon cancer. On a yearly basis, your health care provider may provide home test kits to check for hidden blood in the stool. Use of a small camera at the end of a tube to directly examine the colon (sigmoidoscopy or colonoscopy) can detect the earliest forms of colorectal cancer. Talk to your health care provider about this at age 66, when routine screening begins. Direct exam of the colon should be repeated every 5 10 years through age 19, unless early forms of precancerous polyps or small growths are found.  People who are at an increased risk for hepatitis B should be screened for this virus. You are considered at high risk for hepatitis B if:  You were born in a country where hepatitis B occurs often. Talk with your health care provider about which countries are considered high-risk.  Your parents were born in a high-risk country and you have not received a shot to protect against hepatitis B (hepatitis B vaccine).  You have HIV or AIDS.  You use needles to inject street drugs.  You live with, or have sex with, someone who has hepatitis B.  You are a man who has sex with other men (MSM).  You get  hemodialysis treatment.  You take certain medicines for conditions such as cancer, organ transplantation, and autoimmune conditions.  Hepatitis C blood testing is recommended for all people born from [birth year] through 1965 and any individual with known risks for hepatitis C.  Practice safe sex. Use condoms and avoid high-risk sexual practices to reduce the spread of sexually transmitted infections (STIs). STIs include gonorrhea, chlamydia, syphilis, trichomonas, herpes, HPV, and human immunodeficiency virus (HIV). Herpes, HIV, and HPV are viral illnesses that have no cure. They can result in disability, cancer, and death.  A one-time screening for abdominal aortic aneurysm (AAA) and surgical repair of large AAAs by ultrasound are recommended for men ages 94 to 74 years who are current or former smokers.  Healthy men should no longer receive prostate-specific antigen (PSA) blood tests as part of routine cancer screening. Talk with your health care provider about prostate cancer screening.  Testicular cancer screening is not recommended  for adult males who have no symptoms. Screening includes self-exam, a health care provider exam, and other screening tests. Consult with your health care provider about any symptoms you have or any concerns you have about testicular cancer.  Use sunscreen. Apply sunscreen liberally and repeatedly throughout the day. You should seek shade when your shadow is shorter than you. Protect yourself by wearing long sleeves, pants, a wide-brimmed hat, and sunglasses year round, whenever you are outdoors.  Once a month, do a whole-body skin exam, using a mirror to look at the skin on your back. Tell your health care provider about new moles, moles that have irregular borders, moles that are larger than a pencil eraser, or moles that have changed in shape or color.  Stay current with required vaccines (immunizations).  Influenza vaccine. All adults should be immunized every  year.  Tetanus, diphtheria, and acellular pertussis (Td, Tdap) vaccine. An adult who has not previously received Tdap or who does not know his vaccine status should receive 1 dose of Tdap. This initial dose should be followed by tetanus and diphtheria toxoids (Td) booster doses every 10 years. Adults with an unknown or incomplete history of completing a 3-dose immunization series with Td-containing vaccines should begin or complete a primary immunization series including a Tdap dose. Adults should receive a Td booster every 10 years.  Varicella vaccine. An adult without evidence of immunity to varicella should receive 2 doses or a second dose if he has previously received 1 dose.  Human papillomavirus (HPV) vaccine. Males aged 44 21 years who have not received the vaccine previously should receive the 3-dose series. Males aged 43 26 years may be immunized. Immunization is recommended through the age of 50 years for any male who has sex with males and did not get any or all doses earlier. Immunization is recommended for any person with an immunocompromised condition through the age of 23 years if he did not get any or all doses earlier. During the 3-dose series, the second dose should be obtained 4 8 weeks after the first dose. The third dose should be obtained 24 weeks after the first dose and 16 weeks after the second dose.  Zoster vaccine. One dose is recommended for adults aged 96 years or older unless certain conditions are present.  Measles, mumps, and rubella (MMR) vaccine. Adults born before 55 generally are considered immune to measles and mumps. Adults born in [birth year] or later should have 1 or more doses of MMR vaccine unless there is a contraindication to the vaccine or there is laboratory evidence of immunity to each of the three diseases. A routine second dose of MMR vaccine should be obtained at least 28 days after the first dose for students attending postsecondary schools, health care  workers, or international travelers. People who received inactivated measles vaccine or an unknown type of measles vaccine during 1963 1967 should receive 2 doses of MMR vaccine. People who received inactivated mumps vaccine or an unknown type of mumps vaccine before 1979 and are at high risk for mumps infection should consider immunization with 2 doses of MMR vaccine. Unvaccinated health care workers born before [birth year] who lack laboratory evidence of measles, mumps, or rubella immunity or laboratory confirmation of disease should consider measles and mumps immunization with 2 doses of MMR vaccine or rubella immunization with 1 dose of MMR vaccine.  Pneumococcal 13-valent conjugate (PCV13) vaccine. When indicated, a person who is uncertain of his immunization history and has no record of immunization  should receive the PCV13 vaccine. An adult aged 67 years or older who has certain medical conditions and has not been previously immunized should receive 1 dose of PCV13 vaccine. This PCV13 should be followed with a dose of pneumococcal polysaccharide (PPSV23) vaccine. The PPSV23 vaccine dose should be obtained at least 8 weeks after the dose of PCV13 vaccine. An adult aged 79 years or older who has certain medical conditions and previously received 1 or more doses of PPSV23 vaccine should receive 1 dose of PCV13. The PCV13 vaccine dose should be obtained 1 or more years after the last PPSV23 vaccine dose.  Pneumococcal polysaccharide (PPSV23) vaccine. When PCV13 is also indicated, PCV13 should be obtained first. All adults aged 74 years and older should be immunized. An adult younger than age 50 years who has certain medical conditions should be immunized. Any person who resides in a nursing home or long-term care facility should be immunized. An adult smoker should be immunized. People with an immunocompromised condition and certain other conditions should receive both PCV13 and PPSV23 vaccines. People with human  immunodeficiency virus (HIV) infection should be immunized as soon as possible after diagnosis. Immunization during chemotherapy or radiation therapy should be avoided. Routine use of PPSV23 vaccine is not recommended for American Indians, [place] Natives, or people younger than 65 years unless there are medical conditions that require PPSV23 vaccine. When indicated, people who have unknown immunization and have no record of immunization should receive PPSV23 vaccine. One-time revaccination 5 years after the first dose of PPSV23 is recommended for people aged 41 64 years who have chronic kidney failure, nephrotic syndrome, asplenia, or immunocompromised conditions. People who received 1 2 doses of PPSV23 before age 15 years should receive another dose of PPSV23 vaccine at age 48 years or later if at least 5 years have passed since the previous dose. Doses of PPSV23 are not needed for people immunized with PPSV23 at or after age 69 years.  Meningococcal vaccine. Adults with asplenia or persistent complement component deficiencies should receive 2 doses of quadrivalent meningococcal conjugate (MenACWY-D) vaccine. The doses should be obtained at least 2 months apart. Microbiologists working with certain meningococcal bacteria, [place] recruits, people at risk during an outbreak, and people who travel to or live in countries with a high rate of meningitis should be immunized. A first-year college student up through age 7 years who is living in a residence hall should receive a dose if he did not receive a dose on or after his 16th birthday. Adults who have certain high-risk conditions should receive one or more doses of vaccine.  Hepatitis A vaccine. Adults who wish to be protected from this disease, have certain high-risk conditions, work with hepatitis A-infected animals, work in hepatitis A research labs, or travel to or work in countries with a high rate of hepatitis A should be immunized. Adults who were  previously unvaccinated and who anticipate close contact with an international adoptee during the first 60 days after arrival in the [place] from a country with a high rate of hepatitis A should be immunized.  Hepatitis B vaccine. Adults who wish to be protected from this disease, have certain high-risk conditions, may be exposed to blood or other infectious body fluids, are household contacts or sex partners of hepatitis B positive people, are clients or workers in certain care facilities, or travel to or work in countries with a high rate of hepatitis B should be immunized.  Haemophilus influenzae type b (Hib) vaccine. A  previously unvaccinated person with asplenia or sickle cell disease or having a scheduled splenectomy should receive 1 dose of Hib vaccine. Regardless of previous immunization, a recipient of a hematopoietic stem cell transplant should receive a 3-dose series 6 12 months after his successful transplant. Hib vaccine is not recommended for adults with HIV infection. Preventive Service / Frequency Ages 62 to 3  Blood pressure check.** / Every 1 to 2 years.  Lipid and cholesterol check.** / Every 5 years beginning at age 43.  Hepatitis C blood test.** / For any individual with known risks for hepatitis C.  Skin self-exam. / Monthly.  Influenza vaccine. / Every year.  Tetanus, diphtheria, and acellular pertussis (Tdap, Td) vaccine.** / Consult your health care provider. 1 dose of Td every 10 years.  Varicella vaccine.** / Consult your health care provider.  HPV vaccine. / 3 doses over 6 months, if 48 or younger.  Measles, mumps, rubella (MMR) vaccine.** / You need at least 1 dose of MMR if you were born in 1957 or later. You may also need a second dose.  Pneumococcal 13-valent conjugate (PCV13) vaccine.** / Consult your health care provider.  Pneumococcal polysaccharide (PPSV23) vaccine.** / 1 to 2 doses if you smoke cigarettes or if you have certain  conditions.  Meningococcal vaccine.** / 1 dose if you are age 8 to 70 years and a Market researcher living in a residence hall, or have one of several medical conditions. You may also need additional booster doses.  Hepatitis A vaccine.** / Consult your health care provider.  Hepatitis B vaccine.** / Consult your health care provider.  Haemophilus influenzae type b (Hib) vaccine.** / Consult your health care provider. Ages 48 to 32  Blood pressure check.** / Every 1 to 2 years.  Lipid and cholesterol check.** / Every 5 years beginning at age 38.  Lung cancer screening. / Every year if you are aged 40 80 years and have a 30-pack-year history of smoking and currently smoke or have quit within the past 15 years. Yearly screening is stopped once you have quit smoking for at least 15 years or develop a health problem that would prevent you from having lung cancer treatment.  Fecal occult blood test (FOBT) of stool. / Every year beginning at age 4 and continuing until age 70. You may not have to do this test if you get a colonoscopy every 10 years.  Flexible sigmoidoscopy** or colonoscopy.** / Every 5 years for a flexible sigmoidoscopy or every 10 years for a colonoscopy beginning at age 76 and continuing until age 62.  Hepatitis C blood test.** / For all people born from [birth year] through 1965 and any individual with known risks for hepatitis C.  Skin self-exam. / Monthly.  Influenza vaccine. / Every year.  Tetanus, diphtheria, and acellular pertussis (Tdap/Td) vaccine.** / Consult your health care provider. 1 dose of Td every 10 years.  Varicella vaccine.** / Consult your health care provider.  Zoster vaccine.** / 1 dose for adults aged 60 years or older.  Measles, mumps, rubella (MMR) vaccine.** / You need at least 1 dose of MMR if you were born in 1957 or later. You may also need a second dose.  Pneumococcal 13-valent conjugate (PCV13) vaccine.** / Consult your health care  provider.  Pneumococcal polysaccharide (PPSV23) vaccine.** / 1 to 2 doses if you smoke cigarettes or if you have certain conditions.  Meningococcal vaccine.** / Consult your health care provider.  Hepatitis A vaccine.** / Consult your health care  provider.  Hepatitis B vaccine.** / Consult your health care provider.  Haemophilus influenzae type b (Hib) vaccine.** / Consult your health care provider. Ages 65 and over  Blood pressure check.** / Every 1 to 2 years.  Lipid and cholesterol check.**/ Every 5 years beginning at age 20.  Lung cancer screening. / Every year if you are aged 55 80 years and have a 30-pack-year history of smoking and currently smoke or have quit within the past 15 years. Yearly screening is stopped once you have quit smoking for at least 15 years or develop a health problem that would prevent you from having lung cancer treatment.  Fecal occult blood test (FOBT) of stool. / Every year beginning at age 50 and continuing until age 75. You may not have to do this test if you get a colonoscopy every 10 years.  Flexible sigmoidoscopy** or colonoscopy.** / Every 5 years for a flexible sigmoidoscopy or every 10 years for a colonoscopy beginning at age 50 and continuing until age 75.  Hepatitis C blood test.** / For all people born from 1945 through 1965 and any individual with known risks for hepatitis C.  Abdominal aortic aneurysm (AAA) screening.** / A one-time screening for ages 65 to 75 years who are current or former smokers.  Skin self-exam. / Monthly.  Influenza vaccine. / Every year.  Tetanus, diphtheria, and acellular pertussis (Tdap/Td) vaccine.** / 1 dose of Td every 10 years.  Varicella vaccine.** / Consult your health care provider.  Zoster vaccine.** / 1 dose for adults aged 60 years or older.  Pneumococcal 13-valent conjugate (PCV13) vaccine.** / Consult your health care provider.  Pneumococcal polysaccharide (PPSV23) vaccine.** / 1 dose for all  adults aged 65 years and older.  Meningococcal vaccine.** / Consult your health care provider.  Hepatitis A vaccine.** / Consult your health care provider.  Hepatitis B vaccine.** / Consult your health care provider.  Haemophilus influenzae type b (Hib) vaccine.** / Consult your health care provider. **Family history and personal history of risk and conditions may change your health care provider's recommendations. Document Released: 07/02/2001 Document Revised: 02/24/2013 Document Reviewed: 10/01/2010 [employer] Patient Information 2014 [employer].  

## 2013-07-22 NOTE — Progress Notes (Signed)
Pre-visit discussion using our clinic review tool. No additional management support is needed unless otherwise documented below in the visit note.  

## 2013-07-27 ENCOUNTER — Other Ambulatory Visit: Payer: Self-pay

## 2013-07-27 DIAGNOSIS — E785 Hyperlipidemia, unspecified: Secondary | ICD-10-CM

## 2013-07-27 MED ORDER — ATORVASTATIN CALCIUM 20 MG PO TABS: 20.0000 mg | ORAL_TABLET | Freq: Every day | ORAL | Status: DC

## 2013-09-23 ENCOUNTER — Encounter: Payer: Self-pay | Admitting: Family Medicine

## 2013-09-23 ENCOUNTER — Encounter (INDEPENDENT_AMBULATORY_CARE_PROVIDER_SITE_OTHER): Payer: Self-pay

## 2013-09-23 ENCOUNTER — Ambulatory Visit (INDEPENDENT_AMBULATORY_CARE_PROVIDER_SITE_OTHER): Payer: Federal, State, Local not specified - PPO | Admitting: Family Medicine

## 2013-09-23 VITALS — BP 122/72 | HR 58 | Ht 69.0 in | Wt 225.0 lb

## 2013-09-23 DIAGNOSIS — S86119A Strain of other muscle(s) and tendon(s) of posterior muscle group at lower leg level, unspecified leg, initial encounter: Secondary | ICD-10-CM

## 2013-09-23 DIAGNOSIS — S86819A Strain of other muscle(s) and tendon(s) at lower leg level, unspecified leg, initial encounter: Secondary | ICD-10-CM

## 2013-09-23 DIAGNOSIS — S838X9A Sprain of other specified parts of unspecified knee, initial encounter: Secondary | ICD-10-CM

## 2013-09-23 NOTE — Patient Instructions (Addendum)
You have a calf strain/partial tear (tennis leg) Compression sleeve or ace wrap to help with swelling and pain for next 6 weeks. Icing for 15 minutes at a time 3-4 times a day next 24 hours then can try heat or ice. Heel lifts either in temporary orthotics or on their own to prevent further strain when up and walking around. Aleve 2 tabs twice a day with food OR ibuprofen 600mg  three times a day with food for pain and inflammation for next 7-10 days then as needed. Heel raise exercise when able - start with both legs (not before a week from now) - work way up to 3 sets of 10.  Then do on one leg.  Then finally on a step as we discussed. Do basic up/down ankle exercises now without resistance a couple times a day. Cycling with low resistance or swimming for cross training in meantime are ok. Follow up with me in 1 month or as needed.

## 2013-09-24 ENCOUNTER — Encounter: Payer: Self-pay | Admitting: Family Medicine

## 2013-09-24 DIAGNOSIS — S86119A Strain of other muscle(s) and tendon(s) of posterior muscle group at lower leg level, unspecified leg, initial encounter: Secondary | ICD-10-CM | POA: Insufficient documentation

## 2013-09-24 NOTE — Progress Notes (Signed)
Patient ID: Juan Velazquez, male   DOB: 06-Nov-1965, 48 y.o.   MRN: [id number]  PCP: Garnet Koyanagi, DO  Subjective:   HPI: Patient is a 48 y.o. male here for left calf injury.  Patient reports on 5/5 during a softball game he hit the ball and started to run toward first base when he felt a pop in posterior left calf. Had to stop playing as a result. This is opposite side from his achilles rupture. Slight swelling but no bruising. Has improved to 4/10 pain currently. Has been icing, elevating.  Past Medical History  Diagnosis Date  . Hyperlipidemia   . Allergy   . Asthma   . GERD (gastroesophageal reflux disease)   . Hyperplastic colonic polyp 09/01/2009  . Gastric polyp 08/06/2010    Current Outpatient Prescriptions on File Prior to Visit  Medication Sig Dispense Refill  . albuterol (PROVENTIL HFA) 108 (90 BASE) MCG/ACT inhaler Inhale 2 puffs into the lungs every 6 (six) hours as needed.  1 Inhaler  0  . atorvastatin (LIPITOR) 20 MG tablet Take 1 tablet (20 mg total) by mouth at bedtime.  30 tablet  2  . levocetirizine (XYZAL) 5 MG tablet TAKE 1 TABLET BY MOUTH IN THE EVENING  30 tablet  11   No current facility-administered medications on file prior to visit.    Past Surgical History  Procedure Laterality Date  . Achilles tendon repair Right 10/2011    No Known Allergies  History   Social History  . Marital Status: Married    Spouse Name: N/A    Number of Children: 0  . Years of Education: N/A   Occupational History  . dept [employer] --w/s    Social History Main Topics  . Smoking status: Never Smoker   . Smokeless tobacco: Never Used  . Alcohol Use: Yes     Comment: rare  . Drug Use: No  . Sexual Activity: Yes    Partners: Female   Other Topics Concern  . Not on file   Social History Narrative   Exercise-- 3x a week    Family History  Problem Relation Age of Onset  . Diabetes Mother   . Hypertension Mother   . Kidney disease Mother   . Diabetes  type II Brother   . Hypertension Brother   . Diabetes Brother   . Diabetes Sister   . Colon cancer Maternal Aunt   . Heart disease Father     defibrillator    BP 122/72  Pulse 58  Ht 5\' 9"  (1.753 m)  Wt 225 lb (102.059 kg)  BMI 33.21 kg/m2  Review of Systems: See HPI above.    Objective:  Physical Exam:  Gen: NAD  Left lower leg: No palpable defect, swelling, bruising. TTP medial gastroc proximally.  No achilles, other tenderness. FROM ankle and knee.  Pain with calf raise - unable to complete this.  Pain with passive dorsiflexion. NVI distally. Negative thompsons.    Assessment & Plan:  1. Left calf strain - compression sleeve, icing, heel lifts, regular nsaids.  Elevation and rest.  Ok for cycling, swimming when tolerated.  Can start home exercise program when pain is < 3 on a scale of 1-10 - reviewed this today.  F/u in 1 month or prn.

## 2013-09-24 NOTE — Assessment & Plan Note (Signed)
compression sleeve, icing, heel lifts, regular nsaids.  Elevation and rest.  Ok for cycling, swimming when tolerated.  Can start home exercise program when pain is < 3 on a scale of 1-10 - reviewed this today.  F/u in 1 month or prn.

## 2013-09-27 ENCOUNTER — Telehealth: Payer: Self-pay | Admitting: Family Medicine

## 2013-09-27 NOTE — Telephone Encounter (Signed)
That's not unexpected having pulled his calf muscle.  I would encourage him to continue using it unless the swelling is more uncomfortable than his calf strain is.  Nevin Bloodgood - please call patient with message.

## 2013-10-21 ENCOUNTER — Other Ambulatory Visit: Payer: Self-pay | Admitting: Family Medicine

## 2013-11-26 ENCOUNTER — Other Ambulatory Visit: Payer: Self-pay | Admitting: Family Medicine

## 2013-11-26 NOTE — Telephone Encounter (Signed)
Med filled.  

## 2014-01-31 ENCOUNTER — Other Ambulatory Visit: Payer: Self-pay | Admitting: Family Medicine

## 2014-03-31 ENCOUNTER — Telehealth: Payer: Self-pay

## 2014-03-31 DIAGNOSIS — E785 Hyperlipidemia, unspecified: Secondary | ICD-10-CM

## 2014-03-31 NOTE — Telephone Encounter (Signed)
Order in      KP 

## 2014-03-31 NOTE — Telephone Encounter (Signed)
-----   Message from Irven Baltimore sent at 03/31/2014 12:18 PM EST ----- Patient scheduled lab appt.   lipitor states that labs are due now. Please order labs

## 2014-04-04 ENCOUNTER — Other Ambulatory Visit (INDEPENDENT_AMBULATORY_CARE_PROVIDER_SITE_OTHER): Payer: Federal, State, Local not specified - PPO

## 2014-04-04 DIAGNOSIS — E785 Hyperlipidemia, unspecified: Secondary | ICD-10-CM

## 2014-04-04 LAB — LIPID PANEL
CHOLESTEROL: 214 mg/dL — AB (ref 0–200)
HDL: 46.6 mg/dL (ref >39.00)
LDL Cholesterol: 151 mg/dL — ABNORMAL HIGH (ref 0–99)
NONHDL: 167.4
Total CHOL/HDL Ratio: 5
Triglycerides: 83 mg/dL (ref 0.0–149.0)
VLDL: 16.6 mg/dL (ref 0.0–40.0)

## 2014-04-04 LAB — HEPATIC FUNCTION PANEL
ALBUMIN: 4.4 g/dL (ref 3.5–5.2)
ALT: 34 U/L (ref 0–53)
AST: 30 U/L (ref 0–37)
Alkaline Phosphatase: 73 U/L (ref 39–117)
Bilirubin, Direct: 0 mg/dL (ref 0.0–0.3)
TOTAL PROTEIN: 7.8 g/dL (ref 6.0–8.3)
Total Bilirubin: 0.8 mg/dL (ref 0.2–1.2)

## 2014-04-04 MED ORDER — ATORVASTATIN CALCIUM 40 MG PO TABS: ORAL_TABLET | ORAL | Status: DC

## 2014-04-04 NOTE — Addendum Note (Signed)
Addended by: Ewing Schlein on: 04/04/2014 04:27 PM   Modules accepted: Orders

## 2014-04-08 ENCOUNTER — Ambulatory Visit: Payer: Federal, State, Local not specified - PPO | Admitting: Medical

## 2014-04-11 ENCOUNTER — Ambulatory Visit (INDEPENDENT_AMBULATORY_CARE_PROVIDER_SITE_OTHER): Payer: Federal, State, Local not specified - PPO | Admitting: Medical

## 2014-04-11 ENCOUNTER — Encounter: Payer: Self-pay | Admitting: Medical

## 2014-04-11 VITALS — BP 116/80 | HR 70 | Temp 98.1°F | Ht 70.0 in | Wt 229.6 lb

## 2014-04-11 DIAGNOSIS — R350 Frequency of micturition: Secondary | ICD-10-CM

## 2014-04-11 DIAGNOSIS — N410 Acute prostatitis: Secondary | ICD-10-CM

## 2014-04-11 LAB — POCT URINALYSIS DIPSTICK
Bilirubin, UA: NEGATIVE
Glucose, UA: NEGATIVE
Ketones, UA: NEGATIVE
Leukocytes, UA: NEGATIVE
NITRITE UA: NEGATIVE
PH UA: 5
RBC UA: NEGATIVE
Spec Grav, UA: 1.02
Urobilinogen, UA: 0.2

## 2014-04-11 MED ORDER — CIPROFLOXACIN HCL 500 MG PO TABS: 500.0000 mg | ORAL_TABLET | Freq: Two times a day (BID) | ORAL | Status: DC

## 2014-04-11 NOTE — Progress Notes (Signed)
Subjective:    Patient ID: Juan Velazquez, male    DOB: [birth year], 48 y.o.   MRN: [id number]  HPI   Pt in states that on Thursday night. He urinated 4 times at night. Pt does drink 3-4 bottle of water a day at work but not because thirsty but  for health purposes. No polyphagia. No fever or chills on Thursday. No perineum pain. Steady flow of urine on Thursday. No difficulty urinating. No hx of elevated blood sugars.  Pt dad may have prostate cancer per pt. Pt dad is 52 yo. Last 20 years may have had the cancer(type ??). Siblings no  prostate issues. Uncles no hx of prostate problems.  No dysuria when he was urinating.  Pt blood sugar in march was normal.   Past Medical History  Diagnosis Date  . Hyperlipidemia   . Allergy   . Asthma   . GERD (gastroesophageal reflux disease)   . Hyperplastic colonic polyp 09/01/2009  . Gastric polyp 08/06/2010    History   Social History  . Marital Status: Married    Spouse Name: N/A    Number of Children: 0  . Years of Education: N/A   Occupational History  . dept [employer] --w/s    Social History Main Topics  . Smoking status: Never Smoker   . Smokeless tobacco: Never Used  . Alcohol Use: Yes     Comment: rare  . Drug Use: No  . Sexual Activity:    Partners: Female   Other Topics Concern  . Not on file   Social History Narrative   Exercise-- 3x a week    Past Surgical History  Procedure Laterality Date  . Achilles tendon repair Right 10/2011    Family History  Problem Relation Age of Onset  . Diabetes Mother   . Hypertension Mother   . Kidney disease Mother   . Diabetes type II Brother   . Hypertension Brother   . Diabetes Brother   . Diabetes Sister   . Colon cancer Maternal Aunt   . Heart disease Father     defibrillator    No Known Allergies  Current Outpatient Prescriptions on File Prior to Visit  Medication Sig Dispense Refill  . atorvastatin (LIPITOR) 40 MG tablet 1 tab by mouth at bedtime 30 tablet  2  . levocetirizine (XYZAL) 5 MG tablet TAKE 1 TABLET BY MOUTH IN THE EVENING 30 tablet 6  . PROVENTIL HFA 108 (90 BASE) MCG/ACT inhaler INHALE 2 PUFFS INTO THE LUNGS EVERY 6 (SIX) HOURS AS NEEDED. 6.7 each 2   No current facility-administered medications on file prior to visit.    BP 116/80 mmHg  Pulse 70  Temp(Src) 98.1 F (36.7 C) (Oral)  Ht 5\' 10"  (1.778 m)  Wt 229 lb 9.6 oz (104.146 kg)  BMI 32.94 kg/m2  SpO2 97%     Review of Systems  Constitutional: Negative for fever, chills and fatigue.  Respiratory: Negative for cough, chest tightness, shortness of breath and wheezing.   Cardiovascular: Negative for chest pain and palpitations.  Gastrointestinal: Negative for nausea, vomiting, abdominal pain, diarrhea, constipation, blood in stool, abdominal distention, anal bleeding and rectal pain.  Endocrine: Negative for polydipsia and polyphagia.  Genitourinary: Positive for frequency. Negative for dysuria, urgency, hematuria, decreased urine volume, discharge, difficulty urinating, penile pain and testicular pain.       Frequent urination on past Thursday only. Then normal next day and no reoccurrence.  Musculoskeletal: Negative for back pain.  Neurological:  Negative for dizziness, weakness and headaches.  Hematological: Negative for adenopathy. Does not bruise/bleed easily.       Objective:   Physical Exam  Constitutional: He is oriented to person, place, and time. He appears well-developed and well-nourished.  HENT:  Head: Normocephalic and atraumatic.  Eyes: Conjunctivae and EOM are normal. Pupils are equal, round, and reactive to light.  Cardiovascular: Normal rate, regular rhythm and normal heart sounds.  Exam reveals no gallop and no friction rub.   No murmur heard. Pulmonary/Chest: Breath sounds normal. No respiratory distress. He has no wheezes. He has no rales. He exhibits no tenderness.  Abdominal: Soft. Bowel sounds are normal. He exhibits no distension and no  mass. There is no tenderness. There is no rebound and no guarding.  Genitourinary: Rectum normal, prostate normal and penis normal.  Testicle normal. Penis normal. No dc or lesions. Inguinal canals free from hernias.  On prostate exam smooth, not boggy, no enlarged. Hemoccult card negative for blood.  Neurological: He is alert and oriented to person, place, and time.  Skin: He is not diaphoretic.  Psychiatric: He has a normal mood and affect. His behavior is normal. Judgment and thought content normal.           Assessment & Plan:

## 2014-04-11 NOTE — Patient Instructions (Signed)
You had one day of very frequent urination. We will do a urine culture and psa today due to your dads history of prostate issues. I don't think you need antibiotic presently but if you developed symptoms again particularly over holiday then could start cipro.  Also with frequent urination we will check your blood sugar value(bmp) today.  Follow up in 7 days or as needed.

## 2014-04-11 NOTE — Progress Notes (Signed)
Pre visit review using our clinic review tool, if applicable. No additional management support is needed unless otherwise documented below in the visit note. 

## 2014-04-12 DIAGNOSIS — N419 Inflammatory disease of prostate, unspecified: Secondary | ICD-10-CM | POA: Insufficient documentation

## 2014-04-12 DIAGNOSIS — R350 Frequency of micturition: Secondary | ICD-10-CM | POA: Insufficient documentation

## 2014-04-12 LAB — BASIC METABOLIC PANEL
BUN: 17 mg/dL (ref 6–23)
CALCIUM: 9.6 mg/dL (ref 8.4–10.5)
CO2: 29 mEq/L (ref 19–32)
CREATININE: 1.4 mg/dL (ref 0.4–1.5)
Chloride: 103 mEq/L (ref 96–112)
GFR: 69.62 mL/min (ref >60.00)
Glucose, Bld: 85 mg/dL (ref 70–99)
Potassium: 4.8 mEq/L (ref 3.5–5.1)
Sodium: 140 mEq/L (ref 135–145)

## 2014-04-12 LAB — CULTURE, URINE COMPREHENSIVE
Colony Count: NO GROWTH
ORGANISM ID, BACTERIA: NO GROWTH

## 2014-04-12 LAB — PSA: PSA: 2.15 ng/mL (ref 0.10–4.00)

## 2014-04-12 NOTE — Assessment & Plan Note (Signed)
Pt  had one day of very frequent urination. We will do a urine culture and psa today due to his dads history of prostate issues(like cancer). I don't think he  need antibiotic presently but if he developed symptoms again particularly over holiday then could start cipro.  Also with frequent urination we will check your blood sugar value(bmp) today.  Follow up in 7 days or as needed.

## 2014-04-12 NOTE — Assessment & Plan Note (Signed)
Possible not definitive. Getting urine culture and psa. If during the interim urinary symptoms reoccur then start cipro. Made available if symptomatic again over thanskgiving holidays.

## 2014-04-14 ENCOUNTER — Telehealth: Payer: Self-pay | Admitting: Medical

## 2014-04-14 DIAGNOSIS — N410 Acute prostatitis: Secondary | ICD-10-CM

## 2014-04-14 NOTE — Telephone Encounter (Signed)
Patient's PSA was elevated from 8 months ago. Based on his recent symptoms I did send a note to Santiago Glad LPN who works with me. She will call the patient and advised patient regarding this PSA elevation and advise him to start Cipro antibiotic. The day after he finishes antibiotic while his PSA to be repeated. Depending on the PSA value I will decide whether I will refer him to a urologist. Due to the fact that patient's dad does have prostate cancer I will proceed with caution.

## 2014-04-18 ENCOUNTER — Telehealth: Payer: Self-pay | Admitting: Family Medicine

## 2014-04-18 ENCOUNTER — Other Ambulatory Visit: Payer: Self-pay

## 2014-04-18 MED ORDER — CIPROFLOXACIN HCL 500 MG PO TABS: 500.0000 mg | ORAL_TABLET | Freq: Two times a day (BID) | ORAL | Status: DC

## 2014-04-18 NOTE — Telephone Encounter (Signed)
Patient was in 11-23 and did labs  He needs the results

## 2014-04-18 NOTE — Telephone Encounter (Signed)
Patient notified of PSA results

## 2014-04-18 NOTE — Telephone Encounter (Signed)
Called patient with lab results.  

## 2014-05-09 ENCOUNTER — Encounter: Payer: Self-pay | Admitting: Cardiology

## 2014-06-08 ENCOUNTER — Telehealth: Payer: Self-pay | Admitting: Family Medicine

## 2014-06-08 NOTE — Telephone Encounter (Signed)
To Heywood Footman

## 2014-06-08 NOTE — Telephone Encounter (Signed)
Caller name:Cherian, Alexey Rhoads Relation to EH:UDJS  Call back [place]: [id number] - [place], [place] [phone] (Phone)    Reason for call:  Pt states levocetirizine (XYZAL) 5 MG tablet needs prior-auth

## 2014-06-28 NOTE — Telephone Encounter (Signed)
Received notification from [facility] FEP that no prior authorization is needed. Faxed pharmacy. [email or web address]//[email or web address]

## 2014-06-30 ENCOUNTER — Other Ambulatory Visit: Payer: Self-pay

## 2014-06-30 MED ORDER — ATORVASTATIN CALCIUM 40 MG PO TABS: ORAL_TABLET | ORAL | Status: DC

## 2014-07-25 ENCOUNTER — Telehealth: Payer: Self-pay

## 2014-07-25 NOTE — Telephone Encounter (Signed)
Medication: Review, verify sig & reconcile(including outside meds): yes Duplicates discarded: n/a DM supply source: n/a  Local pharmacy: [id number] - [place], [place] - [address]  Allergies verified: yes  Immunization Status: Prompted for insurance verification: n/a Flu vaccine--declined Tdap--10/02/05   A/P:   Changes to FH, PSH or Personal Hx: Reviewed. No changes. PSA:  04/11/14---2.15  Care Teams Updated: ED/Hospital/Urgent Care Visits: no Prompted for: Updated insurance, contact information, forms: yes Remind to bring: DPR information, advance directives: yes  To Discuss with Provider:  Nothing at this time.

## 2014-07-26 ENCOUNTER — Encounter: Payer: Self-pay | Admitting: Family Medicine

## 2014-07-26 ENCOUNTER — Ambulatory Visit (INDEPENDENT_AMBULATORY_CARE_PROVIDER_SITE_OTHER): Payer: Federal, State, Local not specified - PPO | Admitting: Family Medicine

## 2014-07-26 VITALS — BP 106/72 | HR 66 | Temp 98.0°F | Ht 70.0 in | Wt 224.8 lb

## 2014-07-26 DIAGNOSIS — Z0000 Encounter for general adult medical examination without abnormal findings: Secondary | ICD-10-CM

## 2014-07-26 DIAGNOSIS — E785 Hyperlipidemia, unspecified: Secondary | ICD-10-CM

## 2014-07-26 LAB — BASIC METABOLIC PANEL
BUN: 20 mg/dL (ref 6–23)
CHLORIDE: 107 meq/L (ref 96–112)
CO2: 27 meq/L (ref 19–32)
Calcium: 9.5 mg/dL (ref 8.4–10.5)
Creatinine, Ser: 1.23 mg/dL (ref 0.40–1.50)
GFR: 80.74 mL/min (ref >60.00)
Glucose, Bld: 95 mg/dL (ref 70–99)
Potassium: 3.8 mEq/L (ref 3.5–5.1)
Sodium: 138 mEq/L (ref 135–145)

## 2014-07-26 LAB — CBC WITH DIFFERENTIAL/PLATELET
BASOS ABS: 0 10*3/uL (ref 0.0–0.1)
Basophils Relative: 0.6 % (ref 0.0–3.0)
EOS ABS: 0.1 10*3/uL (ref 0.0–0.7)
Eosinophils Relative: 2.6 % (ref 0.0–5.0)
HEMATOCRIT: 45.3 % (ref 39.0–52.0)
Hemoglobin: 15.1 g/dL (ref 13.0–17.0)
LYMPHS ABS: 1.6 10*3/uL (ref 0.7–4.0)
Lymphocytes Relative: 38.7 % (ref 12.0–46.0)
MCHC: 33.3 g/dL (ref 30.0–36.0)
MCV: 88.2 fl (ref 78.0–100.0)
Monocytes Absolute: 0.4 10*3/uL (ref 0.1–1.0)
Monocytes Relative: 10.7 % (ref 3.0–12.0)
Neutro Abs: 1.9 10*3/uL (ref 1.4–7.7)
Neutrophils Relative %: 47.4 % (ref 43.0–77.0)
PLATELETS: 205 10*3/uL (ref 150.0–400.0)
RBC: 5.14 Mil/uL (ref 4.22–5.81)
RDW: 13.1 % (ref 11.5–15.5)
WBC: 4.1 10*3/uL (ref 4.0–10.5)

## 2014-07-26 LAB — HEPATIC FUNCTION PANEL
ALBUMIN: 4.6 g/dL (ref 3.5–5.2)
ALT: 60 U/L — ABNORMAL HIGH (ref 0–53)
AST: 41 U/L — AB (ref 0–37)
Alkaline Phosphatase: 75 U/L (ref 39–117)
Bilirubin, Direct: 0.2 mg/dL (ref 0.0–0.3)
TOTAL PROTEIN: 7.4 g/dL (ref 6.0–8.3)
Total Bilirubin: 0.9 mg/dL (ref 0.2–1.2)

## 2014-07-26 LAB — POCT URINALYSIS DIPSTICK
Bilirubin, UA: NEGATIVE
Blood, UA: NEGATIVE
Glucose, UA: NEGATIVE
Ketones, UA: NEGATIVE
LEUKOCYTES UA: NEGATIVE
NITRITE UA: NEGATIVE
PROTEIN UA: NEGATIVE
Spec Grav, UA: >=1.03
Urobilinogen, UA: NEGATIVE
pH, UA: 5.5

## 2014-07-26 LAB — LIPID PANEL
CHOLESTEROL: 127 mg/dL (ref 0–200)
HDL: 50.8 mg/dL (ref >39.00)
LDL Cholesterol: 61 mg/dL (ref 0–99)
NonHDL: 76.2
Total CHOL/HDL Ratio: 3
Triglycerides: 76 mg/dL (ref 0.0–149.0)
VLDL: 15.2 mg/dL (ref 0.0–40.0)

## 2014-07-26 LAB — MICROALBUMIN / CREATININE URINE RATIO
CREATININE, U: 136.3 mg/dL
MICROALB/CREAT RATIO: 0.5 mg/g (ref 0.0–30.0)

## 2014-07-26 LAB — TSH: TSH: 1.72 u[IU]/mL (ref 0.35–4.50)

## 2014-07-26 LAB — PSA: PSA: 1.89 ng/mL (ref 0.10–4.00)

## 2014-07-26 NOTE — Progress Notes (Signed)
Pre visit review using our clinic review tool, if applicable. No additional management support is needed unless otherwise documented below in the visit note. 

## 2014-07-26 NOTE — Progress Notes (Signed)
Patient ID: Juan Velazquez, male    DOB: 1965-09-20  Age: 49 y.o. MRN: [id number]    Subjective:  Subjective HPI Juan Velazquez presents for cpe  Review of Systems  Constitutional: Negative.   HENT: Negative for congestion, ear pain, hearing loss, nosebleeds, postnasal drip, rhinorrhea, sinus pressure, sneezing and tinnitus.   Eyes: Negative for photophobia, discharge, itching and visual disturbance.  Respiratory: Negative.   Cardiovascular: Negative.   Gastrointestinal: Negative for abdominal pain, constipation, blood in stool, abdominal distention and anal bleeding.  Endocrine: Negative.   Genitourinary: Negative.   Musculoskeletal: Negative.   Skin: Negative.   Allergic/Immunologic: Negative.   Neurological: Negative for dizziness, weakness, light-headedness, numbness and headaches.  Psychiatric/Behavioral: Negative for suicidal ideas, confusion, sleep disturbance, dysphoric mood, decreased concentration and agitation. The patient is not nervous/anxious.     History Past Medical History  Diagnosis Date  . Hyperlipidemia   . Allergy   . Asthma   . GERD (gastroesophageal reflux disease)   . Hyperplastic colonic polyp 09/01/2009  . Gastric polyp 08/06/2010    He has past surgical history that includes Achilles tendon repair (Right, 10/2011).   His family history includes Colon cancer in his maternal aunt; Diabetes in his brother, mother, and sister; Diabetes type II in his brother; Heart disease in his father; Hypertension in his brother and mother; Kidney disease in his mother.He reports that he has never smoked. He has never used smokeless tobacco. He reports that he drinks alcohol. He reports that he does not use illicit drugs.  Current Outpatient Prescriptions on File Prior to Visit  Medication Sig Dispense Refill  . atorvastatin (LIPITOR) 40 MG tablet 1 tab by mouth at bedtime--repeat labs are due now 30 tablet 0  . levocetirizine (XYZAL) 5 MG tablet TAKE 1  TABLET BY MOUTH IN THE EVENING 30 tablet 6  . LOTEMAX 0.5 % ophthalmic suspension   0  . PROVENTIL HFA 108 (90 BASE) MCG/ACT inhaler INHALE 2 PUFFS INTO THE LUNGS EVERY 6 (SIX) HOURS AS NEEDED. 6.7 each 2   No current facility-administered medications on file prior to visit.     Objective:  Objective Physical Exam  Constitutional: He is oriented to person, place, and time. He appears well-developed and well-nourished. No distress.  HENT:  Head: Normocephalic and atraumatic.  Right Ear: External ear normal.  Left Ear: External ear normal.  Nose: Nose normal.  Mouth/Throat: Oropharynx is clear and moist. No oropharyngeal exudate.  Eyes: Conjunctivae and EOM are normal. Pupils are equal, round, and reactive to light. Right eye exhibits no discharge. Left eye exhibits no discharge.  Neck: Normal range of motion. Neck supple. No JVD present. No thyromegaly present.  Cardiovascular: Normal rate, regular rhythm and intact distal pulses.  Exam reveals no gallop and no friction rub.   No murmur heard. Pulmonary/Chest: Effort normal and breath sounds normal. No respiratory distress. He has no wheezes. He has no rales. He exhibits no tenderness.  Abdominal: Soft. Bowel sounds are normal. He exhibits no distension and no mass. There is no tenderness. There is no rebound and no guarding.  Genitourinary: Rectum normal, prostate normal and penis normal. Guaiac negative stool.  Musculoskeletal: Normal range of motion. He exhibits no edema or tenderness.  Lymphadenopathy:    He has no cervical adenopathy.  Neurological: He is alert and oriented to person, place, and time. He displays normal reflexes. He exhibits normal muscle tone.  Skin: Skin is warm and dry. No rash noted. He is not diaphoretic.  No erythema. No pallor.  Psychiatric: He has a normal mood and affect. His behavior is normal. Judgment and thought content normal.   BP 106/72 mmHg  Pulse 66  Temp(Src) 98 F (36.7 C) (Oral)  Ht 5\' 10"   (1.778 m)  Wt 224 lb 12.8 oz (101.969 kg)  BMI 32.26 kg/m2  SpO2 98% Wt Readings from Last 3 Encounters:  07/26/14 224 lb 12.8 oz (101.969 kg)  04/11/14 229 lb 9.6 oz (104.146 kg)  09/23/13 225 lb (102.059 kg)     Lab Results  Component Value Date   WBC 3.1* 07/22/2013   HGB 13.7 07/22/2013   HCT 41.9 07/22/2013   PLT 199.0 07/22/2013   GLUCOSE 85 04/11/2014   CHOL 214* 04/04/2014   TRIG 83.0 04/04/2014   HDL 46.60 04/04/2014   LDLDIRECT 133.7 07/01/2012   LDLCALC 151* 04/04/2014   ALT 34 04/04/2014   AST 30 04/04/2014   NA 140 04/11/2014   K 4.8 04/11/2014   CL 103 04/11/2014   CREATININE 1.4 04/11/2014   BUN 17 04/11/2014   CO2 29 04/11/2014   TSH 2.13 07/22/2013   PSA 2.15 04/11/2014    Dg Chest 2 View  08/13/2010   *RADIOLOGY REPORT*  Clinical Data: Cough and congestion.  CHEST - 2 VIEW  Comparison: Chest 06/15/2010.  Findings: Lungs are clear.  Heart size is normal.  No pneumothorax or pleural effusion.  IMPRESSION: Negative chest.  Original Report Authenticated By: [id number]    Assessment & Plan:  Plan I am having Juan Velazquez maintain his levocetirizine, PROVENTIL HFA, atorvastatin, and LOTEMAX.  No orders of the defined types were placed in this encounter.    Problem List Items Addressed This Visit    None    Visit Diagnoses    Preventative health care    -  Primary    Relevant Orders    Basic metabolic panel    CBC with Differential/Platelet    Hepatic function panel    Lipid panel    Microalbumin / creatinine urine ratio    POCT urinalysis dipstick    TSH    PSA    Hyperlipidemia        Relevant Orders    Hepatic function panel    Lipid panel       Follow-up: Return in about 6 months (around 01/26/2015), or if symptoms worsen or fail to improve, for labs.  Garnet Koyanagi, DO

## 2014-07-26 NOTE — Assessment & Plan Note (Signed)
Cont lipitor. Check labs.

## 2014-07-26 NOTE — Patient Instructions (Signed)
Preventive Care for Adults A healthy lifestyle and preventive care can promote health and wellness. Preventive health guidelines for men include the following key practices:  A routine yearly physical is a good way to check with your health care provider about your health and preventative screening. It is a chance to share any concerns and updates on your health and to receive a thorough exam.  Visit your dentist for a routine exam and preventative care every 6 months. Brush your teeth twice a day and floss once a day. Good oral hygiene prevents tooth decay and gum disease.  The frequency of eye exams is based on your age, health, family medical history, use of contact lenses, and other factors. Follow your health care provider's recommendations for frequency of eye exams.  Eat a healthy diet. Foods such as vegetables, fruits, whole grains, low-fat dairy products, and lean protein foods contain the nutrients you need without too many calories. Decrease your intake of foods high in solid fats, added sugars, and salt. Eat the right amount of calories for you.Get information about a proper diet from your health care provider, if necessary.  Regular physical exercise is one of the most important things you can do for your health. Most adults should get at least 150 minutes of moderate-intensity exercise (any activity that increases your heart rate and causes you to sweat) each week. In addition, most adults need muscle-strengthening exercises on 2 or more days a week.  Maintain a healthy weight. The body mass index (BMI) is a screening tool to identify possible weight problems. It provides an estimate of body fat based on height and weight. Your health care provider can find your BMI and can help you achieve or maintain a healthy weight.For adults 20 years and older:  A BMI below 18.5 is considered underweight.  A BMI of 18.5 to 24.9 is normal.  A BMI of 25 to 29.9 is considered overweight.  A BMI  of 30 and above is considered obese.  Maintain normal blood lipids and cholesterol levels by exercising and minimizing your intake of saturated fat. Eat a balanced diet with plenty of fruit and vegetables. Blood tests for lipids and cholesterol should begin at age 50 and be repeated every 5 years. If your lipid or cholesterol levels are high, you are over 50, or you are at high risk for heart disease, you may need your cholesterol levels checked more frequently.Ongoing high lipid and cholesterol levels should be treated with medicines if diet and exercise are not working.  If you smoke, find out from your health care provider how to quit. If you do not use tobacco, do not start.  Lung cancer screening is recommended for adults aged 73-80 years who are at high risk for developing lung cancer because of a history of smoking. A yearly low-dose CT scan of the lungs is recommended for people who have at least a 30-pack-year history of smoking and are a current smoker or have quit within the past 15 years. A pack year of smoking is smoking an average of 1 pack of cigarettes a day for 1 year (for example: 1 pack a day for 30 years or 2 packs a day for 15 years). Yearly screening should continue until the smoker has stopped smoking for at least 15 years. Yearly screening should be stopped for people who develop a health problem that would prevent them from having lung cancer treatment.  If you choose to drink alcohol, do not have more than  2 drinks per day. One drink is considered to be 12 ounces (355 mL) of beer, 5 ounces (148 mL) of wine, or 1.5 ounces (44 mL) of liquor.  Avoid use of street drugs. Do not share needles with anyone. Ask for help if you need support or instructions about stopping the use of drugs.  High blood pressure causes heart disease and increases the risk of stroke. Your blood pressure should be checked at least every 1-2 years. Ongoing high blood pressure should be treated with  medicines, if weight loss and exercise are not effective.  If you are 45-79 years old, ask your health care provider if you should take aspirin to prevent heart disease.  Diabetes screening involves taking a blood sample to check your fasting blood sugar level. This should be done once every 3 years, after age 45, if you are within normal weight and without risk factors for diabetes. Testing should be considered at a younger age or be carried out more frequently if you are overweight and have at least 1 risk factor for diabetes.  Colorectal cancer can be detected and often prevented. Most routine colorectal cancer screening begins at the age of 50 and continues through age 75. However, your health care provider may recommend screening at an earlier age if you have risk factors for colon cancer. On a yearly basis, your health care provider may provide home test kits to check for hidden blood in the stool. Use of a small camera at the end of a tube to directly examine the colon (sigmoidoscopy or colonoscopy) can detect the earliest forms of colorectal cancer. Talk to your health care provider about this at age 50, when routine screening begins. Direct exam of the colon should be repeated every 5-10 years through age 75, unless early forms of precancerous polyps or small growths are found.  People who are at an increased risk for hepatitis B should be screened for this virus. You are considered at high risk for hepatitis B if:  You were born in a country where hepatitis B occurs often. Talk with your health care provider about which countries are considered high risk.  Your parents were born in a high-risk country and you have not received a shot to protect against hepatitis B (hepatitis B vaccine).  You have HIV or AIDS.  You use needles to inject street drugs.  You live with, or have sex with, someone who has hepatitis B.  You are a man who has sex with other men (MSM).  You get hemodialysis  treatment.  You take certain medicines for conditions such as cancer, organ transplantation, and autoimmune conditions.  Hepatitis C blood testing is recommended for all people born from 1945 through 1965 and any individual with known risks for hepatitis C.  Practice safe sex. Use condoms and avoid high-risk sexual practices to reduce the spread of sexually transmitted infections (STIs). STIs include gonorrhea, chlamydia, syphilis, trichomonas, herpes, HPV, and human immunodeficiency virus (HIV). Herpes, HIV, and HPV are viral illnesses that have no cure. They can result in disability, cancer, and death.  If you are at risk of being infected with HIV, it is recommended that you take a prescription medicine daily to prevent HIV infection. This is called preexposure prophylaxis (PrEP). You are considered at risk if:  You are a man who has sex with other men (MSM) and have other risk factors.  You are a heterosexual man, are sexually active, and are at increased risk for HIV infection.    You take drugs by injection.  You are sexually active with a partner who has HIV.  Talk with your health care provider about whether you are at high risk of being infected with HIV. If you choose to begin PrEP, you should first be tested for HIV. You should then be tested every 3 months for as long as you are taking PrEP.  A one-time screening for abdominal aortic aneurysm (AAA) and surgical repair of large AAAs by ultrasound are recommended for men ages 90 to 15 years who are current or former smokers.  Healthy men should no longer receive prostate-specific antigen (PSA) blood tests as part of routine cancer screening. Talk with your health care provider about prostate cancer screening.  Testicular cancer screening is not recommended for adult males who have no symptoms. Screening includes self-exam, a health care provider exam, and other screening tests. Consult with your health care provider about any symptoms  you have or any concerns you have about testicular cancer.  Use sunscreen. Apply sunscreen liberally and repeatedly throughout the day. You should seek shade when your shadow is shorter than you. Protect yourself by wearing long sleeves, pants, a wide-brimmed hat, and sunglasses year round, whenever you are outdoors.  Once a month, do a whole-body skin exam, using a mirror to look at the skin on your back. Tell your health care provider about new moles, moles that have irregular borders, moles that are larger than a pencil eraser, or moles that have changed in shape or color.  Stay current with required vaccines (immunizations).  Influenza vaccine. All adults should be immunized every year.  Tetanus, diphtheria, and acellular pertussis (Td, Tdap) vaccine. An adult who has not previously received Tdap or who does not know his vaccine status should receive 1 dose of Tdap. This initial dose should be followed by tetanus and diphtheria toxoids (Td) booster doses every 10 years. Adults with an unknown or incomplete history of completing a 3-dose immunization series with Td-containing vaccines should begin or complete a primary immunization series including a Tdap dose. Adults should receive a Td booster every 10 years.  Varicella vaccine. An adult without evidence of immunity to varicella should receive 2 doses or a second dose if he has previously received 1 dose.  Human papillomavirus (HPV) vaccine. Males aged 15-21 years who have not received the vaccine previously should receive the 3-dose series. Males aged 22-26 years may be immunized. Immunization is recommended through the age of 23 years for any male who has sex with males and did not get any or all doses earlier. Immunization is recommended for any person with an immunocompromised condition through the age of 26 years if he did not get any or all doses earlier. During the 3-dose series, the second dose should be obtained 4-8 weeks after the first  dose. The third dose should be obtained 24 weeks after the first dose and 16 weeks after the second dose.  Zoster vaccine. One dose is recommended for adults aged 25 years or older unless certain conditions are present.  Measles, mumps, and rubella (MMR) vaccine. Adults born before [birth year] generally are considered immune to measles and mumps. Adults born in [birth year] or later should have 1 or more doses of MMR vaccine unless there is a contraindication to the vaccine or there is laboratory evidence of immunity to each of the three diseases. A routine second dose of MMR vaccine should be obtained at least 28 days after the first dose for students attending postsecondary  schools, health care workers, or international travelers. People who received inactivated measles vaccine or an unknown type of measles vaccine during 1963-1967 should receive 2 doses of MMR vaccine. People who received inactivated mumps vaccine or an unknown type of mumps vaccine before 1979 and are at high risk for mumps infection should consider immunization with 2 doses of MMR vaccine. Unvaccinated health care workers born before [birth year] who lack laboratory evidence of measles, mumps, or rubella immunity or laboratory confirmation of disease should consider measles and mumps immunization with 2 doses of MMR vaccine or rubella immunization with 1 dose of MMR vaccine.  Pneumococcal 13-valent conjugate (PCV13) vaccine. When indicated, a person who is uncertain of his immunization history and has no record of immunization should receive the PCV13 vaccine. An adult aged 8 years or older who has certain medical conditions and has not been previously immunized should receive 1 dose of PCV13 vaccine. This PCV13 should be followed with a dose of pneumococcal polysaccharide (PPSV23) vaccine. The PPSV23 vaccine dose should be obtained at least 8 weeks after the dose of PCV13 vaccine. An adult aged 69 years or older who has certain medical conditions and  previously received 1 or more doses of PPSV23 vaccine should receive 1 dose of PCV13. The PCV13 vaccine dose should be obtained 1 or more years after the last PPSV23 vaccine dose.  Pneumococcal polysaccharide (PPSV23) vaccine. When PCV13 is also indicated, PCV13 should be obtained first. All adults aged 46 years and older should be immunized. An adult younger than age 11 years who has certain medical conditions should be immunized. Any person who resides in a nursing home or long-term care facility should be immunized. An adult smoker should be immunized. People with an immunocompromised condition and certain other conditions should receive both PCV13 and PPSV23 vaccines. People with human immunodeficiency virus (HIV) infection should be immunized as soon as possible after diagnosis. Immunization during chemotherapy or radiation therapy should be avoided. Routine use of PPSV23 vaccine is not recommended for American Indians, [place] Natives, or people younger than 65 years unless there are medical conditions that require PPSV23 vaccine. When indicated, people who have unknown immunization and have no record of immunization should receive PPSV23 vaccine. One-time revaccination 5 years after the first dose of PPSV23 is recommended for people aged 19-64 years who have chronic kidney failure, nephrotic syndrome, asplenia, or immunocompromised conditions. People who received 1-2 doses of PPSV23 before age 26 years should receive another dose of PPSV23 vaccine at age 93 years or later if at least 5 years have passed since the previous dose. Doses of PPSV23 are not needed for people immunized with PPSV23 at or after age 52 years.  Meningococcal vaccine. Adults with asplenia or persistent complement component deficiencies should receive 2 doses of quadrivalent meningococcal conjugate (MenACWY-D) vaccine. The doses should be obtained at least 2 months apart. Microbiologists working with certain meningococcal bacteria,  [employer], people at risk during an outbreak, and people who travel to or live in countries with a high rate of meningitis should be immunized. A first-year college student up through age 23 years who is living in a residence hall should receive a dose if he did not receive a dose on or after his 16th birthday. Adults who have certain high-risk conditions should receive one or more doses of vaccine.  Hepatitis A vaccine. Adults who wish to be protected from this disease, have certain high-risk conditions, work with hepatitis A-infected animals, work in hepatitis A research labs, or  travel to or work in countries with a high rate of hepatitis A should be immunized. Adults who were previously unvaccinated and who anticipate close contact with an international adoptee during the first 60 days after arrival in the [place] from a country with a high rate of hepatitis A should be immunized.  Hepatitis B vaccine. Adults should be immunized if they wish to be protected from this disease, have certain high-risk conditions, may be exposed to blood or other infectious body fluids, are household contacts or sex partners of hepatitis B positive people, are clients or workers in certain care facilities, or travel to or work in countries with a high rate of hepatitis B.  Haemophilus influenzae type b (Hib) vaccine. A previously unvaccinated person with asplenia or sickle cell disease or having a scheduled splenectomy should receive 1 dose of Hib vaccine. Regardless of previous immunization, a recipient of a hematopoietic stem cell transplant should receive a 3-dose series 6-12 months after his successful transplant. Hib vaccine is not recommended for adults with HIV infection. Preventive Service / Frequency Ages 84 to 31  Blood pressure check.** / Every 1 to 2 years.  Lipid and cholesterol check.** / Every 5 years beginning at age 18.  Hepatitis C blood test.** / For any individual with known risks for  hepatitis C.  Skin self-exam. / Monthly.  Influenza vaccine. / Every year.  Tetanus, diphtheria, and acellular pertussis (Tdap, Td) vaccine.** / Consult your health care provider. 1 dose of Td every 10 years.  Varicella vaccine.** / Consult your health care provider.  HPV vaccine. / 3 doses over 6 months, if 31 or younger.  Measles, mumps, rubella (MMR) vaccine.** / You need at least 1 dose of MMR if you were born in 1957 or later. You may also need a second dose.  Pneumococcal 13-valent conjugate (PCV13) vaccine.** / Consult your health care provider.  Pneumococcal polysaccharide (PPSV23) vaccine.** / 1 to 2 doses if you smoke cigarettes or if you have certain conditions.  Meningococcal vaccine.** / 1 dose if you are age 47 to 12 years and a Market researcher living in a residence hall, or have one of several medical conditions. You may also need additional booster doses.  Hepatitis A vaccine.** / Consult your health care provider.  Hepatitis B vaccine.** / Consult your health care provider.  Haemophilus influenzae type b (Hib) vaccine.** / Consult your health care provider. Ages 21 to 58  Blood pressure check.** / Every 1 to 2 years.  Lipid and cholesterol check.** / Every 5 years beginning at age 71.  Lung cancer screening. / Every year if you are aged 17-80 years and have a 30-pack-year history of smoking and currently smoke or have quit within the past 15 years. Yearly screening is stopped once you have quit smoking for at least 15 years or develop a health problem that would prevent you from having lung cancer treatment.  Fecal occult blood test (FOBT) of stool. / Every year beginning at age 55 and continuing until age 47. You may not have to do this test if you get a colonoscopy every 10 years.  Flexible sigmoidoscopy** or colonoscopy.** / Every 5 years for a flexible sigmoidoscopy or every 10 years for a colonoscopy beginning at age 101 and continuing until age  52.  Hepatitis C blood test.** / For all people born from [birth year] through 1965 and any individual with known risks for hepatitis C.  Skin self-exam. / Monthly.  Influenza vaccine. / Every  year.  Tetanus, diphtheria, and acellular pertussis (Tdap/Td) vaccine.** / Consult your health care provider. 1 dose of Td every 10 years.  Varicella vaccine.** / Consult your health care provider.  Zoster vaccine.** / 1 dose for adults aged 53 years or older.  Measles, mumps, rubella (MMR) vaccine.** / You need at least 1 dose of MMR if you were born in 1957 or later. You may also need a second dose.  Pneumococcal 13-valent conjugate (PCV13) vaccine.** / Consult your health care provider.  Pneumococcal polysaccharide (PPSV23) vaccine.** / 1 to 2 doses if you smoke cigarettes or if you have certain conditions.  Meningococcal vaccine.** / Consult your health care provider.  Hepatitis A vaccine.** / Consult your health care provider.  Hepatitis B vaccine.** / Consult your health care provider.  Haemophilus influenzae type b (Hib) vaccine.** / Consult your health care provider. Ages 77 and over  Blood pressure check.** / Every 1 to 2 years.  Lipid and cholesterol check.**/ Every 5 years beginning at age 85.  Lung cancer screening. / Every year if you are aged 55-80 years and have a 30-pack-year history of smoking and currently smoke or have quit within the past 15 years. Yearly screening is stopped once you have quit smoking for at least 15 years or develop a health problem that would prevent you from having lung cancer treatment.  Fecal occult blood test (FOBT) of stool. / Every year beginning at age 33 and continuing until age 11. You may not have to do this test if you get a colonoscopy every 10 years.  Flexible sigmoidoscopy** or colonoscopy.** / Every 5 years for a flexible sigmoidoscopy or every 10 years for a colonoscopy beginning at age 28 and continuing until age 73.  Hepatitis C blood  test.** / For all people born from [birth year] through 1965 and any individual with known risks for hepatitis C.  Abdominal aortic aneurysm (AAA) screening.** / A one-time screening for ages 50 to 27 years who are current or former smokers.  Skin self-exam. / Monthly.  Influenza vaccine. / Every year.  Tetanus, diphtheria, and acellular pertussis (Tdap/Td) vaccine.** / 1 dose of Td every 10 years.  Varicella vaccine.** / Consult your health care provider.  Zoster vaccine.** / 1 dose for adults aged 34 years or older.  Pneumococcal 13-valent conjugate (PCV13) vaccine.** / Consult your health care provider.  Pneumococcal polysaccharide (PPSV23) vaccine.** / 1 dose for all adults aged 63 years and older.  Meningococcal vaccine.** / Consult your health care provider.  Hepatitis A vaccine.** / Consult your health care provider.  Hepatitis B vaccine.** / Consult your health care provider.  Haemophilus influenzae type b (Hib) vaccine.** / Consult your health care provider. **Family history and personal history of risk and conditions may change your health care provider's recommendations. Document Released: 07/02/2001 Document Revised: 05/11/2013 Document Reviewed: 10/01/2010 [facility] Patient Information 2015 [place], [place]. This information is not intended to replace advice given to you by your health care provider. Make sure you discuss any questions you have with your health care provider.

## 2014-07-26 NOTE — Assessment & Plan Note (Signed)
ghm utd Pt refused flu shot and HIV Check labs

## 2014-08-01 ENCOUNTER — Other Ambulatory Visit: Payer: Self-pay | Admitting: Family Medicine

## 2014-11-07 ENCOUNTER — Ambulatory Visit (INDEPENDENT_AMBULATORY_CARE_PROVIDER_SITE_OTHER): Payer: Federal, State, Local not specified - PPO | Admitting: Family Medicine

## 2014-11-07 ENCOUNTER — Encounter: Payer: Self-pay | Admitting: Family Medicine

## 2014-11-07 VITALS — BP 118/72 | HR 52 | Ht 69.0 in | Wt 215.0 lb

## 2014-11-07 DIAGNOSIS — M545 Low back pain, unspecified: Secondary | ICD-10-CM

## 2014-11-07 DIAGNOSIS — M25511 Pain in right shoulder: Secondary | ICD-10-CM | POA: Diagnosis not present

## 2014-11-07 NOTE — Patient Instructions (Signed)
You have lumbar degenerative disc disease Take tylenol for baseline pain relief as you have been (1-2 extra strength tabs 3x/day) Aleve 2 tabs twice a day with food for pain and inflammation (stop if this irritates your stomach). Glucosamine 750mg  twice a day may be helpful. Topical medications up to four times a day are also potentially helpful (like capsaicin, biofreeze). Stay as active as possible. Physical therapy has been shown to be helpful as well. Strengthening of low back muscles, abdominal musculature are key for long term pain relief - do these daily for the next 6 weeks. Follow up with me in 5-6 weeks.  You have rotator cuff impingement Try to avoid painful activities (overhead activities, lifting with extended arm) as much as possible. Aleve as noted above. Can take tylenol in addition to this. Subacromial injection may be beneficial to help with pain and to decrease inflammation. Consider physical therapy with transition to home exercise program. Do home exercise program with theraband and scapular stabilization exercises daily - these are very important for long term relief. If not improving at follow-up we will consider further imaging, injection, physical therapy, and/or nitro patches.

## 2014-11-10 DIAGNOSIS — M545 Low back pain, unspecified: Secondary | ICD-10-CM | POA: Insufficient documentation

## 2014-11-10 DIAGNOSIS — M25511 Pain in right shoulder: Secondary | ICD-10-CM | POA: Insufficient documentation

## 2014-11-10 NOTE — Progress Notes (Signed)
PCP: Garnet Koyanagi, DO  Subjective:   HPI: Patient is a 49 y.o. male here for right shoulder, low back pain.  Patient denies known injury or trauma. Remembers doing lateral raises with free weights prior to shoulder hurting but again no injury. Pain started about 3 weeks ago after this. No bruising, swelling. Nor hurts to reach across, go overhead, take shirt off. Pain up to 8/10 level at times. Has been stretching, taking tylenol. Also with low back pain up to 7/10 in morning, eases as day goes on. Can radiate into both hips. No numbness/tingling. No bowel/bladder dysfunction. Tried icing, tylenol.  Past Medical History  Diagnosis Date  . Hyperlipidemia   . Allergy   . Asthma   . GERD (gastroesophageal reflux disease)   . Hyperplastic colonic polyp 09/01/2009  . Gastric polyp 08/06/2010    Current Outpatient Prescriptions on File Prior to Visit  Medication Sig Dispense Refill  . atorvastatin (LIPITOR) 40 MG tablet Take 1 tablet (40 mg total) by mouth at bedtime. 30 tablet 5  . levocetirizine (XYZAL) 5 MG tablet TAKE 1 TABLET BY MOUTH IN THE EVENING 30 tablet 6  . LOTEMAX 0.5 % ophthalmic suspension   0  . PROVENTIL HFA 108 (90 BASE) MCG/ACT inhaler INHALE 2 PUFFS INTO THE LUNGS EVERY 6 (SIX) HOURS AS NEEDED. 6.7 each 2   No current facility-administered medications on file prior to visit.    Past Surgical History  Procedure Laterality Date  . Achilles tendon repair Right 10/2011    No Known Allergies  History   Social History  . Marital Status: Married    Spouse Name: N/A  . Number of Children: 0  . Years of Education: N/A   Occupational History  . dept [employer] --w/s    Social History Main Topics  . Smoking status: Never Smoker   . Smokeless tobacco: Never Used  . Alcohol Use: 0.0 oz/week    0 Standard drinks or equivalent per week     Comment: rare  . Drug Use: No  . Sexual Activity:    Partners: Female   Other Topics Concern  . Not on file   Social  History Narrative   Exercise-- 3x a week    Family History  Problem Relation Age of Onset  . Diabetes Mother   . Hypertension Mother   . Kidney disease Mother   . Diabetes type II Brother   . Hypertension Brother   . Diabetes Brother   . Diabetes Sister   . Colon cancer Maternal Aunt   . Heart disease Father     defibrillator    BP 118/72 mmHg  Pulse 52  Ht 5\' 9"  (1.753 m)  Wt 215 lb (97.523 kg)  BMI 31.74 kg/m2  Review of Systems: See HPI above.    Objective:  Physical Exam:  Gen: NAD  Right shoulder: No swelling, ecchymoses.  No gross deformity. No TTP. FROM with painful arc.Marland Kitchen Positive Hawkins, Neers. Negative Speeds, Yergasons. Strength 5/5 with empty can and resisted internal/external rotation. Negative apprehension. NV intact distally.  Back: No gross deformity, scoliosis. No TTP .  No midline or bony TTP. FROM. Strength LEs 5/5 all muscle groups.   2+ MSRs in patellar and achilles tendons, equal bilaterally. Negative SLRs. Sensation intact to light touch bilaterally. Negative logroll bilateral hips Negative fabers and piriformis stretches.    Assessment & Plan:  1. Right shoulder pain - 2/2 rotator cuff impingement - shown home exercises to do daily.  Tylenol, nsaids,  topical medicines reviewed.  Consider physical therapy, injection, nitro patches, further imaging if not improving.  2. Low back pain - most consistent with degenerative disc disease.  Discussed tylenol, nsaids, glucosamine, topical medications.  Shown home exercises.  Consider PT, imaging if not improving.

## 2014-11-10 NOTE — Assessment & Plan Note (Signed)
most consistent with degenerative disc disease.  Discussed tylenol, nsaids, glucosamine, topical medications.  Shown home exercises.  Consider PT, imaging if not improving.

## 2014-11-10 NOTE — Assessment & Plan Note (Signed)
2/2 rotator cuff impingement - shown home exercises to do daily.  Tylenol, nsaids, topical medicines reviewed.  Consider physical therapy, injection, nitro patches, further imaging if not improving.

## 2014-12-07 ENCOUNTER — Encounter: Payer: Self-pay | Admitting: Family Medicine

## 2014-12-07 ENCOUNTER — Ambulatory Visit (INDEPENDENT_AMBULATORY_CARE_PROVIDER_SITE_OTHER): Payer: Federal, State, Local not specified - PPO | Admitting: Family Medicine

## 2014-12-07 VITALS — BP 110/71 | HR 70 | Ht 69.0 in | Wt 220.0 lb

## 2014-12-07 DIAGNOSIS — M25562 Pain in left knee: Secondary | ICD-10-CM | POA: Diagnosis not present

## 2014-12-07 NOTE — Patient Instructions (Signed)
You have popliteus spasms. Do locking/unlocking exercise of the knee 3 sets of 15 once a day for next 6 weeks. I wouldn't put any restrictions on you in the gym regarding exercise. A compression sleeve may be helpful (brace is ok too). Heat 15 minutes at a time 3-4 times a day. Medicines, injections, other treatments are usually not effective for this. If you start to get pain or this worsens let me know - would consider MRI of your knee if that's the case.

## 2014-12-12 DIAGNOSIS — M25562 Pain in left knee: Secondary | ICD-10-CM | POA: Insufficient documentation

## 2014-12-12 NOTE — Progress Notes (Signed)
PCP: Garnet Koyanagi, DO  Subjective:   HPI: Patient is a 49 y.o. male here for left knee issues.  Patient denies known injury or trauma. He reports he is getting a sharp intermittent feeling of giving way in posterior left knee. Seems to be getting more frequent. Also only occurs when walking but difficult to reproduce this. No pain or swelling currently. This does not last more than that initial second.  Past Medical History  Diagnosis Date  . Hyperlipidemia   . Allergy   . Asthma   . GERD (gastroesophageal reflux disease)   . Hyperplastic colonic polyp 09/01/2009  . Gastric polyp 08/06/2010    Current Outpatient Prescriptions on File Prior to Visit  Medication Sig Dispense Refill  . atorvastatin (LIPITOR) 40 MG tablet Take 1 tablet (40 mg total) by mouth at bedtime. 30 tablet 5  . levocetirizine (XYZAL) 5 MG tablet TAKE 1 TABLET BY MOUTH IN THE EVENING 30 tablet 6  . LOTEMAX 0.5 % ophthalmic suspension   0  . PROVENTIL HFA 108 (90 BASE) MCG/ACT inhaler INHALE 2 PUFFS INTO THE LUNGS EVERY 6 (SIX) HOURS AS NEEDED. 6.7 each 2   No current facility-administered medications on file prior to visit.    Past Surgical History  Procedure Laterality Date  . Achilles tendon repair Right 10/2011    No Known Allergies  History   Social History  . Marital Status: Married    Spouse Name: N/A  . Number of Children: 0  . Years of Education: N/A   Occupational History  . dept [employer] --w/s    Social History Main Topics  . Smoking status: Never Smoker   . Smokeless tobacco: Never Used  . Alcohol Use: 0.0 oz/week    0 Standard drinks or equivalent per week     Comment: rare  . Drug Use: No  . Sexual Activity:    Partners: Female   Other Topics Concern  . Not on file   Social History Narrative   Exercise-- 3x a week    Family History  Problem Relation Age of Onset  . Diabetes Mother   . Hypertension Mother   . Kidney disease Mother   . Diabetes type II Brother   .  Hypertension Brother   . Diabetes Brother   . Diabetes Sister   . Colon cancer Maternal Aunt   . Heart disease Father     defibrillator    BP 110/71 mmHg  Pulse 70  Ht 5\' 9"  (1.753 m)  Wt 220 lb (99.791 kg)  BMI 32.47 kg/m2  Review of Systems: See HPI above.    Objective:  Physical Exam:  Gen: NAD  Left knee: No gross deformity, ecchymoses, swelling. No TTP. FROM. Negative ant/post drawers. Negative valgus/varus testing. Negative lachmanns. Negative mcmurrays, apleys, patellar apprehension. NV intact distally.    Assessment & Plan:  1. Left knee pain - more a feeling of instability that lasts only a second and is posterior.  Exam is benign, reassuring.  Likely due to popliteus spasms which we discussed is difficult to treat.  Heat, shown a home exercise and stretching to do daily.  Physical therapy is an option.  Discussed it's possible he has a small meniscus tear despite normal exam.  Would consider MRI if this persists.

## 2014-12-12 NOTE — Assessment & Plan Note (Signed)
more a feeling of instability that lasts only a second and is posterior.  Exam is benign, reassuring.  Likely due to popliteus spasms which we discussed is difficult to treat.  Heat, shown a home exercise and stretching to do daily.  Physical therapy is an option.  Discussed it's possible he has a small meniscus tear despite normal exam.  Would consider MRI if this persists.

## 2014-12-20 ENCOUNTER — Ambulatory Visit: Payer: Federal, State, Local not specified - PPO | Admitting: Family Medicine

## 2015-01-31 ENCOUNTER — Other Ambulatory Visit: Payer: Self-pay | Admitting: Family Medicine

## 2015-01-31 NOTE — Telephone Encounter (Signed)
Atorvastatin refill sent to pharmacy, #30 x 1 refill.  Pt last saw Dr Etter Sjogren 07/2014 and advised 6 month f/u.  Please call pt to arrange fasting f/u.  Thanks!

## 2015-02-09 NOTE — Telephone Encounter (Signed)
Called pt and he states he has to check work schedule next week and will call back to make appt.

## 2015-04-20 ENCOUNTER — Telehealth: Payer: Self-pay | Admitting: Medical

## 2015-04-20 NOTE — Telephone Encounter (Signed)
psa did come down on last check. So will close out overdue result. Looks like got done.

## 2015-04-28 ENCOUNTER — Other Ambulatory Visit: Payer: Self-pay | Admitting: Family Medicine

## 2015-05-12 ENCOUNTER — Other Ambulatory Visit: Payer: Self-pay | Admitting: Family Medicine

## 2015-06-20 ENCOUNTER — Other Ambulatory Visit: Payer: Self-pay | Admitting: Family Medicine

## 2015-06-20 DIAGNOSIS — Z20828 Contact with and (suspected) exposure to other viral communicable diseases: Secondary | ICD-10-CM

## 2015-06-20 MED ORDER — OSELTAMIVIR PHOSPHATE 75 MG PO CAPS: 75.0000 mg | ORAL_CAPSULE | Freq: Every day | ORAL | Status: DC

## 2015-08-21 ENCOUNTER — Telehealth: Payer: Self-pay | Admitting: *Deleted

## 2015-08-21 NOTE — Telephone Encounter (Signed)
Pt unavailable at time of call. He will try to call back later.

## 2015-08-22 ENCOUNTER — Encounter: Payer: Self-pay | Admitting: Family Medicine

## 2015-08-22 ENCOUNTER — Ambulatory Visit (INDEPENDENT_AMBULATORY_CARE_PROVIDER_SITE_OTHER): Payer: Federal, State, Local not specified - PPO | Admitting: Family Medicine

## 2015-08-22 VITALS — BP 116/70 | HR 64 | Temp 97.9°F | Ht 69.0 in | Wt 227.6 lb

## 2015-08-22 DIAGNOSIS — E785 Hyperlipidemia, unspecified: Secondary | ICD-10-CM | POA: Diagnosis not present

## 2015-08-22 DIAGNOSIS — Z0000 Encounter for general adult medical examination without abnormal findings: Secondary | ICD-10-CM | POA: Diagnosis not present

## 2015-08-22 LAB — POCT URINALYSIS DIPSTICK
Bilirubin, UA: NEGATIVE
Blood, UA: NEGATIVE
Glucose, UA: NEGATIVE
KETONES UA: NEGATIVE
Leukocytes, UA: NEGATIVE
Nitrite, UA: NEGATIVE
PROTEIN UA: NEGATIVE
SPEC GRAV UA: 1.015
UROBILINOGEN UA: 0.2
pH, UA: 6

## 2015-08-22 NOTE — Progress Notes (Signed)
Patient ID: Juan Velazquez, male    DOB: [birth year]  Age: 50 y.o. MRN: [id number]    Subjective:  Subjective HPI Juan Velazquez presents for cpe ---  Pt misundertood instructions for statin and he stopped taking it.    Review of Systems  Constitutional: Negative.  Negative for diaphoresis, appetite change, fatigue and unexpected weight change.  HENT: Negative for congestion, ear pain, hearing loss, nosebleeds, postnasal drip, rhinorrhea, sinus pressure, sneezing and tinnitus.   Eyes: Negative for photophobia, pain, discharge, redness, itching and visual disturbance.  Respiratory: Negative.  Negative for cough, chest tightness, shortness of breath and wheezing.   Cardiovascular: Negative.  Negative for chest pain, palpitations and leg swelling.  Gastrointestinal: Negative for abdominal pain, constipation, blood in stool, abdominal distention and anal bleeding.  Endocrine: Negative.  Negative for cold intolerance, heat intolerance, polydipsia, polyphagia and polyuria.  Genitourinary: Negative.  Negative for dysuria, frequency and difficulty urinating.  Musculoskeletal: Negative.   Skin: Negative.   Allergic/Immunologic: Negative.   Neurological: Negative for dizziness, weakness, light-headedness, numbness and headaches.  Psychiatric/Behavioral: Negative for suicidal ideas, confusion, sleep disturbance, dysphoric mood, decreased concentration and agitation. The patient is not nervous/anxious.     History Past Medical History  Diagnosis Date  . Hyperlipidemia   . Allergy   . Asthma   . GERD (gastroesophageal reflux disease)   . Hyperplastic colonic polyp 09/01/2009  . Gastric polyp 08/06/2010    He has past surgical history that includes Achilles tendon repair (Right, 10/2011).   His family history includes Colon cancer in his maternal aunt; Diabetes in his brother, mother, and sister; Diabetes type II in his brother; Heart disease in his father; Hypertension in his brother  and mother; Kidney disease in his mother.He reports that he has never smoked. He has never used smokeless tobacco. He reports that he drinks alcohol. He reports that he does not use illicit drugs.  Current Outpatient Prescriptions on File Prior to Visit  Medication Sig Dispense Refill  . levocetirizine (XYZAL) 5 MG tablet TAKE 1 TABLET BY MOUTH IN THE EVENING 30 tablet 6  . PROVENTIL HFA 108 (90 BASE) MCG/ACT inhaler INHALE 2 PUFFS INTO THE LUNGS EVERY 6 (SIX) HOURS AS NEEDED. 6.7 each 2  . atorvastatin (LIPITOR) 40 MG tablet Take 1 tablet (40 mg total) by mouth at bedtime. Repeat labs are due now (Patient not taking: Reported on 08/22/2015) 30 tablet 0   No current facility-administered medications on file prior to visit.     Objective:  Objective Physical Exam  Constitutional: He is oriented to person, place, and time. He appears well-developed and well-nourished. No distress.  HENT:  Head: Normocephalic and atraumatic.  Right Ear: External ear normal.  Left Ear: External ear normal.  Nose: Nose normal.  Mouth/Throat: Oropharynx is clear and moist. No oropharyngeal exudate.  Eyes: Conjunctivae and EOM are normal. Pupils are equal, round, and reactive to light. Right eye exhibits no discharge. Left eye exhibits no discharge.  Neck: Normal range of motion. Neck supple. No JVD present. No thyromegaly present.  Cardiovascular: Normal rate, regular rhythm and intact distal pulses.  Exam reveals no gallop and no friction rub.   No murmur heard. Pulmonary/Chest: Effort normal and breath sounds normal. No respiratory distress. He has no wheezes. He has no rales. He exhibits no tenderness.  Abdominal: Soft. Bowel sounds are normal. He exhibits no distension and no mass. There is no tenderness. There is no rebound and no guarding.  Genitourinary: Rectum normal, prostate normal  and penis normal. Guaiac negative stool.  Musculoskeletal: Normal range of motion. He exhibits no edema or tenderness.    Lymphadenopathy:    He has no cervical adenopathy.  Neurological: He is alert and oriented to person, place, and time. He displays normal reflexes. He exhibits normal muscle tone.  Skin: Skin is warm and dry. No rash noted. He is not diaphoretic. No erythema. No pallor.  Psychiatric: He has a normal mood and affect. His behavior is normal. Judgment and thought content normal.  Nursing note and vitals reviewed.  BP 116/70 mmHg  Pulse 64  Temp(Src) 97.9 F (36.6 C) (Oral)  Ht 5\' 9"  (1.753 m)  Wt 227 lb 9.6 oz (103.239 kg)  BMI 33.60 kg/m2  SpO2 98% Wt Readings from Last 3 Encounters:  08/22/15 227 lb 9.6 oz (103.239 kg)  12/07/14 220 lb (99.791 kg)  11/07/14 215 lb (97.523 kg)     Lab Results  Component Value Date   WBC 5.4 08/22/2015   HGB 15.0 08/22/2015   HCT 45.3 08/22/2015   PLT 220.0 08/22/2015   GLUCOSE 91 08/22/2015   CHOL 224* 08/22/2015   TRIG 105.0 08/22/2015   HDL 54.70 08/22/2015   LDLDIRECT 133.7 07/01/2012   LDLCALC 149* 08/22/2015   ALT 30 08/22/2015   AST 33 08/22/2015   NA 140 08/22/2015   K 4.1 08/22/2015   CL 104 08/22/2015   CREATININE 1.40 08/22/2015   BUN 13 08/22/2015   CO2 31 08/22/2015   TSH 1.75 08/22/2015   PSA 1.84 08/22/2015   MICROALBUR <0.7 07/26/2014    Dg Chest 2 View  08/13/2010  *RADIOLOGY REPORT* Clinical Data: Cough and congestion. CHEST - 2 VIEW Comparison: Chest 06/15/2010. Findings: Lungs are clear.  Heart size is normal.  No pneumothorax or pleural effusion. IMPRESSION: Negative chest. Original Report Authenticated [id number]    Assessment & Plan:  Plan I have discontinued Juan Velazquez and oseltamivir. I am also having him maintain his levocetirizine, PROVENTIL HFA, atorvastatin, and diclofenac.  Meds ordered this encounter  Medications  . diclofenac (VOLTAREN) 75 MG EC tablet    Sig: Take 1 tablet by mouth as needed.    Refill:  1    Problem List Items Addressed This Visit      Unprioritized    Hyperlipidemia    Check labs Will probably need to restart lipitor      Relevant Orders   POCT urinalysis dipstick (Completed)   TSH (Completed)   PSA (Completed)   Lipid panel (Completed)   CBC with Differential/Platelet (Completed)   Comprehensive metabolic panel (Completed)   Preventative health care - Primary    Check labs See AVS ghm utd      Relevant Orders   POCT urinalysis dipstick (Completed)   TSH (Completed)   PSA (Completed)   Lipid panel (Completed)   CBC with Differential/Platelet (Completed)   Comprehensive metabolic panel (Completed)      Follow-up: Return in about 3 months (around 11/21/2015), or if symptoms worsen or fail to improve, for hyperlipidemia.  Ann Held, DO

## 2015-08-22 NOTE — Progress Notes (Signed)
Pre visit review using our clinic review tool, if applicable. No additional management support is needed unless otherwise documented below in the visit note. 

## 2015-08-22 NOTE — Patient Instructions (Signed)

## 2015-08-23 LAB — CBC WITH DIFFERENTIAL/PLATELET
Basophils Absolute: 0 10*3/uL (ref 0.0–0.1)
Basophils Relative: 0.5 % (ref 0.0–3.0)
EOS ABS: 0.2 10*3/uL (ref 0.0–0.7)
EOS PCT: 2.9 % (ref 0.0–5.0)
HCT: 45.3 % (ref 39.0–52.0)
Hemoglobin: 15 g/dL (ref 13.0–17.0)
LYMPHS ABS: 1.5 10*3/uL (ref 0.7–4.0)
Lymphocytes Relative: 27.3 % (ref 12.0–46.0)
MCHC: 33.1 g/dL (ref 30.0–36.0)
MCV: 89.2 fl (ref 78.0–100.0)
MONO ABS: 0.5 10*3/uL (ref 0.1–1.0)
Monocytes Relative: 8.5 % (ref 3.0–12.0)
NEUTROS PCT: 60.8 % (ref 43.0–77.0)
Neutro Abs: 3.3 10*3/uL (ref 1.4–7.7)
Platelets: 220 10*3/uL (ref 150.0–400.0)
RBC: 5.08 Mil/uL (ref 4.22–5.81)
RDW: 13.4 % (ref 11.5–15.5)
WBC: 5.4 10*3/uL (ref 4.0–10.5)

## 2015-08-23 LAB — LIPID PANEL
CHOL/HDL RATIO: 4
Cholesterol: 224 mg/dL — ABNORMAL HIGH (ref 0–200)
HDL: 54.7 mg/dL (ref >39.00)
LDL CALC: 149 mg/dL — AB (ref 0–99)
NONHDL: 169.63
Triglycerides: 105 mg/dL (ref 0.0–149.0)
VLDL: 21 mg/dL (ref 0.0–40.0)

## 2015-08-23 LAB — COMPREHENSIVE METABOLIC PANEL
ALT: 30 U/L (ref 0–53)
AST: 33 U/L (ref 0–37)
Albumin: 4.4 g/dL (ref 3.5–5.2)
Alkaline Phosphatase: 70 U/L (ref 39–117)
BUN: 13 mg/dL (ref 6–23)
CHLORIDE: 104 meq/L (ref 96–112)
CO2: 31 mEq/L (ref 19–32)
Calcium: 9.7 mg/dL (ref 8.4–10.5)
Creatinine, Ser: 1.4 mg/dL (ref 0.40–1.50)
GFR: 69.22 mL/min (ref >60.00)
GLUCOSE: 91 mg/dL (ref 70–99)
POTASSIUM: 4.1 meq/L (ref 3.5–5.1)
SODIUM: 140 meq/L (ref 135–145)
TOTAL PROTEIN: 7.2 g/dL (ref 6.0–8.3)
Total Bilirubin: 0.7 mg/dL (ref 0.2–1.2)

## 2015-08-23 LAB — PSA: PSA: 1.84 ng/mL (ref 0.10–4.00)

## 2015-08-23 LAB — TSH: TSH: 1.75 u[IU]/mL (ref 0.35–4.50)

## 2015-08-23 NOTE — Assessment & Plan Note (Signed)
Check labs See AVS ghm utd  

## 2015-08-23 NOTE — Assessment & Plan Note (Signed)
Check labs Will probably need to restart lipitor

## 2015-08-28 DIAGNOSIS — F4323 Adjustment disorder with mixed anxiety and depressed mood: Secondary | ICD-10-CM | POA: Diagnosis not present

## 2015-10-03 ENCOUNTER — Other Ambulatory Visit: Payer: Self-pay | Admitting: Family Medicine

## 2015-10-05 ENCOUNTER — Other Ambulatory Visit: Payer: Self-pay | Admitting: Family Medicine

## 2015-10-24 ENCOUNTER — Ambulatory Visit (HOSPITAL_BASED_OUTPATIENT_CLINIC_OR_DEPARTMENT_OTHER)
Admission: RE | Admit: 2015-10-24 | Discharge: 2015-10-24 | Disposition: A | Discharge status: Home / Self Care | Payer: Federal, State, Local not specified - PPO | Source: Ambulatory Visit | Attending: Medical | Admitting: Medical

## 2015-10-24 ENCOUNTER — Ambulatory Visit (INDEPENDENT_AMBULATORY_CARE_PROVIDER_SITE_OTHER): Payer: Federal, State, Local not specified - PPO | Admitting: Medical

## 2015-10-24 ENCOUNTER — Encounter: Payer: Self-pay | Admitting: Medical

## 2015-10-24 ENCOUNTER — Ambulatory Visit: Payer: Federal, State, Local not specified - PPO | Admitting: Family Medicine

## 2015-10-24 VITALS — BP 114/74 | HR 63 | Temp 98.0°F | Ht 69.0 in | Wt 228.8 lb

## 2015-10-24 DIAGNOSIS — L0390 Cellulitis, unspecified: Secondary | ICD-10-CM | POA: Diagnosis not present

## 2015-10-24 DIAGNOSIS — L02611 Cutaneous abscess of right foot: Secondary | ICD-10-CM | POA: Diagnosis not present

## 2015-10-24 DIAGNOSIS — M79674 Pain in right toe(s): Secondary | ICD-10-CM

## 2015-10-24 DIAGNOSIS — L089 Local infection of the skin and subcutaneous tissue, unspecified: Secondary | ICD-10-CM | POA: Insufficient documentation

## 2015-10-24 DIAGNOSIS — F4323 Adjustment disorder with mixed anxiety and depressed mood: Secondary | ICD-10-CM | POA: Diagnosis not present

## 2015-10-24 DIAGNOSIS — L0291 Cutaneous abscess, unspecified: Secondary | ICD-10-CM | POA: Diagnosis not present

## 2015-10-24 DIAGNOSIS — M79609 Pain in unspecified limb: Secondary | ICD-10-CM | POA: Diagnosis not present

## 2015-10-24 LAB — CBC WITH DIFFERENTIAL/PLATELET
BASOS ABS: 0 10*3/uL (ref 0.0–0.1)
Basophils Relative: 0.5 % (ref 0.0–3.0)
EOS ABS: 0.1 10*3/uL (ref 0.0–0.7)
Eosinophils Relative: 2.4 % (ref 0.0–5.0)
HCT: 44.5 % (ref 39.0–52.0)
Hemoglobin: 14.6 g/dL (ref 13.0–17.0)
LYMPHS ABS: 1.4 10*3/uL (ref 0.7–4.0)
LYMPHS PCT: 25.4 % (ref 12.0–46.0)
MCHC: 32.8 g/dL (ref 30.0–36.0)
MCV: 89.3 fl (ref 78.0–100.0)
Monocytes Absolute: 0.6 10*3/uL (ref 0.1–1.0)
Monocytes Relative: 10.6 % (ref 3.0–12.0)
NEUTROS ABS: 3.4 10*3/uL (ref 1.4–7.7)
NEUTROS PCT: 61.1 % (ref 43.0–77.0)
PLATELETS: 211 10*3/uL (ref 150.0–400.0)
RBC: 4.98 Mil/uL (ref 4.22–5.81)
RDW: 13 % (ref 11.5–15.5)
WBC: 5.5 10*3/uL (ref 4.0–10.5)

## 2015-10-24 LAB — URIC ACID: URIC ACID, SERUM: 3.5 mg/dL — AB (ref 4.0–7.8)

## 2015-10-24 MED ORDER — DOXYCYCLINE HYCLATE 100 MG PO TABS: 100.0000 mg | ORAL_TABLET | Freq: Two times a day (BID) | ORAL | Status: DC

## 2015-10-24 NOTE — Patient Instructions (Addendum)
We did I and D of the infected appearing blister. Did get wound culture. I and D done with 11 blade sterile manner. Dressed with telfa and gave some telfa pad  so pt can use at home.  Will get cbc today. Uric acid as well since you expressed gout concern.  Rx doxycycline antibiotic.   Could do warm salt water soaks bid.  Rt toe pain and foot pain. Will get xray.  If any spreading redness or tracking up foot or leg then ED evaluation.  Follow up in 7 days or as needed

## 2015-10-24 NOTE — Progress Notes (Signed)
Subjective:    Patient ID: Juan Velazquez, male    DOB: Dec 07, 1965, 50 y.o.   MRN: [id number]  HPI  Pt in for some rt toe pain. He felt some mild discomfort in foot area. Slight discomfort on Sunday. On Monday standing and walking felt increased pain. Then at end of day. Foot was very tender. Pt by Monday night pain was a lot. Then this am redness to foot, redness and rt toe and small blister at base of his rt great toenail.  Pt is not diabetic.     Review of Systems  Constitutional: Negative for chills and fatigue.  Respiratory: Negative for cough, chest tightness, shortness of breath and wheezing.   Cardiovascular: Negative for chest pain and palpitations.  Musculoskeletal: Negative for back pain.       Rt foot/toe pain.  Skin: Negative for rash.  Neurological: Negative for dizziness and headaches.  Hematological: Negative for adenopathy. Does not bruise/bleed easily.  Psychiatric/Behavioral: Negative for behavioral problems and confusion.    Past Medical History  Diagnosis Date  . Hyperlipidemia   . Allergy   . Asthma   . GERD (gastroesophageal reflux disease)   . Hyperplastic colonic polyp 09/01/2009  . Gastric polyp 08/06/2010     Social History   Social History  . Marital Status: Married    Spouse Name: N/A  . Number of Children: 0  . Years of Education: N/A   Occupational History  . dept [employer] --w/s    Social History Main Topics  . Smoking status: Never Smoker   . Smokeless tobacco: Never Used  . Alcohol Use: 0.0 oz/week    0 Standard drinks or equivalent per week     Comment: rare  . Drug Use: No  . Sexual Activity:    Partners: Female   Other Topics Concern  . Not on file   Social History Narrative   Exercise-- 3x a week    Past Surgical History  Procedure Laterality Date  . Achilles tendon repair Right 10/2011    Family History  Problem Relation Age of Onset  . Diabetes Mother   . Hypertension Mother   . Kidney disease Mother   .  Diabetes type II Brother   . Hypertension Brother   . Diabetes Brother   . Diabetes Sister   . Colon cancer Maternal Aunt   . Heart disease Father     defibrillator    No Known Allergies  Current Outpatient Prescriptions on File Prior to Visit  Medication Sig Dispense Refill  . atorvastatin (LIPITOR) 40 MG tablet Take 1 tablet (40 mg total) by mouth at bedtime. 30 tablet 5  . diclofenac (VOLTAREN) 75 MG EC tablet Take 1 tablet by mouth as needed.  1  . levocetirizine (XYZAL) 5 MG tablet TAKE 1 TABLET BY MOUTH IN THE EVENING 30 tablet 6  . PROAIR HFA 108 (90 Base) MCG/ACT inhaler INHALE 2 PUFFS INTO THE LUNGS EVERY 6 HOURS AS NEEDED 8.5 Inhaler 5   No current facility-administered medications on file prior to visit.    BP 114/74 mmHg  Pulse 63  Temp(Src) 98 F (36.7 C) (Oral)  Ht 5\' 9"  (1.753 m)  Wt 228 lb 12.8 oz (103.783 kg)  BMI 33.77 kg/m2  SpO2 100%       Objective:   Physical Exam  General- No acute distress. Pleasant patient. Lungs- Clear, even and unlabored. Heart- regular rate and rhythm. Neurologic- CNII- XII grossly intact.  Rt foot- base of his  rt great toe. Blister about 2 cm by 8 mm. Appears filled with yellow fluid.(not serosangious). Toe is red and faint redness up mid 1st metatarsal area. Warmth to toe and up first metatarsal.         Assessment & Plan:  We did I and D of the infected appearing blister. Did get wound culture. I and D done with 11 blade sterile manner. Dressed with telfa and gave some telfa pad  so pt can use at home.  Will get cbc today. Uric acid as well since you expressed gout concern.  Rx doxycycline antibiotic.   Could do warm salt water soaks bid.  Rt toe pain and foot pain. Will get xray.  Follow up in 7 days or as needed   Pt signed consent form and we placed for scanning.(asked MA to make a copy)   Rilyn Upshaw, Percell Miller, [employer]

## 2015-10-24 NOTE — Progress Notes (Signed)
Pre visit review using our clinic tool,if applicable. No additional management support is needed unless otherwise documented below in the visit note.  

## 2015-10-27 LAB — WOUND CULTURE: Gram Stain: NONE SEEN

## 2015-11-07 DIAGNOSIS — F4323 Adjustment disorder with mixed anxiety and depressed mood: Secondary | ICD-10-CM | POA: Diagnosis not present

## 2015-11-23 ENCOUNTER — Ambulatory Visit: Payer: Federal, State, Local not specified - PPO | Admitting: Family Medicine

## 2015-11-27 ENCOUNTER — Ambulatory Visit: Payer: Federal, State, Local not specified - PPO | Admitting: Family Medicine

## 2015-11-28 DIAGNOSIS — F4323 Adjustment disorder with mixed anxiety and depressed mood: Secondary | ICD-10-CM | POA: Diagnosis not present

## 2015-12-29 ENCOUNTER — Encounter: Payer: Self-pay | Admitting: Family Medicine

## 2015-12-29 ENCOUNTER — Ambulatory Visit (INDEPENDENT_AMBULATORY_CARE_PROVIDER_SITE_OTHER): Payer: Federal, State, Local not specified - PPO | Admitting: Family Medicine

## 2015-12-29 DIAGNOSIS — M25561 Pain in right knee: Secondary | ICD-10-CM

## 2015-12-29 NOTE — Patient Instructions (Signed)
Your knee pain and swelling are consistent with arthritis. Aleve 1-2 tabs twice a day with food OR ibuprofen 800mg  three times a day with food for pain and inflammation - take regularly for 7-10 days then as needed. Glucosamine sulfate 750mg  twice a day is a supplement that may help. Capsaicin, aspercreme, or biofreeze topically up to four times a day may also help with pain. Cortisone injections are an option - can consider draining and cortisone shot too. It's important that you continue to stay active. I would avoid deep squats, lunges, leg press for next couple weeks. Icing 15 minutes at a time 3-4 times a day. ACE wrap or compression sleeve to keep swelling down and for support. Elevate above the level of your heart as much as possible. Follow up with me in 2-3 weeks if you're not noticing improvement and want to consider an injection.

## 2016-01-01 DIAGNOSIS — M25561 Pain in right knee: Secondary | ICD-10-CM | POA: Insufficient documentation

## 2016-01-01 NOTE — Assessment & Plan Note (Signed)
associated effusion.  Exam is benign otherwise however.  Suggests a flare of arthritis as most likely cause.  Discussed regular ibuprofen or aleve for 7-10 days then as needed.  Discussed tylenol, topical medication.  Consider injection if not improving (with or without aspiration).  Avoid deep squats, lunges, leg press.  ACE wrap or sleeve for compression.  F/u in 2-3 weeks if not improving.

## 2016-01-01 NOTE — Progress Notes (Signed)
PCP: Ann Held, DO  Subjective:   HPI: Patient is a 50 y.o. male here for right knee pain.  Patient denies known injury. He recalls about 2 weeks ago doing leg workout with squats, presses, lunges, leg extensions then this past Sunday did the bike. But pain started on Tuesday without change in routine or increase in activity level. Pain anterior knee at 1/10 level but up to 8/10 and sharp with walking. Difficulty sleeping at night. + swelling. No skin changes, numbness.  Past Medical History:  Diagnosis Date  . Allergy   . Asthma   . Gastric polyp 08/06/2010  . GERD (gastroesophageal reflux disease)   . Hyperlipidemia   . Hyperplastic colonic polyp 09/01/2009    Current Outpatient Prescriptions on File Prior to Visit  Medication Sig Dispense Refill  . atorvastatin (LIPITOR) 40 MG tablet Take 1 tablet (40 mg total) by mouth at bedtime. 30 tablet 5  . diclofenac (VOLTAREN) 75 MG EC tablet Take 1 tablet by mouth as needed.  1  . doxycycline (VIBRA-TABS) 100 MG tablet Take 1 tablet (100 mg total) by mouth 2 (two) times daily. 20 tablet 0  . levocetirizine (XYZAL) 5 MG tablet TAKE 1 TABLET BY MOUTH IN THE EVENING 30 tablet 6  . PROAIR HFA 108 (90 Base) MCG/ACT inhaler INHALE 2 PUFFS INTO THE LUNGS EVERY 6 HOURS AS NEEDED 8.5 Inhaler 5   No current facility-administered medications on file prior to visit.     Past Surgical History:  Procedure Laterality Date  . ACHILLES TENDON REPAIR Right 10/2011    No Known Allergies  Social History   Social History  . Marital status: Married    Spouse name: N/A  . Number of children: 0  . Years of education: N/A   Occupational History  . dept [employer] --w/s [employer]   Social History Main Topics  . Smoking status: Never Smoker  . Smokeless tobacco: Never Used  . Alcohol use 0.0 oz/week     Comment: rare  . Drug use: No  . Sexual activity: Yes    Partners: Female   Other Topics Concern  . Not on file    Social History Narrative   Exercise-- 3x a week    Family History  Problem Relation Age of Onset  . Diabetes Mother   . Hypertension Mother   . Kidney disease Mother   . Diabetes type II Brother   . Hypertension Brother   . Diabetes Brother   . Diabetes Sister   . Colon cancer Maternal Aunt   . Heart disease Father     defibrillator    BP 120/74   Pulse (!) 57   Ht 5\' 9"  (1.753 m)   Wt 225 lb (102.1 kg)   BMI 33.23 kg/m   Review of Systems: See HPI above.    Objective:  Physical Exam:  Gen: NAD, comfortable in exam room  Right knee: Mod effusion.  No bruising, other deformity.  No warmth, redness. No TTP. FROM. Negative ant/post drawers. Negative valgus/varus testing. Negative lachmanns. Negative mcmurrays, apleys, patellar apprehension, thessalys, sit home. NV intact distally.  Left knee: FROM without pain.    Assessment & Plan:  1. Right knee pain - associated effusion.  Exam is benign otherwise however.  Suggests a flare of arthritis as most likely cause.  Discussed regular ibuprofen or aleve for 7-10 days then as needed.  Discussed tylenol, topical medication.  Consider injection if not improving (with or without aspiration).  Avoid deep squats, lunges, leg press.  ACE wrap or sleeve for compression.  F/u in 2-3 weeks if not improving.

## 2016-03-21 ENCOUNTER — Ambulatory Visit (INDEPENDENT_AMBULATORY_CARE_PROVIDER_SITE_OTHER): Payer: Federal, State, Local not specified - PPO | Admitting: Family Medicine

## 2016-03-21 ENCOUNTER — Encounter: Payer: Self-pay | Admitting: Family Medicine

## 2016-03-21 VITALS — BP 110/70 | HR 72 | Temp 99.5°F | Resp 16 | Ht 69.0 in | Wt 229.4 lb

## 2016-03-21 DIAGNOSIS — R05 Cough: Secondary | ICD-10-CM | POA: Diagnosis not present

## 2016-03-21 DIAGNOSIS — J9801 Acute bronchospasm: Secondary | ICD-10-CM

## 2016-03-21 DIAGNOSIS — R058 Other specified cough: Secondary | ICD-10-CM

## 2016-03-21 DIAGNOSIS — R0602 Shortness of breath: Secondary | ICD-10-CM | POA: Diagnosis not present

## 2016-03-21 MED ORDER — METHYLPREDNISOLONE ACETATE 40 MG/ML IJ SUSP: 40.0000 mg | Freq: Once | INTRAMUSCULAR | Status: DC

## 2016-03-21 MED ORDER — METHYLPREDNISOLONE ACETATE 80 MG/ML IJ SUSP: 80.0000 mg | Freq: Once | INTRAMUSCULAR | Status: DC

## 2016-03-21 MED ORDER — PREDNISONE 10 MG PO TABS: ORAL_TABLET | ORAL | 0 refills | Status: DC

## 2016-03-21 MED ORDER — METHYLPREDNISOLONE ACETATE 40 MG/ML IJ SUSP
40.0000 mg | Freq: Once | INTRAMUSCULAR | Status: DC
  Administered 2016-03-21: 80 mg via INTRAMUSCULAR

## 2016-03-21 MED ORDER — ALBUTEROL SULFATE (2.5 MG/3ML) 0.083% IN NEBU
2.5000 mg | INHALATION_SOLUTION | Freq: Once | RESPIRATORY_TRACT | Status: AC
  Administered 2016-03-21: 2.5 mg via RESPIRATORY_TRACT

## 2016-03-21 NOTE — Progress Notes (Signed)
Patient ID: Juan Velazquez, male    DOB: 11-15-1965  Age: 50 y.o. MRN: [id number]    Subjective:  Subjective  HPI Juan Velazquez presents for cough and tightness in chest,  + tickle in throat. No fevers, chills Took robitussin dm with little relief.   No head congestion   Review of Systems  Constitutional: Negative for chills and fever.  HENT: Positive for postnasal drip. Negative for congestion, rhinorrhea, sinus pressure and sore throat.   Respiratory: Positive for cough, chest tightness, shortness of breath and wheezing.   Cardiovascular: Negative for chest pain, palpitations and leg swelling.  Allergic/Immunologic: Negative for environmental allergies.    History Past Medical History:  Diagnosis Date  . Allergy   . Asthma   . Gastric polyp 08/06/2010  . GERD (gastroesophageal reflux disease)   . Hyperlipidemia   . Hyperplastic colonic polyp 09/01/2009    He has a past surgical history that includes Achilles tendon repair (Right, 10/2011).   His family history includes Colon cancer in his maternal aunt; Diabetes in his brother, mother, and sister; Diabetes type II in his brother; Heart disease in his father; Hypertension in his brother and mother; Kidney disease in his mother.He reports that he has never smoked. He has never used smokeless tobacco. He reports that he drinks alcohol. He reports that he does not use drugs.  Current Outpatient Prescriptions on File Prior to Visit  Medication Sig Dispense Refill  . atorvastatin (LIPITOR) 40 MG tablet Take 1 tablet (40 mg total) by mouth at bedtime. 30 tablet 5  . diclofenac (VOLTAREN) 75 MG EC tablet Take 1 tablet by mouth as needed.  1  . levocetirizine (XYZAL) 5 MG tablet TAKE 1 TABLET BY MOUTH IN THE EVENING 30 tablet 6  . PROAIR HFA 108 (90 Base) MCG/ACT inhaler INHALE 2 PUFFS INTO THE LUNGS EVERY 6 HOURS AS NEEDED 8.5 Inhaler 5  . doxycycline (VIBRA-TABS) 100 MG tablet Take 1 tablet (100 mg total) by mouth 2  (two) times daily. (Patient not taking: Reported on 03/21/2016) 20 tablet 0   No current facility-administered medications on file prior to visit.      Objective:  Objective  Physical Exam  Constitutional: He is oriented to person, place, and time. Vital signs are normal. He appears well-developed and well-nourished. He is sleeping.  HENT:  Head: Normocephalic and atraumatic.  Mouth/Throat: Oropharynx is clear and moist.  Eyes: EOM are normal. Pupils are equal, round, and reactive to light.  Neck: Normal range of motion. Neck supple. No thyromegaly present.  Cardiovascular: Normal rate and regular rhythm.   No murmur heard. Pulmonary/Chest: Effort normal. No respiratory distress. He has wheezes. He has no rales. He exhibits no tenderness.  Wheeze resolved with neb -- albuterol   Musculoskeletal: He exhibits no edema or tenderness.  Neurological: He is alert and oriented to person, place, and time.  Skin: Skin is warm and dry.  Psychiatric: He has a normal mood and affect. His behavior is normal. Judgment and thought content normal.  Nursing note and vitals reviewed.  BP 110/70 (BP Location: Left Arm, Patient Position: Sitting, Cuff Size: Normal)   Pulse 72   Temp 99.5 F (37.5 C) (Oral)   Resp 16   Ht 5\' 9"  (1.753 m)   Wt 229 lb 6.4 oz (104.1 kg)   SpO2 97%   BMI 33.88 kg/m  Wt Readings from Last 3 Encounters:  03/21/16 229 lb 6.4 oz (104.1 kg)  12/29/15 225 lb (102.1 kg)  10/24/15 228 lb 12.8 oz (103.8 kg)     Lab Results  Component Value Date   WBC 5.5 10/24/2015   HGB 14.6 10/24/2015   HCT 44.5 10/24/2015   PLT 211.0 10/24/2015   GLUCOSE 91 08/22/2015   CHOL 224 (H) 08/22/2015   TRIG 105.0 08/22/2015   HDL 54.70 08/22/2015   LDLDIRECT 133.7 07/01/2012   LDLCALC 149 (H) 08/22/2015   ALT 30 08/22/2015   AST 33 08/22/2015   NA 140 08/22/2015   K 4.1 08/22/2015   CL 104 08/22/2015   CREATININE 1.40 08/22/2015   BUN 13 08/22/2015   CO2 31 08/22/2015   TSH  1.75 08/22/2015   PSA 1.84 08/22/2015   MICROALBUR <0.7 07/26/2014    Dg Foot Complete Right  Result Date: 10/24/2015 CLINICAL DATA:  Recent abscess at the base of the right great toenail, pain and swelling EXAM: RIGHT FOOT COMPLETE - 3+ VIEW COMPARISON:  None. FINDINGS: No fracture is seen. No focal demineralization or erosion is noted. Joint spaces appear normal. IMPRESSION: Negative. Electronically Signed   By: Ivar Drape M.D.   On: 10/24/2015 15:10     Assessment & Plan:  Plan  I am having Mr. Fryson start on predniSONE. I am also having him maintain his levocetirizine, diclofenac, atorvastatin, PROAIR HFA, and doxycycline. We administered albuterol.  Meds ordered this encounter  Medications  . albuterol (PROVENTIL) (2.5 MG/3ML) 0.083% nebulizer solution 2.5 mg  . predniSONE (DELTASONE) 10 MG tablet    Sig: 3 po qd for 3 days then 2 po qd for 3 days the 1 po qd for 3 days    Dispense:  18 tablet    Refill:  0    Problem List Items Addressed This Visit    None    Visit Diagnoses    Shortness of breath    -  Primary   Relevant Medications   albuterol (PROVENTIL) (2.5 MG/3ML) 0.083% nebulizer solution 2.5 mg (Completed)   Other Relevant Orders   DG Chest 2 View   Non-productive cough       Relevant Medications   albuterol (PROVENTIL) (2.5 MG/3ML) 0.083% nebulizer solution 2.5 mg (Completed)   Other Relevant Orders   DG Chest 2 View   Bronchospasm       Relevant Medications   predniSONE (DELTASONE) 10 MG tablet   Other Relevant Orders   DG Chest 2 View      Follow-up: Return in about 2 weeks (around 04/04/2016) for f/u cough and wheeze.  Ann Held, DO

## 2016-03-21 NOTE — Patient Instructions (Signed)
Bronchospasm, Adult  A bronchospasm is a spasm or tightening of the airways going into the lungs. During a bronchospasm breathing becomes more difficult because the airways get smaller. When this happens there can be coughing, a whistling sound when breathing (wheezing), and difficulty breathing. Bronchospasm is often associated with asthma, but not all patients who experience a bronchospasm have asthma.  CAUSES   A bronchospasm is caused by inflammation or irritation of the airways. The inflammation or irritation may be triggered by:   · Allergies (such as to animals, pollen, food, or mold). Allergens that cause bronchospasm may cause wheezing immediately after exposure or many hours later.    · Infection. Viral infections are believed to be the most common cause of bronchospasm.    · Exercise.    · Irritants (such as pollution, cigarette smoke, strong odors, aerosol sprays, and paint fumes).    · Weather changes. Winds increase molds and pollens in the air. Rain refreshes the air by washing irritants out. Cold air may cause inflammation.    · Stress and emotional upset.    SIGNS AND SYMPTOMS   · Wheezing.    · Excessive nighttime coughing.    · Frequent or severe coughing with a simple cold.    · Chest tightness.    · Shortness of breath.    DIAGNOSIS   Bronchospasm is usually diagnosed through a history and physical exam. Tests, such as chest X-rays, are sometimes done to look for other conditions.  TREATMENT   · Inhaled medicines can be given to open up your airways and help you breathe. The medicines can be given using either an inhaler or a nebulizer machine.  · Corticosteroid medicines may be given for severe bronchospasm, usually when it is associated with asthma.  HOME CARE INSTRUCTIONS   · Always have a plan prepared for seeking medical care. Know when to call your health care provider and local emergency services (911 in the [place] Know where you can access local emergency care.  · Only take medicines as  directed by your health care provider.  · If you were prescribed an inhaler or nebulizer machine, ask your health care provider to explain how to use it correctly. Always use a spacer with your inhaler if you were given one.  · It is necessary to remain calm during an attack. Try to relax and breathe more slowly.   · Control your home environment in the following ways:      Change your heating and air conditioning filter at least once a month.      Limit your use of fireplaces and wood stoves.    Do not smoke and do not allow smoking in your home.      Avoid exposure to perfumes and fragrances.      Get rid of pests (such as roaches and mice) and their droppings.      Throw away plants if you see mold on them.      Keep your house clean and dust free.      Replace carpet with wood, tile, or vinyl flooring. Carpet can trap dander and dust.      Use allergy-proof pillows, mattress covers, and box spring covers.      Wash bed sheets and blankets every week in hot water and dry them in a dryer.      Use blankets that are made of polyester or cotton.      Wash hands frequently.  SEEK MEDICAL CARE IF:   · You have muscle aches.    · You have chest pain.    · The sputum changes from clear or   white to yellow, green, gray, or bloody.    · The sputum you cough up gets thicker.    · There are problems that may be related to the medicine you are given, such as a rash, itching, swelling, or trouble breathing.    SEEK IMMEDIATE MEDICAL CARE IF:   · You have worsening wheezing and coughing even after taking your prescribed medicines.    · You have increased difficulty breathing.    · You develop severe chest pain.  MAKE SURE YOU:   · Understand these instructions.  · Will watch your condition.  · Will get help right away if you are not doing well or get worse.     This information is not intended to replace advice given to you by your health care provider. Make sure you discuss any questions you have with your health care  provider.     Document Released: 05/09/2003 Document Revised: 05/27/2014 Document Reviewed: 10/26/2012  Elsevier Interactive Patient Education ©2016 [employer]

## 2016-03-21 NOTE — Progress Notes (Signed)
Pre visit review using our clinic review tool, if applicable. No additional management support is needed unless otherwise documented below in the visit note. 

## 2016-03-22 ENCOUNTER — Ambulatory Visit (HOSPITAL_BASED_OUTPATIENT_CLINIC_OR_DEPARTMENT_OTHER)
Admission: RE | Admit: 2016-03-22 | Discharge: 2016-03-22 | Disposition: A | Discharge status: Home / Self Care | Payer: Federal, State, Local not specified - PPO | Source: Ambulatory Visit | Attending: Family Medicine | Admitting: Family Medicine

## 2016-03-22 DIAGNOSIS — R0602 Shortness of breath: Secondary | ICD-10-CM

## 2016-03-22 DIAGNOSIS — J9801 Acute bronchospasm: Secondary | ICD-10-CM | POA: Diagnosis not present

## 2016-03-22 DIAGNOSIS — R058 Other specified cough: Secondary | ICD-10-CM

## 2016-03-22 DIAGNOSIS — R05 Cough: Secondary | ICD-10-CM | POA: Insufficient documentation

## 2016-04-05 ENCOUNTER — Other Ambulatory Visit: Payer: Self-pay | Admitting: Family Medicine

## 2016-05-01 ENCOUNTER — Ambulatory Visit (INDEPENDENT_AMBULATORY_CARE_PROVIDER_SITE_OTHER): Payer: Federal, State, Local not specified - PPO | Admitting: Internal Medicine

## 2016-05-01 ENCOUNTER — Encounter: Payer: Self-pay | Admitting: Internal Medicine

## 2016-05-01 VITALS — BP 124/68 | HR 76 | Temp 98.0°F | Resp 14 | Ht 69.0 in | Wt 222.4 lb

## 2016-05-01 DIAGNOSIS — J4521 Mild intermittent asthma with (acute) exacerbation: Secondary | ICD-10-CM | POA: Diagnosis not present

## 2016-05-01 NOTE — Progress Notes (Signed)
Subjective:    Patient ID: Juan Velazquez, male    DOB: 1965-11-09, 50 y.o.   MRN: [id number]  DOS:  05/01/2016 Type of visit - description : Acute visit Interval history: Sx started 5 days ago--since then is having a dry cough, worse at night, worse with exertion. For the last couple of days he has noted some yellowish sputum. Has a history of asthma, has not hear any wheezing. He is concerned because his wife starts chemotherapy today.   Review of Systems  Denies fever chills Nose is a little hoarse No sinus pain, congestion or nasal discharge. No GERD symptoms. Past Medical History:  Diagnosis Date  . Allergy   . Asthma   . Gastric polyp 08/06/2010  . GERD (gastroesophageal reflux disease)   . Hyperlipidemia   . Hyperplastic colonic polyp 09/01/2009    Past Surgical History:  Procedure Laterality Date  . ACHILLES TENDON REPAIR Right 10/2011    Social History   Social History  . Marital status: Married    Spouse name: N/A  . Number of children: 0  . Years of education: N/A   Occupational History  . dept [employer] --w/s [employer]   Social History Main Topics  . Smoking status: Never Smoker  . Smokeless tobacco: Never Used  . Alcohol use 0.0 oz/week     Comment: rare  . Drug use: No  . Sexual activity: Yes    Partners: Female   Other Topics Concern  . Not on file   Social History Narrative   Exercise-- 3x a week        Medication List       Accurate as of 05/01/16 11:59 PM. Always use your most recent med list.          atorvastatin 40 MG tablet Commonly known as:  LIPITOR TAKE 1 TABLET BY MOUTH AT BEDTIME   diclofenac 75 MG EC tablet Commonly known as:  VOLTAREN Take 1 tablet by mouth as needed.   levocetirizine 5 MG tablet Commonly known as:  XYZAL TAKE 1 TABLET BY MOUTH IN THE EVENING   PROAIR HFA 108 (90 Base) MCG/ACT inhaler Generic drug:  albuterol INHALE 2 PUFFS INTO THE LUNGS EVERY 6 HOURS AS NEEDED          Objective:   Physical Exam BP 124/68 (BP Location: Left Arm, Patient Position: Sitting, Cuff Size: Normal)   Pulse 76   Temp 98 F (36.7 C) (Oral)   Resp 14   Ht 5\' 9"  (1.753 m)   Wt 222 lb 6 oz (100.9 kg)   SpO2 97%   BMI 32.84 kg/m  General:   Well developed, well nourished . NAD.  HEENT:  Normocephalic . Face symmetric, atraumatic. Right TM normal, left TM slightly bulge. Nose not congested, sinuses not TTP Lungs:  CTA B Normal respiratory effort, no intercostal retractions, no accessory muscle use. Heart: RRR,  no murmur.  No pretibial edema bilaterally  Skin: Not pale. Not jaundice Neurologic:  alert & oriented X3.  Speech normal, gait appropriate for age and unassisted Psych--  Cognition and judgment appear intact.  Cooperative with normal attention span and concentration.  Behavior appropriate. No anxious or depressed appearing.      Assessment & Plan:    50 year old gentleman with history of asthma, GERD, hyperlipidemia, allergies, presents with: Cough: As described above, suspect related to asthma. Doubt he has a major infection process going on. Recommend albuterol twice a day for a week then as  needed, Mucinex DM, call if not better. We could consider Symbicort for few days or  antibiotics if he develops other symptoms.

## 2016-05-01 NOTE — Progress Notes (Signed)
Pre visit review using our clinic review tool, if applicable. No additional management support is needed unless otherwise documented below in the visit note. 

## 2016-05-01 NOTE — Patient Instructions (Signed)
  For cough:  Take Mucinex DM twice a day as needed until better  Use albuterol 2 puffs in the morning and 2 before bedtime for the next week, then as needed only    Call if not gradually better over the next  10 days  Call anytime if the symptoms are severe

## 2016-05-02 ENCOUNTER — Encounter: Payer: Self-pay | Admitting: Family Medicine

## 2016-05-02 ENCOUNTER — Ambulatory Visit (INDEPENDENT_AMBULATORY_CARE_PROVIDER_SITE_OTHER): Payer: Federal, State, Local not specified - PPO | Admitting: Family Medicine

## 2016-05-02 VITALS — BP 112/68 | HR 98 | Temp 98.0°F | Resp 16 | Ht 69.0 in | Wt 221.6 lb

## 2016-05-02 DIAGNOSIS — J209 Acute bronchitis, unspecified: Secondary | ICD-10-CM | POA: Diagnosis not present

## 2016-05-02 DIAGNOSIS — E785 Hyperlipidemia, unspecified: Secondary | ICD-10-CM | POA: Diagnosis not present

## 2016-05-02 LAB — COMPREHENSIVE METABOLIC PANEL
ALBUMIN: 4.1 g/dL (ref 3.5–5.2)
ALT: 27 U/L (ref 0–53)
AST: 26 U/L (ref 0–37)
Alkaline Phosphatase: 76 U/L (ref 39–117)
BILIRUBIN TOTAL: 0.5 mg/dL (ref 0.2–1.2)
BUN: 13 mg/dL (ref 6–23)
CALCIUM: 9.3 mg/dL (ref 8.4–10.5)
CO2: 28 meq/L (ref 19–32)
Chloride: 107 mEq/L (ref 96–112)
Creatinine, Ser: 1.21 mg/dL (ref 0.40–1.50)
GFR: 81.68 mL/min (ref >60.00)
Glucose, Bld: 90 mg/dL (ref 70–99)
Potassium: 3.9 mEq/L (ref 3.5–5.1)
Sodium: 140 mEq/L (ref 135–145)
Total Protein: 7 g/dL (ref 6.0–8.3)

## 2016-05-02 LAB — LIPID PANEL
CHOL/HDL RATIO: 3
CHOLESTEROL: 182 mg/dL (ref 0–200)
HDL: 57 mg/dL (ref >39.00)
LDL CALC: 106 mg/dL — AB (ref 0–99)
NonHDL: 124.5
TRIGLYCERIDES: 92 mg/dL (ref 0.0–149.0)
VLDL: 18.4 mg/dL (ref 0.0–40.0)

## 2016-05-02 MED ORDER — AZITHROMYCIN 250 MG PO TABS: ORAL_TABLET | ORAL | 0 refills | Status: DC

## 2016-05-02 MED ORDER — ATORVASTATIN CALCIUM 40 MG PO TABS: 40.0000 mg | ORAL_TABLET | Freq: Every day | ORAL | 5 refills | Status: DC

## 2016-05-02 MED FILL — AZITHROMYCIN 250 MG TABLET: 250 | 5 days supply | Qty: 6 | Fill #0

## 2016-05-02 NOTE — Patient Instructions (Signed)

## 2016-05-02 NOTE — Progress Notes (Signed)
Patient ID: Juan Velazquez, male    DOB: [birth year]  Age: 50 y.o. MRN: [id number]    Subjective:  Subjective  HPI DANTAVIUS BOTTA presents for c/o productive cough since Sunday night.  No fever, no nasal / sinus pressure.  Had flu shot Friday.   No other symptoms.  Tried dayquil/ nyquil / alka seltzer  Used inhaler last night and this am.  His wife started chemo yesterday and onc is concerned that she may catch something.     Review of Systems  Constitutional: Positive for chills. Negative for fever.  HENT: Positive for congestion. Negative for postnasal drip, rhinorrhea and sinus pressure.   Respiratory: Positive for cough, chest tightness, shortness of breath and wheezing.   Cardiovascular: Negative for chest pain, palpitations and leg swelling.  Allergic/Immunologic: Negative for environmental allergies.    History Past Medical History:  Diagnosis Date  . Allergy   . Asthma   . Gastric polyp 08/06/2010  . GERD (gastroesophageal reflux disease)   . Hyperlipidemia   . Hyperplastic colonic polyp 09/01/2009    He has a past surgical history that includes Achilles tendon repair (Right, 10/2011).   His family history includes Colon cancer in his maternal aunt; Diabetes in his brother, mother, and sister; Diabetes type II in his brother; Heart disease in his father; Hypertension in his brother and mother; Kidney disease in his mother.He reports that he has never smoked. He has never used smokeless tobacco. He reports that he drinks alcohol. He reports that he does not use drugs.  Current Outpatient Prescriptions on File Prior to Visit  Medication Sig Dispense Refill  . diclofenac (VOLTAREN) 75 MG EC tablet Take 1 tablet by mouth as needed.  1  . levocetirizine (XYZAL) 5 MG tablet TAKE 1 TABLET BY MOUTH IN THE EVENING 30 tablet 6  . PROAIR HFA 108 (90 Base) MCG/ACT inhaler INHALE 2 PUFFS INTO THE LUNGS EVERY 6 HOURS AS NEEDED 8.5 Inhaler 5   Current Facility-Administered  Medications on File Prior to Visit  Medication Dose Route Frequency Provider Last Rate Last Dose  . methylPREDNISolone acetate (DEPO-MEDROL) injection 40 mg  40 mg Intramuscular Once Ann Held, DO         Objective:  Objective  Physical Exam  Constitutional: He is oriented to person, place, and time. He appears well-developed and well-nourished.  HENT:  Right Ear: External ear normal.  Left Ear: External ear normal.  + PND + errythema  Eyes: Conjunctivae are normal. Right eye exhibits no discharge. Left eye exhibits no discharge.  Neck: Normal range of motion. Neck supple.  Cardiovascular: Normal rate, regular rhythm and normal heart sounds.   No murmur heard. Pulmonary/Chest: Effort normal. No respiratory distress. He has decreased breath sounds. He has no wheezes. He has no rales. He exhibits no tenderness.  Musculoskeletal: He exhibits no edema.  Lymphadenopathy:    He has no cervical adenopathy.  Neurological: He is alert and oriented to person, place, and time.  Psychiatric: He has a normal mood and affect. His behavior is normal. Judgment and thought content normal.  Nursing note and vitals reviewed.  BP 112/68 (BP Location: Left Arm, Patient Position: Sitting, Cuff Size: Large)   Pulse 98   Temp 98 F (36.7 C) (Oral)   Resp 16   Ht 5\' 9"  (1.753 m)   Wt 221 lb 9.6 oz (100.5 kg)   SpO2 98%   BMI 32.72 kg/m  Wt Readings from Last 3 Encounters:  05/02/16  221 lb 9.6 oz (100.5 kg)  05/01/16 222 lb 6 oz (100.9 kg)  03/21/16 229 lb 6.4 oz (104.1 kg)     Lab Results  Component Value Date   WBC 5.5 10/24/2015   HGB 14.6 10/24/2015   HCT 44.5 10/24/2015   PLT 211.0 10/24/2015   GLUCOSE 91 08/22/2015   CHOL 224 (H) 08/22/2015   TRIG 105.0 08/22/2015   HDL 54.70 08/22/2015   LDLDIRECT 133.7 07/01/2012   LDLCALC 149 (H) 08/22/2015   ALT 30 08/22/2015   AST 33 08/22/2015   NA 140 08/22/2015   K 4.1 08/22/2015   CL 104 08/22/2015   CREATININE 1.40  08/22/2015   BUN 13 08/22/2015   CO2 31 08/22/2015   TSH 1.75 08/22/2015   PSA 1.84 08/22/2015   MICROALBUR <0.7 07/26/2014    Dg Chest 2 View  Result Date: 03/22/2016 CLINICAL DATA:  Cough and slight chest tightness began yesterday. Symptoms were improved with a breathing treatment. History of asthma EXAM: CHEST  2 VIEW COMPARISON:  PA and lateral chest x-ray of August 13, 2010 FINDINGS: The lungs are adequately inflated. There is no focal infiltrate. The perihilar lung markings are coarse but not greatly changed from the previous study. The heart and pulmonary vascularity are normal. The mediastinum is normal in width. There is no pleural effusion. There is moderate multilevel degenerative disc space narrowing of the thoracic spine which is stable. IMPRESSION: There is no active cardiopulmonary disease. Electronically Signed   By: David  Martinique M.D.   On: 03/22/2016 09:27     Assessment & Plan:  Plan  I have changed Mr. Hoshino atorvastatin. I am also having him start on azithromycin. Additionally, I am having him maintain his levocetirizine, diclofenac, and PROAIR HFA. We will continue to administer methylPREDNISolone acetate.  Meds ordered this encounter  Medications  . azithromycin (ZITHROMAX Z-PAK) 250 MG tablet    Sig: As directed    Dispense:  6 each    Refill:  0  . atorvastatin (LIPITOR) 40 MG tablet    Sig: Take 1 tablet (40 mg total) by mouth at bedtime.    Dispense:  30 tablet    Refill:  5    Problem List Items Addressed This Visit    None    Visit Diagnoses    Acute bronchitis, unspecified organism    -  Primary   Relevant Medications   azithromycin (ZITHROMAX Z-PAK) 250 MG tablet   Hyperlipidemia LDL goal <100       Relevant Medications   atorvastatin (LIPITOR) 40 MG tablet   Other Relevant Orders   Lipid panel   Comprehensive metabolic panel   CBC with Differential/Platelet      Follow-up: Return in about 6 months (around 10/31/2016) for  hyperlipidemia.  Ann Held, DO

## 2016-05-03 DIAGNOSIS — H1031 Unspecified acute conjunctivitis, right eye: Secondary | ICD-10-CM | POA: Diagnosis not present

## 2016-05-03 LAB — CBC WITH DIFFERENTIAL/PLATELET
BASOS ABS: 0 10*3/uL (ref 0.0–0.1)
Basophils Relative: 0.6 % (ref 0.0–3.0)
EOS ABS: 0.2 10*3/uL (ref 0.0–0.7)
Eosinophils Relative: 3 % (ref 0.0–5.0)
HEMATOCRIT: 43.1 % (ref 39.0–52.0)
HEMOGLOBIN: 14.4 g/dL (ref 13.0–17.0)
LYMPHS PCT: 19.6 % (ref 12.0–46.0)
Lymphs Abs: 1.2 10*3/uL (ref 0.7–4.0)
MCHC: 33.5 g/dL (ref 30.0–36.0)
MCV: 88.8 fl (ref 78.0–100.0)
MONOS PCT: 8.4 % (ref 3.0–12.0)
Monocytes Absolute: 0.5 10*3/uL (ref 0.1–1.0)
NEUTROS ABS: 4.1 10*3/uL (ref 1.4–7.7)
Neutrophils Relative %: 68.4 % (ref 43.0–77.0)
Platelets: 222 10*3/uL (ref 150.0–400.0)
RBC: 4.86 Mil/uL (ref 4.22–5.81)
RDW: 12.8 % (ref 11.5–15.5)
WBC: 6 10*3/uL (ref 4.0–10.5)

## 2016-06-03 DIAGNOSIS — K080 Exfoliation of teeth due to systemic causes: Secondary | ICD-10-CM | POA: Diagnosis not present

## 2016-08-01 DIAGNOSIS — F4323 Adjustment disorder with mixed anxiety and depressed mood: Secondary | ICD-10-CM | POA: Diagnosis not present

## 2016-08-19 ENCOUNTER — Encounter: Payer: Self-pay | Admitting: Family Medicine

## 2016-08-19 ENCOUNTER — Ambulatory Visit (INDEPENDENT_AMBULATORY_CARE_PROVIDER_SITE_OTHER): Payer: Federal, State, Local not specified - PPO | Admitting: Family Medicine

## 2016-08-19 ENCOUNTER — Ambulatory Visit (HOSPITAL_BASED_OUTPATIENT_CLINIC_OR_DEPARTMENT_OTHER)
Admission: RE | Admit: 2016-08-19 | Discharge: 2016-08-19 | Disposition: A | Discharge status: Home / Self Care | Payer: Federal, State, Local not specified - PPO | Source: Ambulatory Visit | Attending: Family Medicine | Admitting: Family Medicine

## 2016-08-19 VITALS — BP 114/74 | HR 63 | Ht 69.0 in | Wt 215.0 lb

## 2016-08-19 DIAGNOSIS — G8929 Other chronic pain: Secondary | ICD-10-CM

## 2016-08-19 DIAGNOSIS — M25561 Pain in right knee: Secondary | ICD-10-CM | POA: Insufficient documentation

## 2016-08-19 NOTE — Patient Instructions (Signed)
Your x-rays look great. Your knee pain is due to a medial meniscus tear. These usually heal with conservative treatment. Tylenol 500 mg 1-2 tabs three times a day if needed for pain. Ok to take aleve or ibuprofen in addition to this if needed. Do home exercises on days you don't go to physical therapy. Injection, MRI are considerations if you aren't improving as expected. Follow up with me in 6 weeks for reevaluation.

## 2016-08-22 NOTE — Progress Notes (Signed)
PCP: Ann Held, DO  Subjective:   HPI: Patient is a 51 y.o. male here for right knee pain.  12/29/15: Patient denies known injury. He recalls about 2 weeks ago doing leg workout with squats, presses, lunges, leg extensions then this past Sunday did the bike. But pain started on Tuesday without change in routine or increase in activity level. Pain anterior knee at 1/10 level but up to 8/10 and sharp with walking. Difficulty sleeping at night. + swelling. No skin changes, numbness.  08/20/16: Patient reports he has continued to have pain medial aspect of right knee. Pain is 0/10 now but up to 10/10 if he fully bends his right knee, sharp and stiff. Not playing softball or lifting heavy weights now. Wrapping, compression sleeve tend to help. Landing with a box jump bothers him. Ok with leg press and curls. No skin changes, numbness.  Past Medical History:  Diagnosis Date  . Allergy   . Asthma   . Gastric polyp 08/06/2010  . GERD (gastroesophageal reflux disease)   . Hyperlipidemia   . Hyperplastic colonic polyp 09/01/2009    Current Outpatient Prescriptions on File Prior to Visit  Medication Sig Dispense Refill  . atorvastatin (LIPITOR) 40 MG tablet Take 1 tablet (40 mg total) by mouth at bedtime. 30 tablet 5  . azithromycin (ZITHROMAX Z-PAK) 250 MG tablet As directed 6 each 0  . diclofenac (VOLTAREN) 75 MG EC tablet Take 1 tablet by mouth as needed.  1  . levocetirizine (XYZAL) 5 MG tablet TAKE 1 TABLET BY MOUTH IN THE EVENING 30 tablet 6  . PROAIR HFA 108 (90 Base) MCG/ACT inhaler INHALE 2 PUFFS INTO THE LUNGS EVERY 6 HOURS AS NEEDED 8.5 Inhaler 5   Current Facility-Administered Medications on File Prior to Visit  Medication Dose Route Frequency Provider Last Rate Last Dose  . methylPREDNISolone acetate (DEPO-MEDROL) injection 40 mg  40 mg Intramuscular Once Ann Held, DO        Past Surgical History:  Procedure Laterality Date  . ACHILLES TENDON  REPAIR Right 10/2011    No Known Allergies  Social History   Social History  . Marital status: Married    Spouse name: N/A  . Number of children: 0  . Years of education: N/A   Occupational History  . dept [employer] --w/s [employer]   Social History Main Topics  . Smoking status: Never Smoker  . Smokeless tobacco: Never Used  . Alcohol use 0.0 oz/week     Comment: rare  . Drug use: No  . Sexual activity: Yes    Partners: Female   Other Topics Concern  . Not on file   Social History Narrative   Exercise-- 3x a week    Family History  Problem Relation Age of Onset  . Diabetes Mother   . Hypertension Mother   . Kidney disease Mother   . Diabetes type II Brother   . Hypertension Brother   . Diabetes Brother   . Diabetes Sister   . Heart disease Father     defibrillator  . Colon cancer Maternal Aunt     BP 114/74   Pulse 63   Ht 5\' 9"  (1.753 m)   Wt 215 lb (97.5 kg)   BMI 31.75 kg/m   Review of Systems: See HPI above.    Objective:  Physical Exam:  Gen: NAD, comfortable in exam room  Right knee: Mild effusion.  No bruising, other deformity.  No warmth, redness. TTP  medial joint line.  No other tenderness. FROM with mild pain on full flexion. Negative ant/post drawers. Negative valgus/varus testing. Negative lachmanns. Positive mcmurrays, apleys.  Negative patellar apprehension. NV intact distally.  Left knee: FROM without pain.    Assessment & Plan:  1. Right knee pain - associated effusion.  Independently reviewed radiographs and no evidence arthropathy or other abnormalities.  Consistent with medial meniscus tear.  Tylenol if needed, start physical therapy and home exercises.  Consider injection, MRI if not improving.  F/u in 6 weeks.

## 2016-08-22 NOTE — Assessment & Plan Note (Signed)
associated effusion.  Independently reviewed radiographs and no evidence arthropathy or other abnormalities.  Consistent with medial meniscus tear.  Tylenol if needed, start physical therapy and home exercises.  Consider injection, MRI if not improving.  F/u in 6 weeks.

## 2016-09-23 ENCOUNTER — Ambulatory Visit: Payer: Federal, State, Local not specified - PPO | Admitting: Family Medicine

## 2016-09-23 ENCOUNTER — Encounter: Payer: Self-pay | Admitting: Family Medicine

## 2016-09-23 ENCOUNTER — Ambulatory Visit (INDEPENDENT_AMBULATORY_CARE_PROVIDER_SITE_OTHER): Payer: Federal, State, Local not specified - PPO | Admitting: Family Medicine

## 2016-09-23 VITALS — BP 117/77 | HR 60 | Temp 98.7°F | Ht 69.0 in | Wt 220.0 lb

## 2016-09-23 DIAGNOSIS — Z0000 Encounter for general adult medical examination without abnormal findings: Secondary | ICD-10-CM

## 2016-09-23 DIAGNOSIS — E785 Hyperlipidemia, unspecified: Secondary | ICD-10-CM

## 2016-09-23 LAB — TSH: TSH: 1.81 u[IU]/mL (ref 0.35–4.50)

## 2016-09-23 LAB — LIPID PANEL
Cholesterol: 211 mg/dL — ABNORMAL HIGH (ref 0–200)
HDL: 60.3 mg/dL (ref >39.00)
LDL Cholesterol: 133 mg/dL — ABNORMAL HIGH (ref 0–99)
NonHDL: 150.21
TRIGLYCERIDES: 85 mg/dL (ref 0.0–149.0)
Total CHOL/HDL Ratio: 3
VLDL: 17 mg/dL (ref 0.0–40.0)

## 2016-09-23 LAB — POC URINALSYSI DIPSTICK (AUTOMATED)
Bilirubin, UA: NEGATIVE
Blood, UA: NEGATIVE
GLUCOSE UA: NEGATIVE
Ketones, UA: NEGATIVE
Leukocytes, UA: NEGATIVE
NITRITE UA: NEGATIVE
PROTEIN UA: NEGATIVE
Spec Grav, UA: 1.02 (ref 1.010–1.025)
UROBILINOGEN UA: 0.2 U/dL
pH, UA: 6 (ref 5.0–8.0)

## 2016-09-23 LAB — COMPREHENSIVE METABOLIC PANEL
ALT: 23 U/L (ref 0–53)
AST: 26 U/L (ref 0–37)
Albumin: 4.4 g/dL (ref 3.5–5.2)
Alkaline Phosphatase: 61 U/L (ref 39–117)
BUN: 10 mg/dL (ref 6–23)
CALCIUM: 9.4 mg/dL (ref 8.4–10.5)
CHLORIDE: 105 meq/L (ref 96–112)
CO2: 29 meq/L (ref 19–32)
Creatinine, Ser: 1.18 mg/dL (ref 0.40–1.50)
GFR: 83.94 mL/min (ref >60.00)
Glucose, Bld: 91 mg/dL (ref 70–99)
Potassium: 3.9 mEq/L (ref 3.5–5.1)
Sodium: 138 mEq/L (ref 135–145)
Total Bilirubin: 0.6 mg/dL (ref 0.2–1.2)
Total Protein: 7.2 g/dL (ref 6.0–8.3)

## 2016-09-23 LAB — CBC
HCT: 42.6 % (ref 39.0–52.0)
HEMOGLOBIN: 14 g/dL (ref 13.0–17.0)
MCHC: 32.9 g/dL (ref 30.0–36.0)
MCV: 89.2 fl (ref 78.0–100.0)
PLATELETS: 212 10*3/uL (ref 150.0–400.0)
RBC: 4.77 Mil/uL (ref 4.22–5.81)
RDW: 12.9 % (ref 11.5–15.5)
WBC: 3.6 10*3/uL — ABNORMAL LOW (ref 4.0–10.5)

## 2016-09-23 NOTE — Assessment & Plan Note (Signed)
See avs ghm utd  Check labs 

## 2016-09-23 NOTE — Progress Notes (Signed)
Patient ID: Juan Velazquez, male   DOB: 11-Jan-1966, 51 y.o.   MRN: [id number]   Subjective:   Patient ID: Juan Velazquez, male    DOB: 1966-04-20, 51 y.o.   MRN: [id number]  Chief Complaint  Patient presents with  . Annual Exam    HPI  Patient is in today for an annual examination. Patient states he is currently seeing an Orthopedist for a meniscus tear. Patient has a Hx of GERD, hyperlipidemia, fatigue, asthma. Patient has no additional concerns noted at this time.  Patient Care Team: Carollee Herter, Alferd Apa, DO as PCP - General   Past Medical History:  Diagnosis Date  . Allergy   . Asthma   . Gastric polyp 08/06/2010  . GERD (gastroesophageal reflux disease)   . Hyperlipidemia   . Hyperplastic colonic polyp 09/01/2009    Past Surgical History:  Procedure Laterality Date  . ACHILLES TENDON REPAIR Right 10/2011    Family History  Problem Relation Age of Onset  . Diabetes Mother   . Hypertension Mother   . Kidney disease Mother   . Diabetes type II Brother   . Hypertension Brother   . Diabetes Brother   . Diabetes Sister   . Heart disease Father     defibrillator  . Colon cancer Maternal Aunt   . Cancer Other     Social History   Social History  . Marital status: Married    Spouse name: N/A  . Number of children: 0  . Years of education: N/A   Occupational History  . dept [employer] --w/s [employer]   Social History Main Topics  . Smoking status: Never Smoker  . Smokeless tobacco: Never Used  . Alcohol use 0.0 oz/week     Comment: rare  . Drug use: No  . Sexual activity: Yes    Partners: Female   Other Topics Concern  . Not on file   Social History Narrative   Exercise-- 3x a week    Outpatient Medications Prior to Visit  Medication Sig Dispense Refill  . atorvastatin (LIPITOR) 40 MG tablet Take 1 tablet (40 mg total) by mouth at bedtime. 30 tablet 5  . diclofenac (VOLTAREN) 75 MG EC tablet Take 1 tablet by mouth as needed.  1  .  levocetirizine (XYZAL) 5 MG tablet TAKE 1 TABLET BY MOUTH IN THE EVENING 30 tablet 6  . PROAIR HFA 108 (90 Base) MCG/ACT inhaler INHALE 2 PUFFS INTO THE LUNGS EVERY 6 HOURS AS NEEDED 8.5 Inhaler 5  . azithromycin (ZITHROMAX Z-PAK) 250 MG tablet As directed 6 each 0   Facility-Administered Medications Prior to Visit  Medication Dose Route Frequency Provider Last Rate Last Dose  . methylPREDNISolone acetate (DEPO-MEDROL) injection 40 mg  40 mg Intramuscular Once [employer], Yvonne R, DO        No Known Allergies  Review of Systems  Constitutional: Negative for fever and malaise/fatigue.  HENT: Negative for congestion.   Eyes: Negative for blurred vision.  Respiratory: Negative for cough and shortness of breath.   Cardiovascular: Negative for chest pain, palpitations and leg swelling.  Gastrointestinal: Negative for vomiting.  Musculoskeletal: Negative for back pain.  Skin: Negative for rash.  Neurological: Negative for loss of consciousness and headaches.       Objective:    Physical Exam  Constitutional: He is oriented to person, place, and time. He appears well-developed and well-nourished. No distress.  HENT:  Head: Normocephalic and atraumatic.  Right Ear: External ear normal.  Left Ear: External ear normal.  Nose: Nose normal.  Mouth/Throat: Oropharynx is clear and moist. No oropharyngeal exudate.  Eyes: Conjunctivae and EOM are normal. Pupils are equal, round, and reactive to light. Right eye exhibits no discharge. Left eye exhibits no discharge.  Neck: Normal range of motion. Neck supple. No JVD present. No thyromegaly present.  Cardiovascular: Normal rate, regular rhythm and intact distal pulses.  Exam reveals no gallop and no friction rub.   No murmur heard. Pulmonary/Chest: Effort normal and breath sounds normal. No respiratory distress. He has no wheezes. He has no rales. He exhibits no tenderness.  Abdominal: Soft. Bowel sounds are normal. He exhibits no distension  and no mass. There is no tenderness. There is no rebound and no guarding.  Genitourinary: Rectum normal, prostate normal and penis normal. Rectal exam shows guaiac negative stool. No penile tenderness.  Musculoskeletal: Normal range of motion. He exhibits no edema, tenderness or deformity.  Lymphadenopathy:    He has no cervical adenopathy.  Neurological: He is alert and oriented to person, place, and time. He displays normal reflexes. He exhibits normal muscle tone.  Skin: Skin is warm and dry. No rash noted. He is not diaphoretic. No erythema. No pallor.  Psychiatric: He has a normal mood and affect. His behavior is normal. Judgment and thought content normal.  Nursing note and vitals reviewed.   BP 117/77 (BP Location: Left Arm, Patient Position: Sitting, Cuff Size: Normal)   Pulse 60   Temp 98.7 F (37.1 C) (Oral)   Ht 5\' 9"  (1.753 m)   Wt 220 lb (99.8 kg)   SpO2 100% Comment: RA  BMI 32.49 kg/m  Wt Readings from Last 3 Encounters:  09/23/16 220 lb (99.8 kg)  08/19/16 215 lb (97.5 kg)  05/02/16 221 lb 9.6 oz (100.5 kg)   BP Readings from Last 3 Encounters:  09/23/16 117/77  08/19/16 114/74  05/02/16 112/68     Immunization History  Administered Date(s) Administered  . Hep A / Hep B 06/17/2011, 07/18/2011  . Influenza-Unspecified 04/26/2016  . Td 10/02/2005  . Tdap 05/20/2006    Health Maintenance  Topic Date Due  . HIV Screening  06/19/1981  . TETANUS/TDAP  12/24/2016 (Originally 05/20/2016)  . INFLUENZA VACCINE  12/18/2016  . COLONOSCOPY  09/02/2022    Lab Results  Component Value Date   WBC 6.0 05/02/2016   HGB 14.4 05/02/2016   HCT 43.1 05/02/2016   PLT 222.0 05/02/2016   GLUCOSE 90 05/02/2016   CHOL 182 05/02/2016   TRIG 92.0 05/02/2016   HDL 57.00 05/02/2016   LDLDIRECT 133.7 07/01/2012   LDLCALC 106 (H) 05/02/2016   ALT 27 05/02/2016   AST 26 05/02/2016   NA 140 05/02/2016   K 3.9 05/02/2016   CL 107 05/02/2016   CREATININE 1.21 05/02/2016   BUN  13 05/02/2016   CO2 28 05/02/2016   TSH 1.75 08/22/2015   PSA 1.84 08/22/2015   MICROALBUR <0.7 07/26/2014    Lab Results  Component Value Date   TSH 1.75 08/22/2015   Lab Results  Component Value Date   WBC 6.0 05/02/2016   HGB 14.4 05/02/2016   HCT 43.1 05/02/2016   MCV 88.8 05/02/2016   PLT 222.0 05/02/2016   Lab Results  Component Value Date   NA 140 05/02/2016   K 3.9 05/02/2016   CO2 28 05/02/2016   GLUCOSE 90 05/02/2016   BUN 13 05/02/2016   CREATININE 1.21 05/02/2016   BILITOT 0.5 05/02/2016  ALKPHOS 76 05/02/2016   AST 26 05/02/2016   ALT 27 05/02/2016   PROT 7.0 05/02/2016   ALBUMIN 4.1 05/02/2016   CALCIUM 9.3 05/02/2016   GFR 81.68 05/02/2016   Lab Results  Component Value Date   CHOL 182 05/02/2016   Lab Results  Component Value Date   HDL 57.00 05/02/2016   Lab Results  Component Value Date   LDLCALC 106 (H) 05/02/2016   Lab Results  Component Value Date   TRIG 92.0 05/02/2016   Lab Results  Component Value Date   CHOLHDL 3 05/02/2016   No results found for: HGBA1C       Assessment & Plan:   Problem List Items Addressed This Visit      Unprioritized   Hyperlipidemia    Tolerating statin, encouraged heart healthy diet, avoid trans fats, minimize simple carbs and saturated fats. Increase exercise as tolerated      Preventative health care - Primary    See avs ghm utd Check labs      Relevant Orders   CBC   Comprehensive metabolic panel   Lipid panel   TSH   PSA   POCT Urinalysis Dipstick (Automated) (Completed)    Other Visit Diagnoses    Hyperlipidemia LDL goal <100       Relevant Orders   Comprehensive metabolic panel   Lipid panel   PSA   POCT Urinalysis Dipstick (Automated) (Completed)      I have discontinued Mr. Whiteley azithromycin. I am also having him maintain his levocetirizine, diclofenac, PROAIR HFA, and atorvastatin. We will continue to administer methylPREDNISolone acetate.  No orders of  the defined types were placed in this encounter.   Ann Held, DO

## 2016-09-23 NOTE — Progress Notes (Signed)
Pre visit review using our clinic review tool, if applicable. No additional management support is needed unless otherwise documented below in the visit note. 

## 2016-09-23 NOTE — Patient Instructions (Signed)
Preventive Care 40-64 Years, Male Preventive care refers to lifestyle choices and visits with your health care provider that can promote health and wellness. What does preventive care include?  A yearly physical exam. This is also called an annual well check.  Dental exams once or twice a year.  Routine eye exams. Ask your health care provider how often you should have your eyes checked.  Personal lifestyle choices, including:  Daily care of your teeth and gums.  Regular physical activity.  Eating a healthy diet.  Avoiding tobacco and drug use.  Limiting alcohol use.  Practicing safe sex.  Taking low-dose aspirin every day starting at age 50. What happens during an annual well check? The services and screenings done by your health care provider during your annual well check will depend on your age, overall health, lifestyle risk factors, and family history of disease. Counseling  Your health care provider may ask you questions about your:  Alcohol use.  Tobacco use.  Drug use.  Emotional well-being.  Home and relationship well-being.  Sexual activity.  Eating habits.  Work and work environment. Screening  You may have the following tests or measurements:  Height, weight, and BMI.  Blood pressure.  Lipid and cholesterol levels. These may be checked every 5 years, or more frequently if you are over 50 years old.  Skin check.  Lung cancer screening. You may have this screening every year starting at age 55 if you have a 30-pack-year history of smoking and currently smoke or have quit within the past 15 years.  Fecal occult blood test (FOBT) of the stool. You may have this test every year starting at age 50.  Flexible sigmoidoscopy or colonoscopy. You may have a sigmoidoscopy every 5 years or a colonoscopy every 10 years starting at age 50.  Prostate cancer screening. Recommendations will vary depending on your family history and other risks.  Hepatitis C  blood test.  Hepatitis B blood test.  Sexually transmitted disease (STD) testing.  Diabetes screening. This is done by checking your blood sugar (glucose) after you have not eaten for a while (fasting). You may have this done every 1-3 years. Discuss your test results, treatment options, and if necessary, the need for more tests with your health care provider. Vaccines  Your health care provider may recommend certain vaccines, such as:  Influenza vaccine. This is recommended every year.  Tetanus, diphtheria, and acellular pertussis (Tdap, Td) vaccine. You may need a Td booster every 10 years.  Varicella vaccine. You may need this if you have not been vaccinated.  Zoster vaccine. You may need this after age 60.  Measles, mumps, and rubella (MMR) vaccine. You may need at least one dose of MMR if you were born in 1957 or later. You may also need a second dose.  Pneumococcal 13-valent conjugate (PCV13) vaccine. You may need this if you have certain conditions and have not been vaccinated.  Pneumococcal polysaccharide (PPSV23) vaccine. You may need one or two doses if you smoke cigarettes or if you have certain conditions.  Meningococcal vaccine. You may need this if you have certain conditions.  Hepatitis A vaccine. You may need this if you have certain conditions or if you travel or work in places where you may be exposed to hepatitis A.  Hepatitis B vaccine. You may need this if you have certain conditions or if you travel or work in places where you may be exposed to hepatitis B.  Haemophilus influenzae type b (Hib)   vaccine. You may need this if you have certain risk factors. Talk to your health care provider about which screenings and vaccines you need and how often you need them. This information is not intended to replace advice given to you by your health care provider. Make sure you discuss any questions you have with your health care provider. Document Released: 06/02/2015  Document Revised: 01/24/2016 Document Reviewed: 03/07/2015 Elsevier Interactive Patient Education  2017 [employer].

## 2016-09-23 NOTE — Assessment & Plan Note (Signed)
Tolerating statin, encouraged heart healthy diet, avoid trans fats, minimize simple carbs and saturated fats. Increase exercise as tolerated 

## 2016-09-26 ENCOUNTER — Other Ambulatory Visit: Payer: Self-pay | Admitting: Family Medicine

## 2016-09-26 DIAGNOSIS — R972 Elevated prostate specific antigen [PSA]: Secondary | ICD-10-CM

## 2016-09-26 DIAGNOSIS — E785 Hyperlipidemia, unspecified: Secondary | ICD-10-CM

## 2016-09-26 LAB — PSA: PSA: 2.56 ng/mL (ref 0.10–4.00)

## 2016-10-28 ENCOUNTER — Ambulatory Visit: Payer: Federal, State, Local not specified - PPO | Admitting: Family Medicine

## 2016-12-02 DIAGNOSIS — N401 Enlarged prostate with lower urinary tract symptoms: Secondary | ICD-10-CM | POA: Diagnosis not present

## 2016-12-02 DIAGNOSIS — R351 Nocturia: Secondary | ICD-10-CM | POA: Diagnosis not present

## 2016-12-02 DIAGNOSIS — R972 Elevated prostate specific antigen [PSA]: Secondary | ICD-10-CM | POA: Diagnosis not present

## 2016-12-11 DIAGNOSIS — R972 Elevated prostate specific antigen [PSA]: Secondary | ICD-10-CM | POA: Diagnosis not present

## 2016-12-23 ENCOUNTER — Telehealth: Payer: Self-pay | Admitting: *Deleted

## 2016-12-23 NOTE — Telephone Encounter (Signed)
Received Medical records from [facility], forwarded to provider/SLS 08/06

## 2016-12-26 ENCOUNTER — Other Ambulatory Visit (INDEPENDENT_AMBULATORY_CARE_PROVIDER_SITE_OTHER): Payer: Federal, State, Local not specified - PPO

## 2016-12-26 DIAGNOSIS — E785 Hyperlipidemia, unspecified: Secondary | ICD-10-CM

## 2016-12-26 LAB — COMPREHENSIVE METABOLIC PANEL
ALT: 23 U/L (ref 0–53)
AST: 27 U/L (ref 0–37)
Albumin: 4.5 g/dL (ref 3.5–5.2)
Alkaline Phosphatase: 75 U/L (ref 39–117)
BILIRUBIN TOTAL: 0.8 mg/dL (ref 0.2–1.2)
BUN: 20 mg/dL (ref 6–23)
CALCIUM: 9.5 mg/dL (ref 8.4–10.5)
CHLORIDE: 107 meq/L (ref 96–112)
CO2: 29 meq/L (ref 19–32)
Creatinine, Ser: 1.56 mg/dL — ABNORMAL HIGH (ref 0.40–1.50)
GFR: 60.76 mL/min (ref >60.00)
Glucose, Bld: 85 mg/dL (ref 70–99)
POTASSIUM: 3.9 meq/L (ref 3.5–5.1)
Sodium: 140 mEq/L (ref 135–145)
Total Protein: 7.5 g/dL (ref 6.0–8.3)

## 2016-12-26 LAB — LIPID PANEL
CHOL/HDL RATIO: 3
Cholesterol: 202 mg/dL — ABNORMAL HIGH (ref 0–200)
HDL: 60.9 mg/dL (ref >39.00)
LDL CALC: 126 mg/dL — AB (ref 0–99)
NonHDL: 141.43
TRIGLYCERIDES: 76 mg/dL (ref 0.0–149.0)
VLDL: 15.2 mg/dL (ref 0.0–40.0)

## 2016-12-27 ENCOUNTER — Other Ambulatory Visit: Payer: Federal, State, Local not specified - PPO

## 2016-12-27 ENCOUNTER — Other Ambulatory Visit: Payer: Self-pay | Admitting: Family Medicine

## 2016-12-27 ENCOUNTER — Telehealth: Payer: Self-pay | Admitting: Family Medicine

## 2016-12-27 DIAGNOSIS — N289 Disorder of kidney and ureter, unspecified: Secondary | ICD-10-CM

## 2016-12-27 DIAGNOSIS — E785 Hyperlipidemia, unspecified: Secondary | ICD-10-CM

## 2016-12-27 MED ORDER — ATORVASTATIN CALCIUM 80 MG PO TABS: 80.0000 mg | ORAL_TABLET | Freq: Every day | ORAL | 1 refills | Status: DC

## 2016-12-27 NOTE — Telephone Encounter (Signed)
Pt returned call for lab results.  CB: [phone]

## 2016-12-27 NOTE — Telephone Encounter (Signed)
See result notes. 

## 2017-01-03 ENCOUNTER — Other Ambulatory Visit (INDEPENDENT_AMBULATORY_CARE_PROVIDER_SITE_OTHER): Payer: Federal, State, Local not specified - PPO

## 2017-01-03 DIAGNOSIS — N289 Disorder of kidney and ureter, unspecified: Secondary | ICD-10-CM

## 2017-01-03 DIAGNOSIS — K080 Exfoliation of teeth due to systemic causes: Secondary | ICD-10-CM | POA: Diagnosis not present

## 2017-01-03 LAB — BASIC METABOLIC PANEL
BUN: 19 mg/dL (ref 6–23)
CO2: 29 mEq/L (ref 19–32)
CREATININE: 1.18 mg/dL (ref 0.40–1.50)
Calcium: 9.4 mg/dL (ref 8.4–10.5)
Chloride: 108 mEq/L (ref 96–112)
GFR: 83.85 mL/min (ref >60.00)
Glucose, Bld: 92 mg/dL (ref 70–99)
Potassium: 4.4 mEq/L (ref 3.5–5.1)
Sodium: 142 mEq/L (ref 135–145)

## 2017-02-24 ENCOUNTER — Ambulatory Visit: Payer: Federal, State, Local not specified - PPO | Admitting: Family Medicine

## 2017-02-24 ENCOUNTER — Encounter: Payer: Self-pay | Admitting: Medical

## 2017-02-24 ENCOUNTER — Ambulatory Visit (INDEPENDENT_AMBULATORY_CARE_PROVIDER_SITE_OTHER): Payer: Federal, State, Local not specified - PPO | Admitting: Medical

## 2017-02-24 VITALS — BP 107/66 | HR 60 | Temp 98.0°F | Resp 16 | Ht 69.0 in | Wt 220.4 lb

## 2017-02-24 DIAGNOSIS — L309 Dermatitis, unspecified: Secondary | ICD-10-CM | POA: Diagnosis not present

## 2017-02-24 MED ORDER — CLOTRIMAZOLE-BETAMETHASONE 1-0.05 % EX CREA: 1.0000 "application " | TOPICAL_CREAM | Freq: Two times a day (BID) | CUTANEOUS | 1 refills | Status: DC

## 2017-02-24 NOTE — Patient Instructions (Addendum)
For your left foot/toe region dermatitis will rx lotrisone topical med. Apply twice daily for 3 weeks.   Keep area dry. Will see if area looks improved by follow up.  If not much improved consider referral to dermatologist or podiatrist.  Follow up in 2-3 weeks or as needed

## 2017-02-24 NOTE — Progress Notes (Signed)
Subjective:    Patient ID: Juan Velazquez, male    DOB: 05-01-1966, 51 y.o.   MRN: [id number]  HPI  Pt in for 2nd left toe at the base for two months. It itches occasionally. No pain. States maybe twice a week will itch briefly. Pt not sure if wearing shoes makes it worse. He does wear dress shoes every day.   Pt has not used any treatment otc for this.  Pt always wears socks when he uses shoes.    Review of Systems  Constitutional: Negative for appetite change, diaphoresis, fatigue and fever.  Respiratory: Negative for cough, chest tightness, shortness of breath and wheezing.   Gastrointestinal: Negative for abdominal pain, constipation, nausea and vomiting.  Musculoskeletal: Negative for back pain and gait problem.  Skin: Negative for rash.       Left foot- based of second toe rash.  Neurological: Negative for dizziness and facial asymmetry.  Hematological: Negative for adenopathy. Does not bruise/bleed easily.  Psychiatric/Behavioral: Negative for behavioral problems, confusion and suicidal ideas. The patient is not nervous/anxious.      Past Medical History:  Diagnosis Date  . Allergy   . Asthma   . Gastric polyp 08/06/2010  . GERD (gastroesophageal reflux disease)   . Hyperlipidemia   . Hyperplastic colonic polyp 09/01/2009     Social History   Social History  . Marital status: Married    Spouse name: N/A  . Number of children: 0  . Years of education: N/A   Occupational History  . dept [employer] --w/s [employer]   Social History Main Topics  . Smoking status: Never Smoker  . Smokeless tobacco: Never Used  . Alcohol use 0.0 oz/week     Comment: rare  . Drug use: No  . Sexual activity: Yes    Partners: Female   Other Topics Concern  . Not on file   Social History Narrative   Exercise-- 3x a week    Past Surgical History:  Procedure Laterality Date  . ACHILLES TENDON REPAIR Right 10/2011    Family History  Problem Relation Age of  Onset  . Diabetes Mother   . Hypertension Mother   . Kidney disease Mother   . Diabetes type II Brother   . Hypertension Brother   . Diabetes Brother   . Diabetes Sister   . Heart disease Father        defibrillator  . Colon cancer Maternal Aunt   . Cancer Other     No Known Allergies  Current Outpatient Prescriptions on File Prior to Visit  Medication Sig Dispense Refill  . atorvastatin (LIPITOR) 80 MG tablet Take 1 tablet (80 mg total) by mouth daily. 90 tablet 1  . diclofenac (VOLTAREN) 75 MG EC tablet Take 1 tablet by mouth as needed.  1  . levocetirizine (XYZAL) 5 MG tablet TAKE 1 TABLET BY MOUTH IN THE EVENING 30 tablet 6  . PROAIR HFA 108 (90 Base) MCG/ACT inhaler INHALE 2 PUFFS INTO THE LUNGS EVERY 6 HOURS AS NEEDED 8.5 Inhaler 5   Current Facility-Administered Medications on File Prior to Visit  Medication Dose Route Frequency Provider Last Rate Last Dose  . methylPREDNISolone acetate (DEPO-MEDROL) injection 40 mg  40 mg Intramuscular Once [employer], Yvonne R, DO        BP 107/66   Pulse 60   Temp 98 F (36.7 C) (Oral)   Resp 16   Ht 5\' 9"  (1.753 m)   Wt 220 lb 6.4  oz (100 kg)   SpO2 100%   BMI 32.55 kg/m       Objective:   Physical Exam  General- No acute distress. Pleasant patient.  Lungs- Clear, even and unlabored. Heart- regular rate and rhythm. Neurologic- CNII- XII grossly intact.  Left- 2nd toe base of some hyperpigmentation. Mild flakiness.(area about 1.5 cm x 1 cm) No warmth, no swelling and no fluctuance.         Assessment & Plan:  For your left foot/toe region dermatitis will rx lotrisone topical med. Apply twice daily for 3 weeks.   Keep area dry. Will see if area looks improved by follow up.  If not much improved consider referral to dermatologist or podiatrist.  Follow up in 2-3 weeks or as needed  Axie Hayne, Percell Miller, [place]

## 2017-06-11 ENCOUNTER — Ambulatory Visit: Payer: Federal, State, Local not specified - PPO | Admitting: Medical

## 2017-06-11 ENCOUNTER — Encounter: Payer: Self-pay | Admitting: Internal Medicine

## 2017-06-11 ENCOUNTER — Ambulatory Visit: Payer: Federal, State, Local not specified - PPO | Admitting: Internal Medicine

## 2017-06-11 VITALS — BP 126/74 | HR 76 | Temp 98.1°F | Resp 14 | Ht 69.0 in | Wt 224.0 lb

## 2017-06-11 DIAGNOSIS — J069 Acute upper respiratory infection, unspecified: Secondary | ICD-10-CM

## 2017-06-11 DIAGNOSIS — J029 Acute pharyngitis, unspecified: Secondary | ICD-10-CM | POA: Diagnosis not present

## 2017-06-11 LAB — POCT RAPID STREP A (OFFICE): Rapid Strep A Screen: NEGATIVE

## 2017-06-11 NOTE — Patient Instructions (Signed)
Rest, fluids , tylenol  If cough:  Take Mucinex DM twice a day as needed until better  If  nasal congestion: Use OTC Nasocort or Flonase : 2 nasal sprays on each side of the nose in the morning until you feel better   Call if not gradually better over the next  10 days  Call anytime if the symptoms are severe

## 2017-06-11 NOTE — Progress Notes (Signed)
Subjective:    Patient ID: Juan Velazquez, male    DOB: 1965-11-26, 52 y.o.   MRN: [id number]  DOS:  06/11/2017 Type of visit - description : acute Interval history: Symptoms started 2 days ago with a very mild ST that has been progressing, this morning he had pain at the throat with swallowing. He is concerned because his wife is undergoing chemotherapy. Other than that he feels well.  Review of Systems Denies fever chills No cough No headache or sneezing No nausea or vomiting No myalgias.  Past Medical History:  Diagnosis Date  . Allergy   . Asthma   . Gastric polyp 08/06/2010  . GERD (gastroesophageal reflux disease)   . Hyperlipidemia   . Hyperplastic colonic polyp 09/01/2009    Past Surgical History:  Procedure Laterality Date  . ACHILLES TENDON REPAIR Right 10/2011    Social History   Socioeconomic History  . Marital status: Married    Spouse name: Not on file  . Number of children: 0  . Years of education: Not on file  . Highest education level: Not on file  Social Needs  . Financial resource strain: Not on file  . Food insecurity - worry: Not on file  . Food insecurity - inability: Not on file  . Transportation needs - medical: Not on file  . Transportation needs - non-medical: Not on file  Occupational History  . 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 Smoking status: Never Smoker  . Smokeless tobacco: Never Used  Substance and Sexual Activity  . Alcohol use: Yes    Alcohol/week: 0.0 oz    Comment: rare  . Drug use: No  . Sexual activity: Yes    Partners: Female  Other Topics Concern  . Not on file  Social History Narrative   Exercise-- 3x a week      Allergies as of 06/11/2017   No Known Allergies     Medication List        Accurate as of 06/11/17 11:59 PM. Always use your most recent med list.          atorvastatin 80 MG tablet Commonly known as:  LIPITOR Take 1 tablet (80 mg total)  by mouth daily.   clotrimazole-betamethasone cream Commonly known as:  LOTRISONE Apply 1 application topically 2 (two) times daily.   diclofenac 75 MG EC tablet Commonly known as:  VOLTAREN Take 1 tablet by mouth as needed.   levocetirizine 5 MG tablet Commonly known as:  XYZAL TAKE 1 TABLET BY MOUTH IN THE EVENING   PROAIR HFA 108 (90 Base) MCG/ACT inhaler Generic drug:  albuterol INHALE 2 PUFFS INTO THE LUNGS EVERY 6 HOURS AS NEEDED          Objective:   Physical Exam BP 126/74 (BP Location: Left Arm, Patient Position: Sitting, Cuff Size: Normal)   Pulse 76   Temp 98.1 F (36.7 C) (Oral)   Resp 14   Ht 5\' 9"  (1.753 m)   Wt 224 lb (101.6 kg)   SpO2 97%   BMI 33.08 kg/m  General:   Well developed, well nourished . NAD.  HEENT:  Normocephalic . Face symmetric, atraumatic. TMs normal, throat symmetric and not red, nose not congested. Lungs:  CTA B Normal respiratory effort, no intercostal retractions, no accessory muscle use. Heart: RRR,  no murmur.  No pretibial edema bilaterally  Skin: Not pale. Not jaundice Neurologic:  alert & oriented X3.  Speech normal, gait appropriate for age and unassisted Psych--  Cognition and judgment appear intact.  Cooperative with normal attention span and concentration.  Behavior appropriate. No anxious or depressed appearing.      Assessment & Plan:   52 year old gentleman with history of asthma, high cholesterol,GERD, presents with:  URI: Mild sore throat likely due to a URI, patient is concerned about his wife who is undergoing chemotherapy, does not like to pass anything to her. For completeness a strep test was checked : neg Recommend conservative treatment, see AVS, precautions discussed.

## 2017-06-11 NOTE — Progress Notes (Signed)
Pre visit review using our clinic review tool, if applicable. No additional management support is needed unless otherwise documented below in the visit note. 

## 2017-06-17 ENCOUNTER — Other Ambulatory Visit: Payer: Self-pay | Admitting: Family Medicine

## 2017-09-26 ENCOUNTER — Encounter: Payer: Self-pay | Admitting: Family Medicine

## 2017-09-26 ENCOUNTER — Ambulatory Visit (INDEPENDENT_AMBULATORY_CARE_PROVIDER_SITE_OTHER): Payer: Federal, State, Local not specified - PPO | Admitting: Family Medicine

## 2017-09-26 VITALS — BP 100/72 | HR 57 | Temp 98.2°F | Resp 16 | Ht 69.0 in | Wt 215.4 lb

## 2017-09-26 DIAGNOSIS — Z125 Encounter for screening for malignant neoplasm of prostate: Secondary | ICD-10-CM | POA: Diagnosis not present

## 2017-09-26 DIAGNOSIS — Z0000 Encounter for general adult medical examination without abnormal findings: Secondary | ICD-10-CM

## 2017-09-26 DIAGNOSIS — E785 Hyperlipidemia, unspecified: Secondary | ICD-10-CM | POA: Diagnosis not present

## 2017-09-26 DIAGNOSIS — Z23 Encounter for immunization: Secondary | ICD-10-CM | POA: Diagnosis not present

## 2017-09-26 LAB — CBC WITH DIFFERENTIAL/PLATELET
BASOS ABS: 0 10*3/uL (ref 0.0–0.1)
BASOS PCT: 1.2 % (ref 0.0–3.0)
EOS PCT: 2.5 % (ref 0.0–5.0)
Eosinophils Absolute: 0.1 10*3/uL (ref 0.0–0.7)
HEMATOCRIT: 43.3 % (ref 39.0–52.0)
Hemoglobin: 14.5 g/dL (ref 13.0–17.0)
LYMPHS ABS: 1.1 10*3/uL (ref 0.7–4.0)
LYMPHS PCT: 29.7 % (ref 12.0–46.0)
MCHC: 33.5 g/dL (ref 30.0–36.0)
MCV: 89.7 fl (ref 78.0–100.0)
MONOS PCT: 10 % (ref 3.0–12.0)
Monocytes Absolute: 0.4 10*3/uL (ref 0.1–1.0)
NEUTROS ABS: 2.1 10*3/uL (ref 1.4–7.7)
NEUTROS PCT: 56.6 % (ref 43.0–77.0)
PLATELETS: 186 10*3/uL (ref 150.0–400.0)
RBC: 4.83 Mil/uL (ref 4.22–5.81)
RDW: 13 % (ref 11.5–15.5)
WBC: 3.7 10*3/uL — ABNORMAL LOW (ref 4.0–10.5)

## 2017-09-26 LAB — COMPREHENSIVE METABOLIC PANEL
ALBUMIN: 4.2 g/dL (ref 3.5–5.2)
ALK PHOS: 68 U/L (ref 39–117)
ALT: 33 U/L (ref 0–53)
AST: 26 U/L (ref 0–37)
BILIRUBIN TOTAL: 1.1 mg/dL (ref 0.2–1.2)
BUN: 10 mg/dL (ref 6–23)
CALCIUM: 9.5 mg/dL (ref 8.4–10.5)
CO2: 30 meq/L (ref 19–32)
CREATININE: 1.27 mg/dL (ref 0.40–1.50)
Chloride: 107 mEq/L (ref 96–112)
GFR: 76.81 mL/min (ref >60.00)
Glucose, Bld: 99 mg/dL (ref 70–99)
Potassium: 4.3 mEq/L (ref 3.5–5.1)
Sodium: 143 mEq/L (ref 135–145)
TOTAL PROTEIN: 6.7 g/dL (ref 6.0–8.3)

## 2017-09-26 LAB — LIPID PANEL
CHOLESTEROL: 111 mg/dL (ref 0–200)
HDL: 51.6 mg/dL (ref >39.00)
LDL Cholesterol: 48 mg/dL (ref 0–99)
NonHDL: 59.5
TRIGLYCERIDES: 56 mg/dL (ref 0.0–149.0)
Total CHOL/HDL Ratio: 2
VLDL: 11.2 mg/dL (ref 0.0–40.0)

## 2017-09-26 LAB — PSA: PSA: 2.35 ng/mL (ref 0.10–4.00)

## 2017-09-26 NOTE — Assessment & Plan Note (Signed)
Given today.

## 2017-09-26 NOTE — Progress Notes (Signed)
Patient ID: Juan Velazquez, male    DOB: 1966/04/20  Age: 52 y.o. MRN: [id number]    Subjective:  Subjective  HPI NELS MUNN presents for cpe.   His wife is now in hospice since April 1.  He is doing ok under the circumstances and is taking advantage of the counseling with hospice.  Pt is going [place] Nov/ dec on missions trip with his church.  He will need some vaccines.   Review of Systems  Constitutional: Negative for chills and fever.  HENT: Negative for congestion and hearing loss.   Eyes: Negative for discharge.  Respiratory: Negative for cough and shortness of breath.   Cardiovascular: Negative for chest pain, palpitations and leg swelling.  Gastrointestinal: Negative for abdominal pain, blood in stool, constipation, diarrhea, nausea and vomiting.  Genitourinary: Negative for dysuria, frequency, hematuria and urgency.  Musculoskeletal: Negative for back pain and myalgias.  Skin: Negative for rash.  Allergic/Immunologic: Negative for environmental allergies.  Neurological: Negative for dizziness, weakness and headaches.  Hematological: Does not bruise/bleed easily.  Psychiatric/Behavioral: Negative for suicidal ideas. The patient is not nervous/anxious.     History Past Medical History:  Diagnosis Date  . Allergy   . Asthma   . Gastric polyp 08/06/2010  . GERD (gastroesophageal reflux disease)   . Hyperlipidemia   . Hyperplastic colonic polyp 09/01/2009    He has a past surgical history that includes Achilles tendon repair (Right, 10/2011).   His family history includes Cancer in his other; Colon cancer in his maternal aunt; Diabetes in his brother, mother, and sister; Diabetes type II in his brother; Heart disease in his father; Hypertension in his brother and mother; Kidney disease in his mother.He reports that he has never smoked. He has never used smokeless tobacco. He reports that he drinks alcohol. He reports that he does not use drugs.  Current  Outpatient Medications on File Prior to Visit  Medication Sig Dispense Refill  . atorvastatin (LIPITOR) 80 MG tablet TAKE 1 TABLET BY MOUTH EVERY DAY 90 tablet 1  . clotrimazole-betamethasone (LOTRISONE) cream Apply 1 application topically 2 (two) times daily. 30 g 1  . diclofenac (VOLTAREN) 75 MG EC tablet Take 1 tablet by mouth as needed.  1  . levocetirizine (XYZAL) 5 MG tablet TAKE 1 TABLET BY MOUTH IN THE EVENING 30 tablet 6   Current Facility-Administered Medications on File Prior to Visit  Medication Dose Route Frequency Provider Last Rate Last Dose  . methylPREDNISolone acetate (DEPO-MEDROL) injection 40 mg  40 mg Intramuscular Once Carollee Herter, Kendrick Fries R, DO         Objective:  Objective  Physical Exam  Constitutional: He is oriented to person, place, and time. He appears well-developed and well-nourished. No distress.  HENT:  Head: Normocephalic and atraumatic.  Right Ear: External ear normal.  Left Ear: External ear normal.  Nose: Nose normal.  Mouth/Throat: Oropharynx is clear and moist. No oropharyngeal exudate.  Eyes: Pupils are equal, round, and reactive to light. Conjunctivae and EOM are normal. Right eye exhibits no discharge. Left eye exhibits no discharge.  Neck: Normal range of motion. Neck supple. No JVD present. No thyromegaly present.  Cardiovascular: Normal rate, regular rhythm and intact distal pulses.  No murmur heard. Pulmonary/Chest: Effort normal and breath sounds normal. No respiratory distress. He has no wheezes. He has no rales. He exhibits no tenderness.  Abdominal: Soft. Bowel sounds are normal. He exhibits no distension and no mass. There is no tenderness. There is no  rebound and no guarding.  Genitourinary: Prostate normal and penis normal. Rectal exam shows guaiac negative stool. No penile tenderness.  Musculoskeletal: Normal range of motion. He exhibits no edema or tenderness.  Lymphadenopathy:    He has no cervical adenopathy.  Neurological: He is  alert and oriented to person, place, and time. He has normal reflexes. He displays normal reflexes. No cranial nerve deficit. He exhibits normal muscle tone.  Skin: Skin is warm and dry. No rash noted. He is not diaphoretic. No erythema.  Psychiatric: He has a normal mood and affect. His behavior is normal. Judgment and thought content normal.  Nursing note and vitals reviewed.  BP 100/72 (BP Location: Right Arm, Cuff Size: Large)   Pulse (!) 57   Temp 98.2 F (36.8 C) (Oral)   Resp 16   Ht 5\' 9"  (1.753 m)   Wt 215 lb 6.4 oz (97.7 kg)   SpO2 97%   BMI 31.81 kg/m  Wt Readings from Last 3 Encounters:  09/26/17 215 lb 6.4 oz (97.7 kg)  06/11/17 224 lb (101.6 kg)  02/24/17 220 lb 6.4 oz (100 kg)     Lab Results  Component Value Date   WBC 3.6 (L) 09/23/2016   HGB 14.0 09/23/2016   HCT 42.6 09/23/2016   PLT 212.0 09/23/2016   GLUCOSE 92 01/03/2017   CHOL 202 (H) 12/26/2016   TRIG 76.0 12/26/2016   HDL 60.90 12/26/2016   LDLDIRECT 133.7 07/01/2012   LDLCALC 126 (H) 12/26/2016   ALT 23 12/26/2016   AST 27 12/26/2016   NA 142 01/03/2017   K 4.4 01/03/2017   CL 108 01/03/2017   CREATININE 1.18 01/03/2017   BUN 19 01/03/2017   CO2 29 01/03/2017   TSH 1.81 09/23/2016   PSA 2.56 09/23/2016   MICROALBUR <0.7 07/26/2014    Dg Knee Ap/lat W/sunrise Right  Result Date: 08/19/2016 CLINICAL DATA:  Pain for 2 years EXAM: RIGHT KNEE 3 VIEWS COMPARISON:  None. FINDINGS: Standing frontal, standing lateral, and sunrise patellar images obtained. No fracture or dislocation. No joint effusion. Joint spaces appear normal. No erosive change. IMPRESSION: No fracture or dislocation.  No apparent arthropathy. Electronically Signed   By: Lowella Grip III M.D.   On: 08/19/2016 15:41     Assessment & Plan:  Plan  I have discontinued Precious Gilchrest. Dykema's PROAIR HFA. I am also having him maintain his levocetirizine, diclofenac, clotrimazole-betamethasone, and atorvastatin. We will continue  to administer methylPREDNISolone acetate.  No orders of the defined types were placed in this encounter.   Problem List Items Addressed This Visit      Unprioritized   Hyperlipidemia LDL goal <100    Encouraged heart healthy diet, increase exercise, avoid trans fats, consider a krill oil cap daily      Relevant Orders   Comprehensive metabolic panel   Lipid panel   CBC with Differential/Platelet   PSA   Measles/Mumps/Rubella Immunity   Varicella zoster antibody, IgG   Need for hepatitis A and B vaccination    #3 given today      Relevant Orders   Hepatitis A hepatitis B combined vaccine IM (Completed)   Need for Tdap vaccination    Given today      Relevant Orders   Tdap vaccine greater than or equal to 7yo IM (Completed)   Preventative health care - Primary    ghm utd Check labs See AVS       Relevant Orders   Comprehensive metabolic panel  Lipid panel   CBC with Differential/Platelet   PSA   Measles/Mumps/Rubella Immunity   Varicella zoster antibody, IgG   PSA      Follow-up: Return in about 6 months (around 03/29/2018), or if symptoms worsen or fail to improve.  Ann Held, DO

## 2017-09-26 NOTE — Assessment & Plan Note (Signed)
#  3 given today  

## 2017-09-26 NOTE — Assessment & Plan Note (Signed)
ghm utd Check labs See AVS 

## 2017-09-26 NOTE — Patient Instructions (Signed)

## 2017-09-26 NOTE — Assessment & Plan Note (Signed)
Encouraged heart healthy diet, increase exercise, avoid trans fats, consider a krill oil cap daily 

## 2017-09-29 LAB — MEASLES/MUMPS/RUBELLA IMMUNITY
Rubella: 7.29 index
Rubeola IgG: 32.8 AU/mL

## 2017-09-29 LAB — VARICELLA ZOSTER ANTIBODY, IGG: Varicella IgG: 2672 index

## 2017-11-06 ENCOUNTER — Telehealth: Payer: Self-pay | Admitting: Family Medicine

## 2017-11-06 NOTE — Telephone Encounter (Signed)
That family members dr--- the transplant team --  Would do the testing to see if he is compatible

## 2017-11-06 NOTE — Telephone Encounter (Signed)
Copied from [place] [id number]. Topic: Quick Communication - See Telephone Encounter >> Nov 06, 2017  4:04 PM Rutherford Nail, NT wrote: CRM for notification. See Telephone encounter for: 11/06/17. Patient calling and is inquiring information about kidney donation. States that he has a family member in need of a kidney and would like some more information on the process of donations and what all is done. Please advise. CB#:[phone]

## 2017-11-07 NOTE — Telephone Encounter (Signed)
Left message on machine to call back  

## 2017-12-31 ENCOUNTER — Ambulatory Visit: Payer: Self-pay | Admitting: *Deleted

## 2017-12-31 ENCOUNTER — Encounter: Payer: Self-pay | Admitting: Internal Medicine

## 2017-12-31 ENCOUNTER — Ambulatory Visit: Payer: Federal, State, Local not specified - PPO | Admitting: Internal Medicine

## 2017-12-31 VITALS — BP 116/72 | HR 74 | Temp 98.5°F | Ht 69.0 in | Wt 222.0 lb

## 2017-12-31 DIAGNOSIS — R202 Paresthesia of skin: Secondary | ICD-10-CM | POA: Diagnosis not present

## 2017-12-31 MED ORDER — PREDNISONE 10 MG PO TABS: ORAL_TABLET | ORAL | 0 refills | Status: DC

## 2017-12-31 NOTE — Assessment & Plan Note (Signed)
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

## 2017-12-31 NOTE — Telephone Encounter (Signed)
Pt called with complaints of decreased sensation to left arm numbness and tingline predominately in his left hand and fingers that intermittently go to his forearm and bicep area; he says that this has been going on for about 3 weeks; he also says that he has not experienced a loss of strength, and he does not feel that this is a sports injury; the pt also says that he would like to be evaluated by a MD; nurse triage initiated and recommendations made per protocol to include seeing a physician within 4 hours; additionally he would like to discuss if he has sleep apnea due to comments made at a recent dentist appointment; the pt normally sees Dr Etter Sjogren, [facility], but she nor any provider have availability within guidelines per protocol; the pt is offered and accepted an appointment with Dr Cathlean Cower, [facility], today at 1620; he verbalizes understanding; will route to office for notification of this upcoming appointment Reason for Disposition . [1] Tingling (e.g., pins and needles) of the face, arm / hand, or leg / foot on one side of the body AND [2] present now  Answer Assessment - Initial Assessment Questions 1. SYMPTOM: "What is the main symptom you are concerned about?" (e.g., weakness, numbness)     Numbness and tingling 2. ONSET: "When did this start?" (minutes, hours, days; while sleeping)     3 weeks ago 3. LAST NORMAL: "When was the last time you were normal (no symptoms)?"     3 weeks ago 4. PATTERN "Does this come and go, or has it been constant since it started?"  "Is it present now?"     Constant,yes but intensity changes 5. CARDIAC SYMPTOMS: "Have you had any of the following symptoms: chest pain, difficulty breathing, palpitations?"     no 6. NEUROLOGIC SYMPTOMS: "Have you had any of the following symptoms: headache, dizziness, vision loss, double vision, changes in speech, unsteady on your feet?"     no 7. OTHER SYMPTOMS: "Do you have any other symptoms?"     no 8. PREGNANCY: "Is  there any chance you are pregnant?" "When was your last menstrual period?"     n/a  Protocols used: NEUROLOGIC DEFICIT-A-AH

## 2017-12-31 NOTE — Progress Notes (Signed)
Subjective:    Patient ID: Juan Velazquez, male    DOB: 1965-07-12, 52 y.o.   MRN: [id number]  HPI  Here to f/u with c/o paresthesia numb tingling like a foot that falls asleep, mild to mod, intermittent but more persistent lately, x 6 wks, maybe worse to bend the arm at the elbow, numbness seemed to start at the 4th and 5th fingers, then the wrist and arm, and recently felt in the left tricep area.  Denies pain overall or weakness.  No loss of hand strength.  Does lift wts on a regular basis and does quite a bit of upper body arm excercises  No recent trauma, fever or joint pain or swelling.  Does take MVI with b12 daily.  No HA or other focal neuro complaints.  No polys. Pt denies chest pain, increased sob or doe, wheezing, orthopnea, PND, increased LE swelling, palpitations, dizziness or syncope. Past Medical History:  Diagnosis Date  . Allergy   . Asthma   . Gastric polyp 08/06/2010  . GERD (gastroesophageal reflux disease)   . Hyperlipidemia   . Hyperplastic colonic polyp 09/01/2009   Past Surgical History:  Procedure Laterality Date  . ACHILLES TENDON REPAIR Right 10/2011    reports that he has never smoked. He has never used smokeless tobacco. He reports that he drinks alcohol. He reports that he does not use drugs. family history includes Cancer in his other; Colon cancer in his maternal aunt; Diabetes in his brother, mother, and sister; Diabetes type II in his brother; Heart disease in his father; Hypertension in his brother and mother; Kidney disease in his mother. No Known Allergies Current Outpatient Medications on File Prior to Visit  Medication Sig Dispense Refill  . atorvastatin (LIPITOR) 80 MG tablet TAKE 1 TABLET BY MOUTH EVERY DAY 90 tablet 1  . clotrimazole-betamethasone (LOTRISONE) cream Apply 1 application topically 2 (two) times daily. 30 g 1  . diclofenac (VOLTAREN) 75 MG EC tablet Take 1 tablet by mouth as needed.  1  . levocetirizine (XYZAL) 5 MG tablet TAKE  1 TABLET BY MOUTH IN THE EVENING 30 tablet 6   Current Facility-Administered Medications on File Prior to Visit  Medication Dose Route Frequency Provider Last Rate Last Dose  . methylPREDNISolone acetate (DEPO-MEDROL) injection 40 mg  40 mg Intramuscular Once Carollee Herter, Alferd Apa, DO       Review of Systems  Constitutional: Negative for other unusual diaphoresis or sweats HENT: Negative for ear discharge or swelling Eyes: Negative for other worsening visual disturbances Respiratory: Negative for stridor or other swelling  Gastrointestinal: Negative for worsening distension or other blood Genitourinary: Negative for retention or other urinary change Musculoskeletal: Negative for other MSK pain or swelling Skin: Negative for color change or other new lesions Neurological: Negative for worsening tremors and other numbness  Psychiatric/Behavioral: Negative for worsening agitation or other fatigue \\All  other system neg per pt    Objective:   Physical Exam BP 116/72   Pulse 74   Temp 98.5 F (36.9 C) (Oral)   Ht 5\' 9"  (1.753 m)   Wt 222 lb (100.7 kg)   SpO2 98%   BMI 32.78 kg/m  VS noted,  Constitutional: Pt appears in NAD HENT: Head: NCAT.  Right Ear: External ear normal.  Left Ear: External ear normal.  Eyes: . Pupils are equal, round, and reactive to light. Conjunctivae and EOM are normal Nose: without d/c or deformity Neck: Neck supple. Gross normal ROM Cardiovascular: Normal rate and  regular rhythm.   Pulmonary/Chest: Effort normal and breath sounds without rales or wheezing.  Abd:  Soft, NT, ND, + BS, no organomegaly Neurological: Pt is alert. At baseline orientation, motor grossly intact, sens intact to LT Skin: Skin is warm. No rashes, other new lesions, no LE edema Psychiatric: Pt behavior is normal without agitation  No other exam findings Lab Results  Component Value Date   WBC 3.7 (L) 09/26/2017   HGB 14.5 09/26/2017   HCT 43.3 09/26/2017   PLT 186.0  09/26/2017   GLUCOSE 99 09/26/2017   CHOL 111 09/26/2017   TRIG 56.0 09/26/2017   HDL 51.60 09/26/2017   LDLDIRECT 133.7 07/01/2012   LDLCALC 48 09/26/2017   ALT 33 09/26/2017   AST 26 09/26/2017   NA 143 09/26/2017   K 4.3 09/26/2017   CL 107 09/26/2017   CREATININE 1.27 09/26/2017   BUN 10 09/26/2017   CO2 30 09/26/2017   TSH 1.81 09/23/2016   PSA 2.35 09/26/2017   MICROALBUR <0.7 07/26/2014       Assessment & Plan:

## 2017-12-31 NOTE — Patient Instructions (Signed)
Please wear a left wrist splint nightly (at night only) for at least 5 nights, then stop if not improved  Please take all new medication as prescribed - the prednisone  You will be contacted regarding the referral for: Nerve test for the arms only  Please continue all other medications as before, and refills have been done if requested.  Please have the pharmacy call with any other refills you may need.  Please keep your appointments with your specialists as you may have planned

## 2018-01-02 ENCOUNTER — Encounter: Payer: Self-pay | Admitting: Neurology

## 2018-01-05 ENCOUNTER — Other Ambulatory Visit: Payer: Self-pay | Admitting: *Deleted

## 2018-01-05 DIAGNOSIS — R202 Paresthesia of skin: Secondary | ICD-10-CM

## 2018-01-16 ENCOUNTER — Telehealth: Payer: Self-pay | Admitting: *Deleted

## 2018-01-16 DIAGNOSIS — E785 Hyperlipidemia, unspecified: Secondary | ICD-10-CM

## 2018-01-16 DIAGNOSIS — R739 Hyperglycemia, unspecified: Secondary | ICD-10-CM

## 2018-01-16 NOTE — Telephone Encounter (Signed)
Copied from [place] [id number]. Topic: General - Other >> Jan 15, 2018  2:46 PM Marin Olp L wrote: Reason for CRM: Patient is requesting a call back from [facility] or cma to dicuss what he need sregarding his healthcare at this time. He recently was told he's pre-diabetic.

## 2018-01-21 NOTE — Telephone Encounter (Signed)
Patient was going through screening to see if he can donate a kidney to his brother and they stated that his A1C level was elevated, not by much.  He will get the lab work loaded into [facility] and send it to [place].  He also has not taken his cholesterol medication in about 2 months now.  He would like to recheck his A1C level and cholesterol?  If we redo A1C what diagnosis would you like to link it with?

## 2018-01-21 NOTE — Telephone Encounter (Signed)
Left message on machine to call back  

## 2018-01-22 ENCOUNTER — Ambulatory Visit: Payer: Federal, State, Local not specified - PPO | Admitting: Family Medicine

## 2018-01-22 ENCOUNTER — Ambulatory Visit (INDEPENDENT_AMBULATORY_CARE_PROVIDER_SITE_OTHER): Payer: Federal, State, Local not specified - PPO | Admitting: Neurology

## 2018-01-22 DIAGNOSIS — R202 Paresthesia of skin: Secondary | ICD-10-CM

## 2018-01-22 DIAGNOSIS — M5412 Radiculopathy, cervical region: Secondary | ICD-10-CM

## 2018-01-22 NOTE — Procedures (Signed)
[facility]  [place], [place]  [place], [place] [address] Tel: [phone] Fax:  [phone] Test Date:  01/22/2018  Patient: Juan Velazquez DOB: [birth year] Physician: Narda Amber, DO  Sex: Male Height: 5\' 9"  Ref Phys: Cathlean Cower, MD  ID#: [id number] Temp: 36C Technician:    Patient Complaints: This is a 52 year-old man referred for evaluation of left arm paresthesias.  NCV & EMG Findings: Extensive electrodiagnostic testing of the left upper extremity and additional studies of the right shows:  1. Bilateral median, ulnar, and mixed palmar sensory responses are within normal limits. 2. Bilateral median and ulnar motor responses are within normal limits. 3. Chronic motor axon loss changes are seen affecting bilateral biceps and deltoid muscles, without accompanied active denervation.  Impression: 1. 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 2. There is no evidence of carpal tunnel syndrome affecting the upper extremities.   ___________________________ Narda Amber, DO    Nerve Conduction Studies Anti Sensory Summary Table   Site NR Peak (ms) Norm Peak (ms) P-T Amp (V) Norm P-T Amp  Left Median Anti Sensory (2nd Digit)  36C  Wrist    3.2 <3.6 24.4 >15  Right Median Anti Sensory (2nd Digit)  36C  Wrist    3.4 <3.6 19.9 >15  Left Ulnar Anti Sensory (5th Digit)  36C  Wrist    3.1 <3.1 10.2 >10  Right Ulnar Anti Sensory (5th Digit)  36C  Wrist    3.0 <3.1 14.7 >10   Motor Summary Table   Site NR Onset (ms) Norm Onset (ms) O-P Amp (mV) Norm O-P Amp Site1 Site2 Delta-0 (ms) Dist (cm) Vel (m/s) Norm Vel (m/s)  Left Median Motor (Abd Poll Brev)  36C  Wrist    3.0 <4.0 9.0 >6 Elbow Wrist 5.4 32.0 59 >50  Elbow    8.4  8.6         Right Median Motor (Abd Poll Brev)  36C  Wrist    3.0 <4.0 8.8 >6 Elbow Wrist 5.6 35.0 63 >50  Elbow    8.6  8.7         Left Ulnar Motor (Abd Dig Minimi)  36C   Wrist    2.0 <3.1 10.9 >7 B Elbow Wrist 4.4 27.0 61 >50  B Elbow    6.4  10.2  A Elbow B Elbow 1.6 10.0 63 >50  A Elbow    8.0  9.9         Right Ulnar Motor (Abd Dig Minimi)  36C  Wrist    2.1 <3.1 9.1 >7 B Elbow Wrist 3.9 26.0 67 >50  B Elbow    6.0  8.3  A Elbow B Elbow 1.7 10.0 59 >50  A Elbow    7.7  8.2          Comparison Summary Table   Site NR Peak (ms) Norm Peak (ms) P-T Amp (V) Site1 Site2 Delta-P (ms) Norm Delta (ms)  Left Median/Ulnar Palm Comparison (Wrist - 8cm)  36C  Median Palm    1.8 <2.2 22.7 Median Palm Ulnar Palm 0.3   Ulnar Palm    1.5 <2.2 7.2      Right Median/Ulnar Palm Comparison (Wrist - 8cm)  36C  Median Palm    2.0 <2.2 34.6 Median Palm Ulnar Palm 0.0   Ulnar Palm    2.0 <2.2 10.6       EMG   Side Muscle Ins Act Fibs  Psw Fasc Number Recrt Dur Dur. Amp Amp. Poly Poly. Comment  Left 1stDorInt Nml Nml Nml Nml Nml Nml Nml Nml Nml Nml Nml Nml N/A  Left PronatorTeres Nml Nml Nml Nml Nml Nml Nml Nml Nml Nml Nml Nml N/A  Left Biceps Nml Nml Nml Nml 2- Rapid Some 1+ Some 1+ Some 1+ N/A  Left Triceps Nml Nml Nml Nml Nml Nml Nml Nml Nml Nml Nml Nml N/A  Left Deltoid Nml Nml Nml Nml 1- Rapid Some 1+ Some 1+ Some 1+ N/A  Right Deltoid Nml Nml Nml Nml 1- Rapid Some 1+ Some 1+ Some 1+ N/A  Right Biceps Nml Nml Nml Nml 2- Rapid Some 1+ Some 1+ Some 1+ N/A      Waveforms:

## 2018-01-22 NOTE — Telephone Encounter (Signed)
Hyperglycemia Ok to repeat cmp, hgba1c, lipid

## 2018-01-23 NOTE — Telephone Encounter (Signed)
Patient notified and he will be in for labs

## 2018-01-27 ENCOUNTER — Telehealth: Payer: Self-pay

## 2018-01-27 ENCOUNTER — Other Ambulatory Visit (INDEPENDENT_AMBULATORY_CARE_PROVIDER_SITE_OTHER): Payer: Federal, State, Local not specified - PPO

## 2018-01-27 DIAGNOSIS — R739 Hyperglycemia, unspecified: Secondary | ICD-10-CM

## 2018-01-27 DIAGNOSIS — E785 Hyperlipidemia, unspecified: Secondary | ICD-10-CM | POA: Diagnosis not present

## 2018-01-27 LAB — LIPID PANEL
CHOL/HDL RATIO: 3
CHOLESTEROL: 179 mg/dL (ref 0–200)
HDL: 61.1 mg/dL (ref >39.00)
LDL CALC: 100 mg/dL — AB (ref 0–99)
NonHDL: 117.68
TRIGLYCERIDES: 89 mg/dL (ref 0.0–149.0)
VLDL: 17.8 mg/dL (ref 0.0–40.0)

## 2018-01-27 LAB — COMPREHENSIVE METABOLIC PANEL
ALBUMIN: 4.3 g/dL (ref 3.5–5.2)
ALT: 28 U/L (ref 0–53)
AST: 25 U/L (ref 0–37)
Alkaline Phosphatase: 69 U/L (ref 39–117)
BUN: 17 mg/dL (ref 6–23)
CHLORIDE: 105 meq/L (ref 96–112)
CO2: 30 mEq/L (ref 19–32)
CREATININE: 1.31 mg/dL (ref 0.40–1.50)
Calcium: 9.6 mg/dL (ref 8.4–10.5)
GFR: 74.01 mL/min (ref >60.00)
GLUCOSE: 89 mg/dL (ref 70–99)
Potassium: 4.2 mEq/L (ref 3.5–5.1)
SODIUM: 139 meq/L (ref 135–145)
Total Bilirubin: 1.1 mg/dL (ref 0.2–1.2)
Total Protein: 7.1 g/dL (ref 6.0–8.3)

## 2018-01-27 LAB — HEMOGLOBIN A1C: HEMOGLOBIN A1C: 5.8 % (ref 4.6–6.5)

## 2018-01-27 NOTE — Telephone Encounter (Signed)
Called pt, LVM.   CRM created.  

## 2018-01-27 NOTE — Telephone Encounter (Signed)
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  There was no problem found at the elbows or the wrists. This can be looked into further by ordering and MRI of the cervical spine, to see why the nerves are being pinched.  I will ask my staff to contact you to ask if this would be ok with you.  You may also need a Neurosurgury referral.  Shirron to please inform pt, and ask if he is OK with checking the neck MRI

## 2018-02-02 ENCOUNTER — Ambulatory Visit: Payer: Federal, State, Local not specified - PPO | Admitting: Family Medicine

## 2018-03-02 ENCOUNTER — Ambulatory Visit: Payer: Federal, State, Local not specified - PPO | Admitting: Family Medicine

## 2018-03-02 ENCOUNTER — Encounter: Payer: Self-pay | Admitting: Family Medicine

## 2018-03-02 VITALS — BP 122/81 | HR 65 | Ht 69.0 in | Wt 211.0 lb

## 2018-03-02 DIAGNOSIS — G8929 Other chronic pain: Secondary | ICD-10-CM

## 2018-03-02 DIAGNOSIS — M25562 Pain in left knee: Secondary | ICD-10-CM

## 2018-03-02 MED ORDER — DICLOFENAC SODIUM 1 % TD GEL: 4.0000 g | Freq: Four times a day (QID) | TRANSDERMAL | 1 refills | Status: DC

## 2018-03-02 NOTE — Progress Notes (Signed)
PCP: Ann Held, DO  Subjective:   HPI: Patient is a 52 y.o. male here for left knee pain.  Patient states his pain has been worsening over the past 5 years.  At the moment his pain is 0/10 but does increase with activity, can be sharp.  Specifically he notes increased pain with running or lower body workouts.  He has not done a lower body workout for the past 2 months and hopes that his pain would improve but it continues to be bothersome.  He wears an Ace wrap and Velcro knee brace.  Uses ibuprofen as needed.  Occasionally uses ice.  He denies any swelling, erythema or bruising.  No locking or instability.  No skin changes, numbness.  Past Medical History:  Diagnosis Date  . Allergy   . Asthma   . Gastric polyp 08/06/2010  . GERD (gastroesophageal reflux disease)   . Hyperlipidemia   . Hyperplastic colonic polyp 09/01/2009    Current Outpatient Medications on File Prior to Visit  Medication Sig Dispense Refill  . atorvastatin (LIPITOR) 80 MG tablet TAKE 1 TABLET BY MOUTH EVERY DAY 90 tablet 1  . clotrimazole-betamethasone (LOTRISONE) cream Apply 1 application topically 2 (two) times daily. 30 g 1  . diclofenac (VOLTAREN) 75 MG EC tablet Take 1 tablet by mouth as needed.  1  . levocetirizine (XYZAL) 5 MG tablet TAKE 1 TABLET BY MOUTH IN THE EVENING 30 tablet 6  . predniSONE (DELTASONE) 10 MG tablet 2 tabs by mouth per day for 5 days 10 tablet 0   Current Facility-Administered Medications on File Prior to Visit  Medication Dose Route Frequency Provider Last Rate Last Dose  . methylPREDNISolone acetate (DEPO-MEDROL) injection 40 mg  40 mg Intramuscular Once Ann Held, DO        Past Surgical History:  Procedure Laterality Date  . ACHILLES TENDON REPAIR Right 10/2011    No Known Allergies  Social History   Socioeconomic History  . Marital status: Married    Spouse name: Not on file  . Number of children: 0  . Years of education: Not on file  . Highest  education level: Not on file  Occupational History  . Occupation: dept [employer] --w/s    Employer: [employer]  . Financial resource strain: Not on file  . Food insecurity:    Worry: Not on file    Inability: Not on file  . Transportation needs:    Medical: Not on file    Non-medical: Not on file  Tobacco Use  . Smoking status: Never Smoker  . Smokeless tobacco: Never Used  Substance and Sexual Activity  . Alcohol use: Yes    Alcohol/week: 0.0 standard drinks    Comment: rare  . Drug use: No  . Sexual activity: Yes    Partners: Female  Lifestyle  . Physical activity:    Days per week: Not on file    Minutes per session: Not on file  . Stress: Not on file  Relationships  . Social connections:    Talks on phone: Not on file    Gets together: Not on file    Attends religious service: Not on file    Active member of club or organization: Not on file    Attends meetings of clubs or organizations: Not on file    Relationship status: Not on file  . Intimate partner violence:    Fear of current or ex partner: Not on file  Emotionally abused: Not on file    Physically abused: Not on file    Forced sexual activity: Not on file  Other Topics Concern  . Not on file  Social History Narrative   Exercise-- 3x a week    Family History  Problem Relation Age of Onset  . Diabetes Mother   . Hypertension Mother   . Kidney disease Mother   . Diabetes type II Brother   . Hypertension Brother   . Diabetes Brother   . Diabetes Sister   . Heart disease Father        defibrillator  . Colon cancer Maternal Aunt   . Cancer Other     BP 122/81   Pulse 65   Ht 5\' 9"  (1.753 m)   Wt 211 lb (95.7 kg)   BMI 31.16 kg/m   Review of Systems: See HPI above.     Objective:  Physical Exam:  Gen: awake, alert, NAD, comfortable in exam room Pulm: breathing unlabored   Left knee: - Inspection: no gross deformity. No swelling/effusion, erythema or bruising.  Skin intact - Palpation: Very minimal tenderness over the medial joint line - ROM: full active ROM with flexion and extension in knee  - Strength: 5/5 strength - Neuro/vasc: NV intact - Special Tests: - LIGAMENTS: negative anterior and posterior drawer, negative Lachman's, no MCL or LCL laxity  -- MENISCUS: negative McMurray's, negative Thessaly  -- PF JOINT: nml patellar mobility bilaterally without apprehension  Right knee: - Inspection: no gross deformity. No swelling/effusion, erythema or bruising. Skin intact - Palpation: no TTP - ROM: full active ROM with flexion and extension in knee and hip - Strength: 5/5 strength - Neuro/vasc: NV intact  Hips: normal ROM without pain   Assessment & Plan:  1.  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  No structural instability on exam.  No evidence of meniscal pathology. - Continue knee wrap/brace as needed for comfort - Patient given home exercises for strengthening - Voltaren gel 4 times daily as needed if approved. - Activity as tolerated - Follow-up as needed

## 2018-03-02 NOTE — Patient Instructions (Signed)
Your pain is due to arthritis. These are the different medications you can take for this: Tylenol 500mg  1-2 tabs three times a day for pain. Capsaicin, aspercreme, or biofreeze topically up to four times a day may also help with pain. Some supplements that may help for arthritis: Boswellia extract, curcumin, pycnogenol Voltaren gel up to 4 times a day - regularly for 7-10 days then prior to working out or a lot of walking. Cortisone injections are an option. If cortisone injections do not help, there are different types of shots that may help but they take longer to take effect. It's important that you continue to stay active. Straight leg raises, knee extensions 3 sets of 10 once a day (add ankle weight if these become too easy). Consider physical therapy to strengthen muscles around the joint that hurts to take pressure off of the joint itself. Shoe inserts with good arch support may be helpful. Heat or ice 15 minutes at a time 3-4 times a day as needed to help with pain. Water aerobics and cycling with low resistance are the best two types of exercise for arthritis though any exercise is ok as long as it doesn't worsen the pain. Follow up with me in 6 weeks or as needed if you're doing well.

## 2018-03-03 ENCOUNTER — Encounter: Payer: Self-pay | Admitting: Family Medicine

## 2018-03-30 ENCOUNTER — Ambulatory Visit: Payer: Federal, State, Local not specified - PPO | Admitting: Family Medicine

## 2018-04-27 IMAGING — CR DG CHEST 2V
2 series · 2 of 2 positions shown · non-contrast
Comparison: PA and lateral chest x-ray August 13, 2010

CLINICAL DATA: Cough and slight chest tightness began yesterday.
Symptoms were improved with a breathing treatment. History of asthma

EXAM:
CHEST  2 VIEW

[w chest pa]
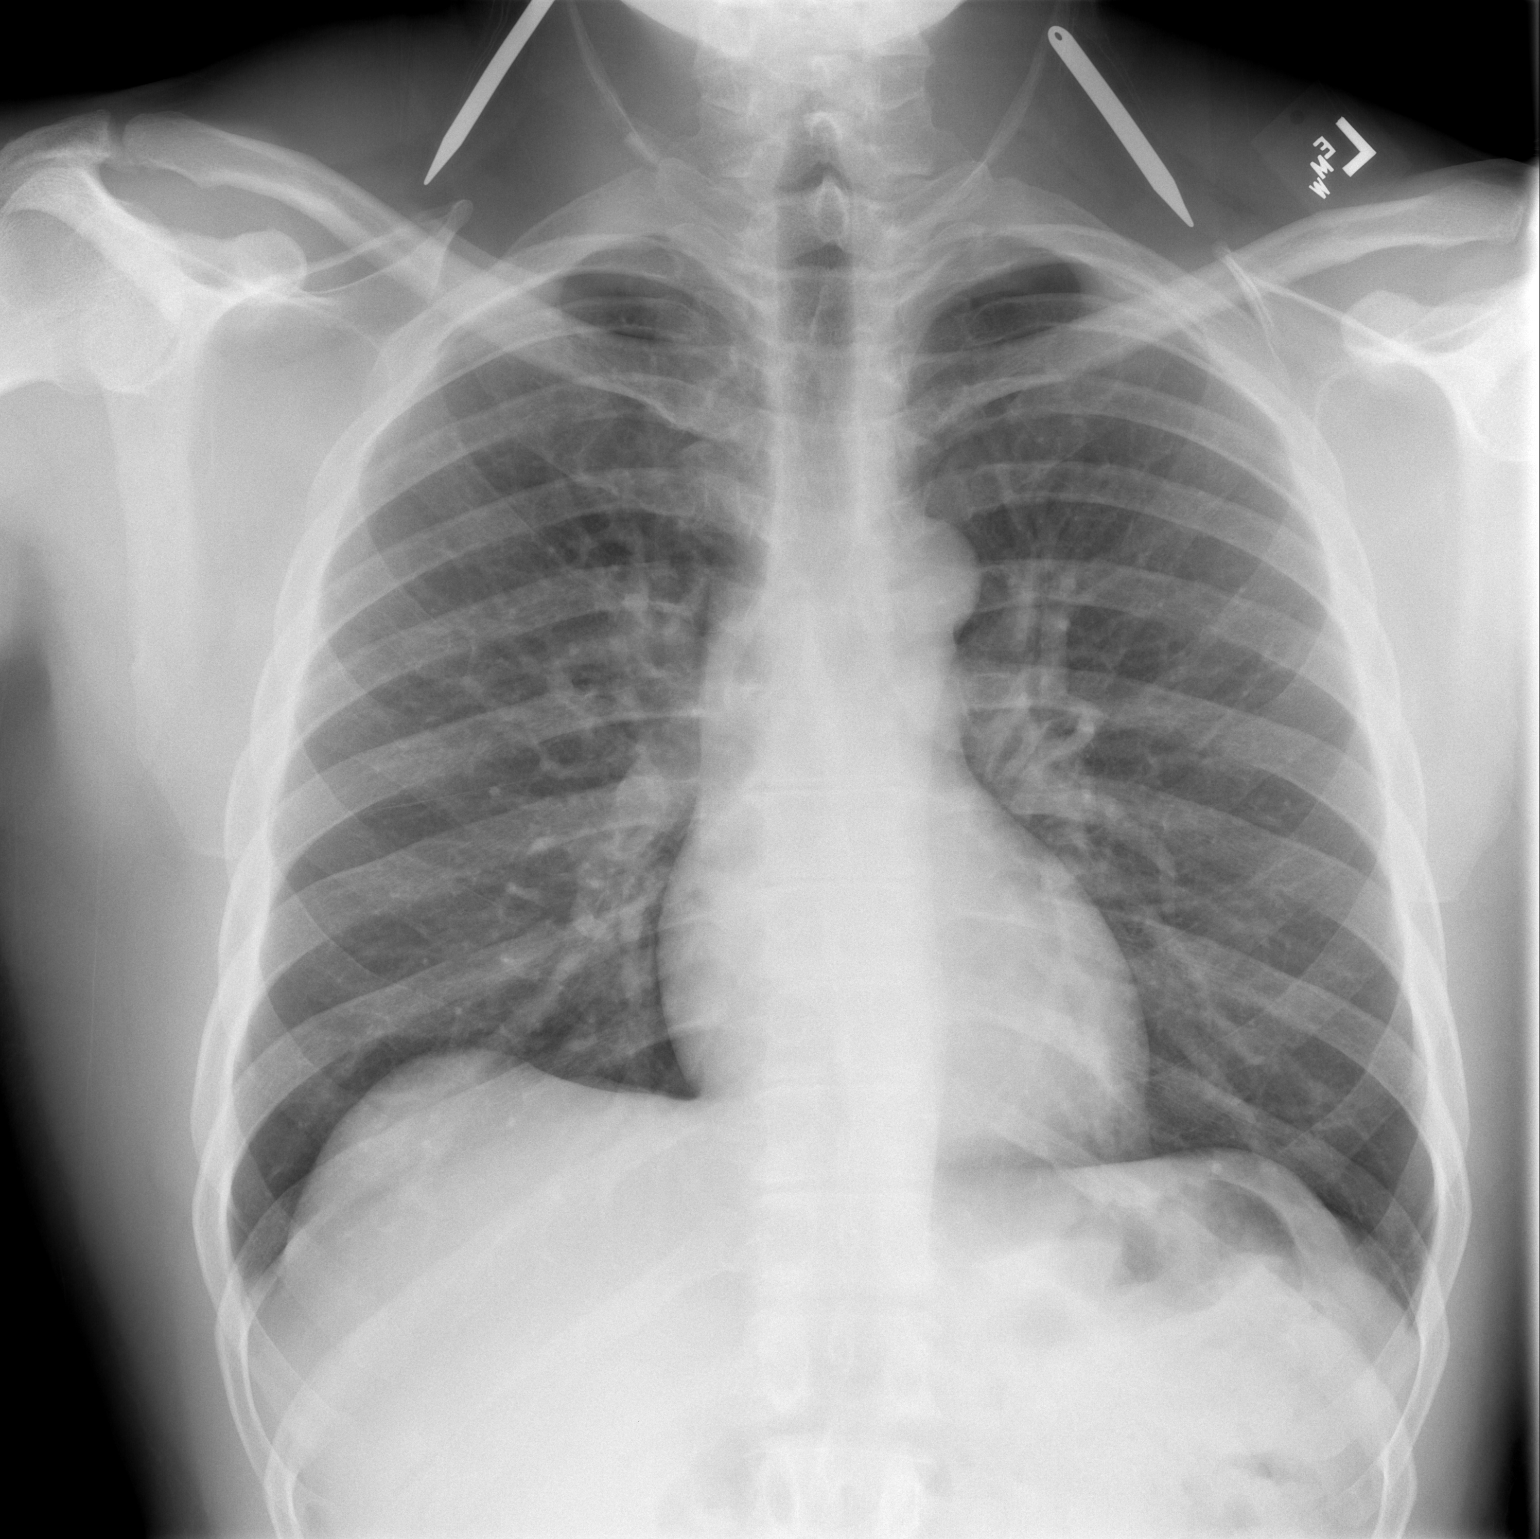

[w chest lat]
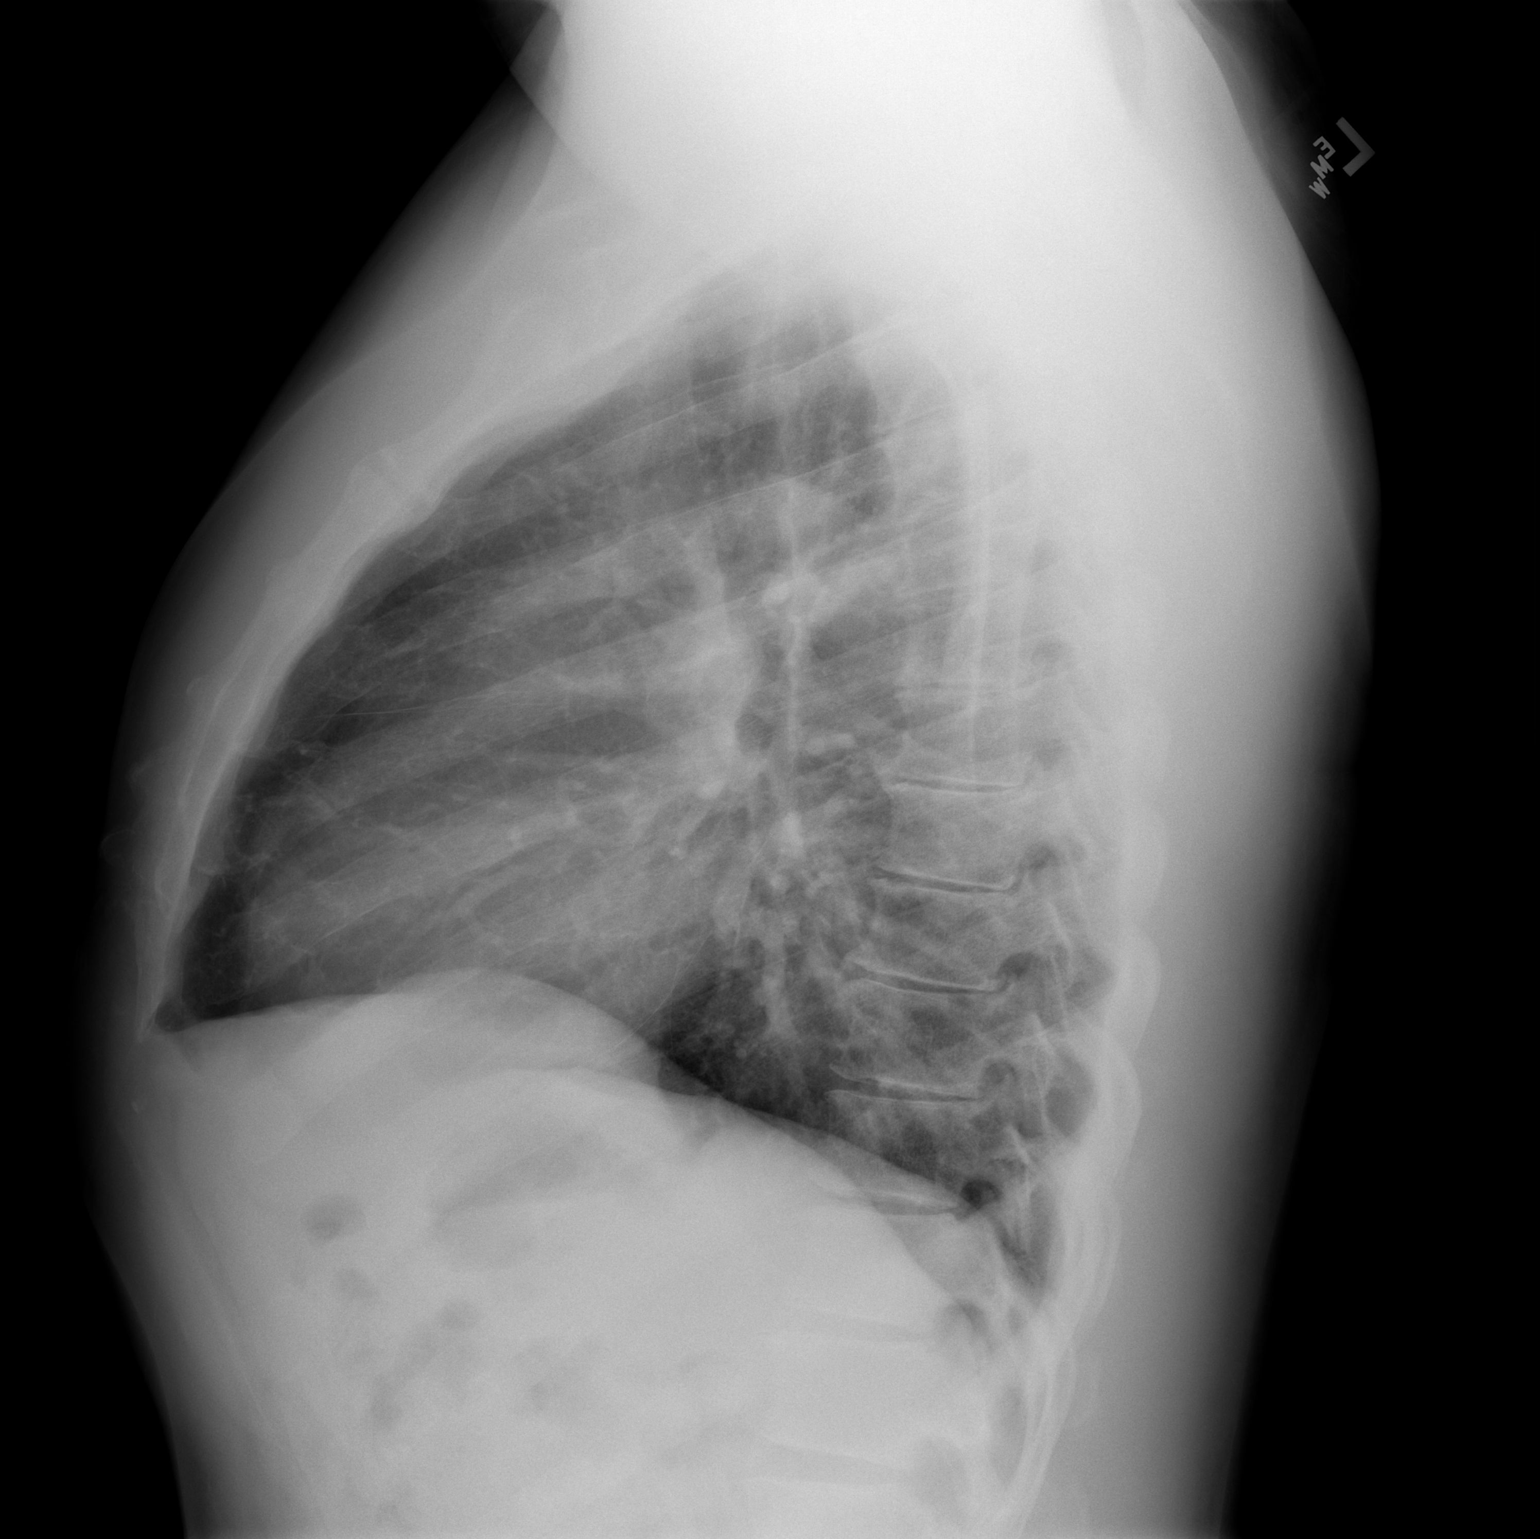

[2 of 2 positions shown; findings below may reference images not displayed]

FINDINGS: The lungs are adequately inflated. There is no focal infiltrate. The
perihilar lung markings are coarse but not greatly changed from the
previous study. The heart and pulmonary vascularity are normal. The
mediastinum is normal in width. There is no pleural effusion. There
is moderate multilevel degenerative disc space narrowing of the
thoracic spine which is stable.
IMPRESSION: There is no active cardiopulmonary disease.

## 2018-07-03 ENCOUNTER — Ambulatory Visit: Payer: Federal, State, Local not specified - PPO | Admitting: Family Medicine

## 2018-07-03 ENCOUNTER — Encounter: Payer: Self-pay | Admitting: Family Medicine

## 2018-07-03 VITALS — BP 112/66 | HR 68 | Temp 98.6°F | Resp 16 | Ht 69.0 in | Wt 216.8 lb

## 2018-07-03 DIAGNOSIS — E785 Hyperlipidemia, unspecified: Secondary | ICD-10-CM | POA: Diagnosis not present

## 2018-07-03 DIAGNOSIS — F320 Major depressive disorder, single episode, mild: Secondary | ICD-10-CM

## 2018-07-03 DIAGNOSIS — F418 Other specified anxiety disorders: Secondary | ICD-10-CM | POA: Insufficient documentation

## 2018-07-03 MED ORDER — SERTRALINE HCL 50 MG PO TABS: 50.0000 mg | ORAL_TABLET | Freq: Every day | ORAL | 3 refills | Status: DC

## 2018-07-03 NOTE — Assessment & Plan Note (Signed)
zoloft 50 mg ---- start with 1/2 tab daily for 8 days then increase to 1 po qd F/u 1 month or sooner prn con't with counseling through hospice and support group

## 2018-07-03 NOTE — Progress Notes (Signed)
Patient ID: Juan Velazquez, male    DOB: 1965-06-11  Age: 53 y.o. MRN: [id number]    Subjective:  Subjective  HPI Juan Velazquez presents for c/o depression / grief.  His wife died in 11/05/2022 and he has been struggling with fatigue/ sleep since.  He is working out still and at work--- friends have told him he may be depressed.  He just visited a friend in [place] and is headed to [place] to see his brother.  He is just sad his wife is not with him.  He is not suicidal   Review of Systems  Constitutional: Negative for appetite change, diaphoresis, fatigue and unexpected weight change.  Eyes: Negative for pain, redness and visual disturbance.  Respiratory: Negative for cough, chest tightness, shortness of breath and wheezing.   Cardiovascular: Negative for chest pain, palpitations and leg swelling.  Endocrine: Negative for cold intolerance, heat intolerance, polydipsia, polyphagia and polyuria.  Genitourinary: Negative for difficulty urinating, dysuria and frequency.  Neurological: Negative for dizziness, light-headedness, numbness and headaches.  Psychiatric/Behavioral: Positive for decreased concentration and sleep disturbance. Negative for self-injury and suicidal ideas. The patient is nervous/anxious.     History Past Medical History:  Diagnosis Date  . Allergy   . Asthma   . Gastric polyp 08/06/2010  . GERD (gastroesophageal reflux disease)   . Hyperlipidemia   . Hyperplastic colonic polyp 09/01/2009    He has a past surgical history that includes Achilles tendon repair (Right, 10/2011).   His family history includes Cancer in an other family member; Colon cancer in his maternal aunt; Diabetes in his brother, mother, and sister; Diabetes type II in his brother; Heart disease in his father; Hypertension in his brother and mother; Kidney disease in his mother.He reports that he has never smoked. He has never used smokeless tobacco. He reports current alcohol use. He reports  that he does not use drugs.  Current Outpatient Medications on File Prior to Visit  Medication Sig Dispense Refill  . atorvastatin (LIPITOR) 80 MG tablet TAKE 1 TABLET BY MOUTH EVERY DAY 90 tablet 1  . clotrimazole-betamethasone (LOTRISONE) cream Apply 1 application topically 2 (two) times daily. 30 g 1  . diclofenac (VOLTAREN) 75 MG EC tablet Take 1 tablet by mouth as needed.  1  . diclofenac sodium (VOLTAREN) 1 % GEL Apply 4 g topically 4 (four) times daily. 5 Tube 1  . levocetirizine (XYZAL) 5 MG tablet TAKE 1 TABLET BY MOUTH IN THE EVENING 30 tablet 6   Current Facility-Administered Medications on File Prior to Visit  Medication Dose Route Frequency Provider Last Rate Last Dose  . methylPREDNISolone acetate (DEPO-MEDROL) injection 40 mg  40 mg Intramuscular Once Carollee Herter, Kendrick Fries R, DO         Objective:  Objective  Physical Exam Vitals signs and nursing note reviewed.  Constitutional:      General: He is sleeping.     Appearance: He is well-developed.  HENT:     Head: Normocephalic and atraumatic.  Eyes:     Pupils: Pupils are equal, round, and reactive to light.  Neck:     Musculoskeletal: Normal range of motion and neck supple.     Thyroid: No thyromegaly.  Cardiovascular:     Rate and Rhythm: Normal rate and regular rhythm.     Heart sounds: No murmur.  Pulmonary:     Effort: Pulmonary effort is normal. No respiratory distress.     Breath sounds: Normal breath sounds. No wheezing or rales.  Chest:     Chest wall: No tenderness.  Musculoskeletal:        General: No tenderness.  Skin:    General: Skin is warm and dry.  Neurological:     Mental Status: He is oriented to person, place, and time.  Psychiatric:        Attention and Perception: Attention and perception normal.        Mood and Affect: Mood is depressed. Mood is not anxious. Affect is not flat or tearful.        Behavior: Behavior normal.        Thought Content: Thought content normal. Thought content  does not include suicidal ideation.        Cognition and Memory: Cognition and memory normal.        Judgment: Judgment normal.    BP 112/66 (BP Location: Right Arm, Cuff Size: Large)   Pulse 68   Temp 98.6 F (37 C) (Oral)   Resp 16   Ht 5\' 9"  (1.753 m)   Wt 216 lb 12.8 oz (98.3 kg)   SpO2 99%   BMI 32.02 kg/m  Wt Readings from Last 3 Encounters:  07/03/18 216 lb 12.8 oz (98.3 kg)  03/02/18 211 lb (95.7 kg)  12/31/17 222 lb (100.7 kg)     Lab Results  Component Value Date   WBC 3.7 (L) 09/26/2017   HGB 14.5 09/26/2017   HCT 43.3 09/26/2017   PLT 186.0 09/26/2017   GLUCOSE 89 01/27/2018   CHOL 179 01/27/2018   TRIG 89.0 01/27/2018   HDL 61.10 01/27/2018   LDLDIRECT 133.7 07/01/2012   LDLCALC 100 (H) 01/27/2018   ALT 28 01/27/2018   AST 25 01/27/2018   NA 139 01/27/2018   K 4.2 01/27/2018   CL 105 01/27/2018   CREATININE 1.31 01/27/2018   BUN 17 01/27/2018   CO2 30 01/27/2018   TSH 1.81 09/23/2016   PSA 2.35 09/26/2017   HGBA1C 5.8 01/27/2018   MICROALBUR <0.7 07/26/2014    Dg Knee Ap/lat W/sunrise Right  Result Date: 08/19/2016 CLINICAL DATA:  Pain for 2 years EXAM: RIGHT KNEE 3 VIEWS COMPARISON:  None. FINDINGS: Standing frontal, standing lateral, and sunrise patellar images obtained. No fracture or dislocation. No joint effusion. Joint spaces appear normal. No erosive change. IMPRESSION: No fracture or dislocation.  No apparent arthropathy. Electronically Signed   By: Lowella Grip III M.D.   On: 08/19/2016 15:41     Assessment & Plan:  Plan  I am having Juan Hartmann. Velazquez start on sertraline. I am also having him maintain his levocetirizine, diclofenac, clotrimazole-betamethasone, atorvastatin, and diclofenac sodium. We will continue to administer methylPREDNISolone acetate.  Meds ordered this encounter  Medications  . sertraline (ZOLOFT) 50 MG tablet    Sig: Take 1 tablet (50 mg total) by mouth daily.    Dispense:  30 tablet    Refill:  3     Problem List Items Addressed This Visit      Unprioritized   Depression, major, single episode, mild (HCC) - Primary    zoloft 50 mg ---- start with 1/2 tab daily for 8 days then increase to 1 po qd F/u 1 month or sooner prn con't with counseling through hospice and support group       Relevant Medications   sertraline (ZOLOFT) 50 MG tablet   Hyperlipidemia LDL goal <100    Tolerating statin, encouraged heart healthy diet, avoid trans fats, minimize simple carbs and saturated fats. Increase exercise  as tolerated       Other Visit Diagnoses    Hyperlipidemia, unspecified hyperlipidemia type       Relevant Orders   Lipid panel   Comprehensive metabolic panel      Follow-up: Return in about 4 weeks (around 07/31/2018) for depression, grief.  Ann Held, DO

## 2018-07-03 NOTE — Patient Instructions (Signed)
Coping With Loss, Adult  People experience loss in many different ways throughout their lives. Events such as moving, changing jobs, and losing friends can create a sense of loss. The loss may be as serious as a major health change, divorce, death of a pet, or death of a loved one. All of these types of loss are likely to create a physical and emotional reaction known as grief. Grief is the result of a major change or an absence of something or someone that you count on. Grief is a normal reaction to loss.  How to recognize changes  A variety of factors can affect your grieving experience, including:  · The nature of your loss.  · Your relationship to what or whom you lost.  · Your understanding of grief and how to cope with it.  · Your support system.  The way that you deal with your grief will affect your ability to function as you normally do. When you are grieving, you may experience:  · Numbness, shock, sadness, anxiety, anger, denial, and guilt.  · Thoughts about death.  · Unexpected crying.  · A physical sensation of emptiness in your gut.  · Problems sleeping and eating.  · Fatigue.  · Loss of interest in normal activities.  · Dreaming about or imagining seeing the person who died.  · A need to remember what or whom you lost.  · Difficulty thinking about anything other than your loss for a period of time.  · Relief. If you have been expecting the loss for a while, you may feel a sense of relief when it happens.  Where to find support  To get support for coping with loss:  · Ask your health care provider for help and recommendations, such as grief counseling or therapy.  · Think about joining a support group for people who are coping with loss.  Follow these instructions at home:    · Be patient with yourself and others. Allow the grieving process to happen, and remember that grieving takes time.  ? It is likely that you may never feel completely done with some grief. You may find a way to move on while  still cherishing memories and feelings about your loss.  ? Accepting your loss is a process. It can take months or longer to adjust.  · Express your feelings in healthy ways, such as:  ? Talking with others about your loss. It may be helpful to find others who have had a similar loss, such as a support group.  ? Writing down your feelings in a journal.  ? Doing physical activities to release stress and emotional energy.  ? Doing creative activities like painting, sculpting, or playing or listening to music.  ? Practicing resilience. This is the ability to recover and adjust after facing challenges. Reading some resources that encourage resilience may help you to learn ways to practice those behaviors.  · Keep to your normal routine as much as possible. If you have trouble focusing or doing normal activities, it is acceptable to take some time away from your normal routine.  · Spend time with friends and loved ones.  · Eat a healthy diet, get plenty of sleep, and rest when you feel tired.  Where to find more information  You can find more information about coping with loss from:  · American Society of Clinical Oncology: www.cancer.net  · American Psychological Association: www.apa.org  Contact a health care provider if:  · Your grief   is extreme and keeps getting worse.  · You have ongoing grief that does not improve.  · Your body shows symptoms of grief, such as illness.  · You feel depressed, anxious, or lonely.  Get help right away if:  · You have thoughts about hurting yourself or others.  If you ever feel like you may hurt yourself or others, or have thoughts about taking your own life, get help right away. You can go to your nearest emergency department or call:  · Your local emergency services (911 in the [place]  · A suicide crisis helpline, such as the National Suicide Prevention Lifeline at 1-800-273-8255. This is open 24 hours a day.  Summary  · Grief is a normal part of experiencing a loss. It is the result  of a major change or an absence of something or someone that you count on.  · The depth of grief and the period of recovery depend on the type of loss as well as your ability to adjust to the change and process your feelings.  · Processing grief requires patience and a willingness to accept your feelings and talk about your loss with people who are supportive.  · It is important to find resources that work for you and to realize that we are all different when it comes to grief. There is not one single grieving process that works for everyone in the same way.  · Be aware that when grief becomes extreme, it can lead to more severe issues like isolation, depression, anxiety, or suicidal thoughts. Talk with your health care provider if you have any of these issues.  This information is not intended to replace advice given to you by your health care provider. Make sure you discuss any questions you have with your health care provider.  Document Released: 09/19/2016 Document Revised: 09/19/2016 Document Reviewed: 09/19/2016  Elsevier Interactive Patient Education © 2019 [employer] Inc[employer]

## 2018-07-03 NOTE — Assessment & Plan Note (Signed)
Tolerating statin, encouraged heart healthy diet, avoid trans fats, minimize simple carbs and saturated fats. Increase exercise as tolerated 

## 2018-07-07 ENCOUNTER — Telehealth: Payer: Self-pay | Admitting: Family Medicine

## 2018-07-07 ENCOUNTER — Other Ambulatory Visit (INDEPENDENT_AMBULATORY_CARE_PROVIDER_SITE_OTHER): Payer: Federal, State, Local not specified - PPO

## 2018-07-07 DIAGNOSIS — E785 Hyperlipidemia, unspecified: Secondary | ICD-10-CM | POA: Diagnosis not present

## 2018-07-07 LAB — LIPID PANEL
Cholesterol: 202 mg/dL — ABNORMAL HIGH (ref 0–200)
HDL: 55.6 mg/dL (ref >39.00)
LDL Cholesterol: 127 mg/dL — ABNORMAL HIGH (ref 0–99)
NonHDL: 146.8
Total CHOL/HDL Ratio: 4
Triglycerides: 97 mg/dL (ref 0.0–149.0)
VLDL: 19.4 mg/dL (ref 0.0–40.0)

## 2018-07-07 LAB — COMPREHENSIVE METABOLIC PANEL
ALT: 21 U/L (ref 0–53)
AST: 20 U/L (ref 0–37)
Albumin: 4.2 g/dL (ref 3.5–5.2)
Alkaline Phosphatase: 74 U/L (ref 39–117)
BUN: 12 mg/dL (ref 6–23)
CO2: 30 mEq/L (ref 19–32)
Calcium: 9.2 mg/dL (ref 8.4–10.5)
Chloride: 106 mEq/L (ref 96–112)
Creatinine, Ser: 1.28 mg/dL (ref 0.40–1.50)
GFR: 71.4 mL/min (ref >60.00)
Glucose, Bld: 85 mg/dL (ref 70–99)
Potassium: 4.1 mEq/L (ref 3.5–5.1)
SODIUM: 142 meq/L (ref 135–145)
Total Bilirubin: 0.6 mg/dL (ref 0.2–1.2)
Total Protein: 6.9 g/dL (ref 6.0–8.3)

## 2018-07-07 NOTE — Telephone Encounter (Signed)
Pt dropped off sample of letter for provider to see (pt stated informed provider during his last visit to the office that he is needing a letter and brought in a sample of the letter that he needs) Please call pt when letter ready. Document put at front office tray under providers name.

## 2018-07-08 NOTE — Telephone Encounter (Signed)
Forwarded to provider/SLS 02/19

## 2018-07-09 ENCOUNTER — Encounter: Payer: Self-pay | Admitting: Internal Medicine

## 2018-07-09 ENCOUNTER — Ambulatory Visit: Payer: Federal, State, Local not specified - PPO | Admitting: Internal Medicine

## 2018-07-09 ENCOUNTER — Encounter: Payer: Self-pay | Admitting: *Deleted

## 2018-07-09 VITALS — BP 126/72 | HR 75 | Temp 97.8°F | Resp 16 | Ht 69.0 in | Wt 220.2 lb

## 2018-07-09 DIAGNOSIS — H00014 Hordeolum externum left upper eyelid: Secondary | ICD-10-CM | POA: Diagnosis not present

## 2018-07-09 MED ORDER — GENTAMICIN SULFATE 0.3 % OP SOLN: 2.0000 [drp] | OPHTHALMIC | 0 refills | Status: DC

## 2018-07-09 NOTE — Patient Instructions (Signed)
Place the antibiotic drops first  Warm compress  Do some gentle massage    Do not use contact lenses until you are completely well.  Call or see your eye doctor if not better in the next few days.

## 2018-07-09 NOTE — Progress Notes (Signed)
Pre visit review using our clinic review tool, if applicable. No additional management support is needed unless otherwise documented below in the visit note. 

## 2018-07-09 NOTE — Progress Notes (Signed)
Subjective:    Patient ID: Juan Velazquez, male    DOB: 04/20/1966, 53 y.o.   MRN: [id number]  DOS:  07/09/2018 Type of visit - description: Acute Symptoms started 3 days ago with mild irritation on the left upper eyelid. The next day, the area was puffy and it gradually got worse. He stopped using contact lenses already.   Review of Systems  Denies any conjunctival redness No actual discharge Past Medical History:  Diagnosis Date  . Allergy   . Asthma   . Gastric polyp 08/06/2010  . GERD (gastroesophageal reflux disease)   . Hyperlipidemia   . Hyperplastic colonic polyp 09/01/2009    Past Surgical History:  Procedure Laterality Date  . ACHILLES TENDON REPAIR Right 10/2011    Social History   Socioeconomic History  . Marital status: Married    Spouse name: Not on file  . Number of children: 0  . Years of education: Not on file  . Highest education level: Not on file  Occupational History  . Occupation: dept [employer] --w/s    Employer: [employer]  . Financial resource strain: Not on file  . Food insecurity:    Worry: Not on file    Inability: Not on file  . Transportation needs:    Medical: Not on file    Non-medical: Not on file  Tobacco Use  . Smoking status: Never Smoker  . Smokeless tobacco: Never Used  Substance and Sexual Activity  . Alcohol use: Yes    Alcohol/week: 0.0 standard drinks    Comment: rare  . Drug use: No  . Sexual activity: Yes    Partners: Female  Lifestyle  . Physical activity:    Days per week: Not on file    Minutes per session: Not on file  . Stress: Not on file  Relationships  . Social connections:    Talks on phone: Not on file    Gets together: Not on file    Attends religious service: Not on file    Active member of club or organization: Not on file    Attends meetings of clubs or organizations: Not on file    Relationship status: Not on file  . Intimate partner violence:    Fear of  current or ex partner: Not on file    Emotionally abused: Not on file    Physically abused: Not on file    Forced sexual activity: Not on file  Other Topics Concern  . Not on file  Social History Narrative   Exercise-- 3x a week      Allergies as of 07/09/2018   No Known Allergies     Medication List       Accurate as of July 09, 2018  1:58 PM. Always use your most recent med list.        atorvastatin 80 MG tablet Commonly known as:  LIPITOR TAKE 1 TABLET BY MOUTH EVERY DAY   clotrimazole-betamethasone cream Commonly known as:  LOTRISONE Apply 1 application topically 2 (two) times daily.   diclofenac 75 MG EC tablet Commonly known as:  VOLTAREN Take 1 tablet by mouth as needed.   diclofenac sodium 1 % Gel Commonly known as:  VOLTAREN Apply 4 g topically 4 (four) times daily.   levocetirizine 5 MG tablet Commonly known as:  XYZAL TAKE 1 TABLET BY MOUTH IN THE EVENING   sertraline 50 MG tablet Commonly known as:  ZOLOFT Take 1 tablet (50 mg total)  by mouth daily.           Objective:   Physical Exam Eyes:     BP 126/72 (BP Location: Left Arm, Patient Position: Sitting, Cuff Size: Normal)   Pulse 75   Temp 97.8 F (36.6 C) (Oral)   Resp 16   Ht 5\' 9"  (1.753 m)   Wt 220 lb 4 oz (99.9 kg)   SpO2 98%   BMI 32.53 kg/m  General:   Well developed, NAD, BMI noted. HEENT:  Normocephalic . Face symmetric, atraumatic EOMI, pupils equal and reactive.  Conjunctiva  not injected Skin: Not pale. Not jaundice Neurologic:  alert & oriented X3.  Speech normal, gait appropriate for age and unassisted Psych--  Cognition and judgment appear intact.  Cooperative with normal attention span and concentration.  Behavior appropriate. No anxious or depressed appearing.      Assessment     53 year old gentleman with history of asthma, high cholesterol,GERD, presents with:  Stye: Symptoms consistent with a stye, explained the patient what that is, recommend  warm compress, topical antibiotics, massages, call if not better, avoid contacts until he is completely well.  He verbalized understanding

## 2018-07-13 ENCOUNTER — Other Ambulatory Visit: Payer: Self-pay | Admitting: *Deleted

## 2018-07-13 DIAGNOSIS — E785 Hyperlipidemia, unspecified: Secondary | ICD-10-CM

## 2018-07-27 ENCOUNTER — Encounter: Payer: Self-pay | Admitting: Internal Medicine

## 2018-07-27 ENCOUNTER — Ambulatory Visit: Payer: Federal, State, Local not specified - PPO | Admitting: Internal Medicine

## 2018-07-27 VITALS — BP 126/62 | HR 69 | Temp 97.9°F | Resp 16 | Ht 69.0 in | Wt 216.0 lb

## 2018-07-27 DIAGNOSIS — H00014 Hordeolum externum left upper eyelid: Secondary | ICD-10-CM

## 2018-07-27 DIAGNOSIS — L089 Local infection of the skin and subcutaneous tissue, unspecified: Secondary | ICD-10-CM | POA: Diagnosis not present

## 2018-07-27 MED ORDER — DOXYCYCLINE HYCLATE 100 MG PO TABS: 100.0000 mg | ORAL_TABLET | Freq: Two times a day (BID) | ORAL | 0 refills | Status: DC

## 2018-07-27 NOTE — Progress Notes (Signed)
Pre visit review using our clinic review tool, if applicable. No additional management support is needed unless otherwise documented below in the visit note. 

## 2018-07-27 NOTE — Patient Instructions (Signed)
Take antibiotics as prescribed  We are referring you to the eye doctor  For the infection on the right side of your face: Put a warm compress, take the antibiotic by mouth, if you are not promptly getting better (1 or 2 days) please let us know. If you get much worse call us immediately

## 2018-07-27 NOTE — Progress Notes (Signed)
Subjective:    Patient ID: Juan Velazquez, male    DOB: 09-29-1965, 53 y.o.   MRN: [id number]  DOS:  07/27/2018 Type of visit - description: acute Was seen 07/09/2018 with hordeolum and treated with topical gentamicin, the areas is not much better. Also, 2 days ago developed a "pimple" at the right face, noted some swelling under the right eye.  The right eye per se is not affected.  Review of Systems No fever chills  Past Medical History:  Diagnosis Date  . Allergy   . Asthma   . Gastric polyp 08/06/2010  . GERD (gastroesophageal reflux disease)   . Hyperlipidemia   . Hyperplastic colonic polyp 09/01/2009    Past Surgical History:  Procedure Laterality Date  . ACHILLES TENDON REPAIR Right 10/2011    Social History   Socioeconomic History  . Marital status: Married    Spouse name: Not on file  . Number of children: 0  . Years of education: Not on file  . Highest education level: Not on file  Occupational History  . Occupation: dept [employer] --w/s    Employer: [employer]  . Financial resource strain: Not on file  . Food insecurity:    Worry: Not on file    Inability: Not on file  . Transportation needs:    Medical: Not on file    Non-medical: Not on file  Tobacco Use  . Smoking status: Never Smoker  . Smokeless tobacco: Never Used  Substance and Sexual Activity  . Alcohol use: Yes    Alcohol/week: 0.0 standard drinks    Comment: rare  . Drug use: No  . Sexual activity: Yes    Partners: Female  Lifestyle  . Physical activity:    Days per week: Not on file    Minutes per session: Not on file  . Stress: Not on file  Relationships  . Social connections:    Talks on phone: Not on file    Gets together: Not on file    Attends religious service: Not on file    Active member of club or organization: Not on file    Attends meetings of clubs or organizations: Not on file    Relationship status: Not on file  . Intimate partner  violence:    Fear of current or ex partner: Not on file    Emotionally abused: Not on file    Physically abused: Not on file    Forced sexual activity: Not on file  Other Topics Concern  . Not on file  Social History Narrative   Exercise-- 3x a week      Allergies as of 07/27/2018   No Known Allergies     Medication List       Accurate as of July 27, 2018 11:59 PM. Always use your most recent med list.        atorvastatin 80 MG tablet Commonly known as:  LIPITOR TAKE 1 TABLET BY MOUTH EVERY DAY   clotrimazole-betamethasone cream Commonly known as:  Lotrisone Apply 1 application topically 2 (two) times daily.   diclofenac 75 MG EC tablet Commonly known as:  VOLTAREN Take 1 tablet by mouth as needed.   diclofenac sodium 1 % Gel Commonly known as:  VOLTAREN Apply 4 g topically 4 (four) times daily.   doxycycline 100 MG tablet Commonly known as:  VIBRA-TABS Take 1 tablet (100 mg total) by mouth 2 (two) times daily.   levocetirizine 5 MG tablet Commonly  known as:  XYZAL TAKE 1 TABLET BY MOUTH IN THE EVENING   sertraline 50 MG tablet Commonly known as:  ZOLOFT Take 1 tablet (50 mg total) by mouth daily.           Objective:   Physical Exam HENT:     Head:   Eyes:     BP 126/62 (BP Location: Left Arm, Patient Position: Sitting, Cuff Size: Normal)   Pulse 69   Temp 97.9 F (36.6 C) (Oral)   Resp 16   Ht 5\' 9"  (1.753 m)   Wt 216 lb (98 kg)   SpO2 97%   BMI 31.90 kg/m  General:   Well developed, NAD, BMI noted. HEENT:  Normocephalic . Face symmetric, atraumatic Skin: Not pale. Not jaundice Neurologic:  alert & oriented X3.  Speech normal, gait appropriate for age and unassisted Psych--  Cognition and judgment appear intact.  Cooperative with normal attention span and concentration.  Behavior appropriate. No anxious or depressed appearing.      Assessment       53 year old gentleman with history of asthma, high cholesterol,GERD, presents  with:  Stye: Seen almost 3 weeks ago and diagnosed with a stye, did not respond to topical gentamicin.  The area continue to be swollen, ?Chalazium. Plan: Refer to ophthalmology.  Skin infection: He has what seems to be an infection by the side of the nose, see graphic.  Will treat with doxycycline, he is aware that this area is very delicate and he is to let me know if he is not feeling well promptly.  See AVS.

## 2018-07-31 ENCOUNTER — Encounter: Payer: Self-pay | Admitting: Family Medicine

## 2018-07-31 ENCOUNTER — Ambulatory Visit: Payer: Federal, State, Local not specified - PPO | Admitting: Family Medicine

## 2018-07-31 ENCOUNTER — Other Ambulatory Visit: Payer: Self-pay

## 2018-07-31 VITALS — BP 122/80 | HR 81 | Ht 69.0 in | Wt 218.0 lb

## 2018-07-31 DIAGNOSIS — F320 Major depressive disorder, single episode, mild: Secondary | ICD-10-CM

## 2018-07-31 DIAGNOSIS — G4700 Insomnia, unspecified: Secondary | ICD-10-CM

## 2018-07-31 NOTE — Patient Instructions (Signed)
Coping With Loss, Adult  People experience loss in many different ways throughout their lives. Events such as moving, changing jobs, and losing friends can create a sense of loss. The loss may be as serious as a major health change, divorce, death of a pet, or death of a loved one. All of these types of loss are likely to create a physical and emotional reaction known as grief. Grief is the result of a major change or an absence of something or someone that you count on. Grief is a normal reaction to loss.  How to recognize changes  A variety of factors can affect your grieving experience, including:  · The nature of your loss.  · Your relationship to what or whom you lost.  · Your understanding of grief and how to cope with it.  · Your support system.  The way that you deal with your grief will affect your ability to function as you normally do. When you are grieving, you may experience:  · Numbness, shock, sadness, anxiety, anger, denial, and guilt.  · Thoughts about death.  · Unexpected crying.  · A physical sensation of emptiness in your gut.  · Problems sleeping and eating.  · Fatigue.  · Loss of interest in normal activities.  · Dreaming about or imagining seeing the person who died.  · A need to remember what or whom you lost.  · Difficulty thinking about anything other than your loss for a period of time.  · Relief. If you have been expecting the loss for a while, you may feel a sense of relief when it happens.  Where to find support  To get support for coping with loss:  · Ask your health care provider for help and recommendations, such as grief counseling or therapy.  · Think about joining a support group for people who are coping with loss.  Follow these instructions at home:    · Be patient with yourself and others. Allow the grieving process to happen, and remember that grieving takes time.  ? It is likely that you may never feel completely done with some grief. You may find a way to move on while  still cherishing memories and feelings about your loss.  ? Accepting your loss is a process. It can take months or longer to adjust.  · Express your feelings in healthy ways, such as:  ? Talking with others about your loss. It may be helpful to find others who have had a similar loss, such as a support group.  ? Writing down your feelings in a journal.  ? Doing physical activities to release stress and emotional energy.  ? Doing creative activities like painting, sculpting, or playing or listening to music.  ? Practicing resilience. This is the ability to recover and adjust after facing challenges. Reading some resources that encourage resilience may help you to learn ways to practice those behaviors.  · Keep to your normal routine as much as possible. If you have trouble focusing or doing normal activities, it is acceptable to take some time away from your normal routine.  · Spend time with friends and loved ones.  · Eat a healthy diet, get plenty of sleep, and rest when you feel tired.  Where to find more information  You can find more information about coping with loss from:  · American Society of Clinical Oncology: www.cancer.net  · American Psychological Association: www.apa.org  Contact a health care provider if:  · Your grief   is extreme and keeps getting worse.  · You have ongoing grief that does not improve.  · Your body shows symptoms of grief, such as illness.  · You feel depressed, anxious, or lonely.  Get help right away if:  · You have thoughts about hurting yourself or others.  If you ever feel like you may hurt yourself or others, or have thoughts about taking your own life, get help right away. You can go to your nearest emergency department or call:  · Your local emergency services (911 in the [place]  · A suicide crisis helpline, such as the National Suicide Prevention Lifeline at 1-800-273-8255. This is open 24 hours a day.  Summary  · Grief is a normal part of experiencing a loss. It is the result  of a major change or an absence of something or someone that you count on.  · The depth of grief and the period of recovery depend on the type of loss as well as your ability to adjust to the change and process your feelings.  · Processing grief requires patience and a willingness to accept your feelings and talk about your loss with people who are supportive.  · It is important to find resources that work for you and to realize that we are all different when it comes to grief. There is not one single grieving process that works for everyone in the same way.  · Be aware that when grief becomes extreme, it can lead to more severe issues like isolation, depression, anxiety, or suicidal thoughts. Talk with your health care provider if you have any of these issues.  This information is not intended to replace advice given to you by your health care provider. Make sure you discuss any questions you have with your health care provider.  Document Released: 09/19/2016 Document Revised: 09/19/2016 Document Reviewed: 09/19/2016  Elsevier Interactive Patient Education © 2019 [employer] Inc[employer]

## 2018-07-31 NOTE — Progress Notes (Signed)
Patient ID: Juan Velazquez, male    DOB: November 23, 1965  Age: 53 y.o. MRN: [id number]    Subjective:  Subjective  HPI Juan Velazquez presents for f/u depression from loss of his wife.  He did not like the way the zoloft made him feels so he stopped it.  He is not sleeping well.he is falling a sleep but wakes up about 3-4 in am.  He states that he knows he snores and he had a sleep study years ago at the [place] but was told he did not have it.  Prior to his wife's death she told him he snores and does stop breathing.     Review of Systems  Constitutional: Negative for appetite change, diaphoresis, fatigue and unexpected weight change.  Eyes: Negative for pain, redness and visual disturbance.  Respiratory: Negative for cough, chest tightness, shortness of breath and wheezing.   Cardiovascular: Negative for chest pain, palpitations and leg swelling.  Endocrine: Negative for cold intolerance, heat intolerance, polydipsia, polyphagia and polyuria.  Genitourinary: Negative for difficulty urinating, dysuria and frequency.  Neurological: Negative for dizziness, light-headedness, numbness and headaches.    History Past Medical History:  Diagnosis Date  . Allergy   . Asthma   . Gastric polyp 08/06/2010  . GERD (gastroesophageal reflux disease)   . Hyperlipidemia   . Hyperplastic colonic polyp 09/01/2009    He has a past surgical history that includes Achilles tendon repair (Right, 10/2011).   His family history includes Cancer in an other family member; Colon cancer in his maternal aunt; Diabetes in his brother, mother, and sister; Diabetes type II in his brother; Heart disease in his father; Hypertension in his brother and mother; Kidney disease in his mother.He reports that he has never smoked. He has never used smokeless tobacco. He reports current alcohol use. He reports that he does not use drugs.  Current Outpatient Medications on File Prior to Visit  Medication Sig Dispense Refill   . atorvastatin (LIPITOR) 80 MG tablet TAKE 1 TABLET BY MOUTH EVERY DAY 90 tablet 1  . clotrimazole-betamethasone (LOTRISONE) cream Apply 1 application topically 2 (two) times daily. 30 g 1  . diclofenac (VOLTAREN) 75 MG EC tablet Take 1 tablet by mouth as needed.  1  . diclofenac sodium (VOLTAREN) 1 % GEL Apply 4 g topically 4 (four) times daily. 5 Tube 1  . doxycycline (VIBRA-TABS) 100 MG tablet Take 1 tablet (100 mg total) by mouth 2 (two) times daily. 15 tablet 0  . levocetirizine (XYZAL) 5 MG tablet TAKE 1 TABLET BY MOUTH IN THE EVENING 30 tablet 6  . sertraline (ZOLOFT) 50 MG tablet Take 1 tablet (50 mg total) by mouth daily. 30 tablet 3   No current facility-administered medications on file prior to visit.      Objective:  Objective  Physical Exam Vitals signs and nursing note reviewed.  Constitutional:      General: He is sleeping.     Appearance: He is well-developed.  HENT:     Head: Normocephalic and atraumatic.  Eyes:     Pupils: Pupils are equal, round, and reactive to light.  Neck:     Musculoskeletal: Normal range of motion and neck supple.     Thyroid: No thyromegaly.  Cardiovascular:     Rate and Rhythm: Normal rate and regular rhythm.     Heart sounds: No murmur.  Pulmonary:     Effort: Pulmonary effort is normal. No respiratory distress.     Breath sounds: Normal  breath sounds. No wheezing or rales.  Chest:     Chest wall: No tenderness.  Musculoskeletal:        General: No tenderness.  Skin:    General: Skin is warm and dry.  Neurological:     Mental Status: He is oriented to person, place, and time.  Psychiatric:        Behavior: Behavior normal.        Thought Content: Thought content normal.        Judgment: Judgment normal.    BP 122/80 (BP Location: Left Arm, Patient Position: Sitting, Cuff Size: Normal)   Pulse 81   Ht 5\' 9"  (1.753 m)   Wt 218 lb (98.9 kg)   SpO2 97%   BMI 32.19 kg/m  Wt Readings from Last 3 Encounters:  07/31/18 218 lb  (98.9 kg)  07/27/18 216 lb (98 kg)  07/09/18 220 lb 4 oz (99.9 kg)     Lab Results  Component Value Date   WBC 3.7 (L) 09/26/2017   HGB 14.5 09/26/2017   HCT 43.3 09/26/2017   PLT 186.0 09/26/2017   GLUCOSE 85 07/07/2018   CHOL 202 (H) 07/07/2018   TRIG 97.0 07/07/2018   HDL 55.60 07/07/2018   LDLDIRECT 133.7 07/01/2012   LDLCALC 127 (H) 07/07/2018   ALT 21 07/07/2018   AST 20 07/07/2018   NA 142 07/07/2018   K 4.1 07/07/2018   CL 106 07/07/2018   CREATININE 1.28 07/07/2018   BUN 12 07/07/2018   CO2 30 07/07/2018   TSH 1.81 09/23/2016   PSA 2.35 09/26/2017   HGBA1C 5.8 01/27/2018   MICROALBUR <0.7 07/26/2014    Dg Knee Ap/lat W/sunrise Right  Result Date: 08/19/2016 CLINICAL DATA:  Pain for 2 years EXAM: RIGHT KNEE 3 VIEWS COMPARISON:  None. FINDINGS: Standing frontal, standing lateral, and sunrise patellar images obtained. No fracture or dislocation. No joint effusion. Joint spaces appear normal. No erosive change. IMPRESSION: No fracture or dislocation.  No apparent arthropathy. Electronically Signed   By: Lowella Grip III M.D.   On: 08/19/2016 15:41     Assessment & Plan:  Plan  I am having Juan Hartmann. Velazquez maintain his levocetirizine, diclofenac, clotrimazole-betamethasone, atorvastatin, diclofenac sodium, sertraline, and doxycycline.  No orders of the defined types were placed in this encounter.   Problem List Items Addressed This Visit      Unprioritized   Depression, major, single episode, mild ([place])    Pt con't with group counseling and indiv counseling He prefers no meds at this time and we both feel sleep apnea may be contributing and worsening the symptoms       Insomnia - Primary    With snoring, daytime somnolence Refer to pulm for sleep eval        Relevant Orders   Ambulatory referral to Pulmonology      Follow-up: Return if symptoms worsen or fail to improve.  Ann Held, DO

## 2018-08-01 DIAGNOSIS — G4700 Insomnia, unspecified: Secondary | ICD-10-CM | POA: Insufficient documentation

## 2018-08-01 NOTE — Assessment & Plan Note (Addendum)
With snoring, daytime somnolence Refer to pulm for sleep eval

## 2018-08-01 NOTE — Assessment & Plan Note (Signed)
Pt con't with group counseling and indiv counseling He prefers no meds at this time and we both feel sleep apnea may be contributing and worsening the symptoms

## 2018-09-24 ENCOUNTER — Institutional Professional Consult (permissible substitution): Payer: Federal, State, Local not specified - PPO | Admitting: Pulmonary Disease

## 2018-09-29 ENCOUNTER — Encounter: Payer: Federal, State, Local not specified - PPO | Admitting: Family Medicine

## 2019-03-18 ENCOUNTER — Other Ambulatory Visit: Payer: Self-pay

## 2019-03-18 ENCOUNTER — Ambulatory Visit (INDEPENDENT_AMBULATORY_CARE_PROVIDER_SITE_OTHER): Payer: Federal, State, Local not specified - PPO | Admitting: Family Medicine

## 2019-03-18 ENCOUNTER — Encounter: Payer: Self-pay | Admitting: Family Medicine

## 2019-03-18 DIAGNOSIS — F418 Other specified anxiety disorders: Secondary | ICD-10-CM

## 2019-03-18 NOTE — Progress Notes (Signed)
Virtual Visit via Video Note  I connected with Juan Velazquez on 03/18/19 at  3:00 PM EDT by a video enabled telemedicine application and verified that I am speaking with the correct person using two identifiers.  Location: Patient: home  Provider: office    I discussed the limitations of evaluation and management by telemedicine and the availability of in person appointments. The patient expressed understanding and agreed to proceed.  History of Present Illness: Pt is home --- his dad has moved in with him--- he is starting to have memory issues.  The pt c/o inc anxiety with everything going on in the world now.  He is not taking his zoloft and really does not want to.  He has an appointment with the hospice counselor Monday am.    Observations/Objective: Vitals:   03/18/19 1433  Temp: 98.8 F (37.1 C)    pt is in NAD He does not seem depressed. Speech is normal Not suicidal  Assessment and Plan: 1. Depression with anxiety Pt prefers no meds He will see counselor Monday and discuss this with her   Follow Up Instructions:    I discussed the assessment and treatment plan with the patient. The patient was provided an opportunity to ask questions and all were answered. The patient agreed with the plan and demonstrated an understanding of the instructions.   The patient was advised to call back or seek an in-person evaluation if the symptoms worsen or if the condition fails to improve as anticipated.  I provided 15 minutes of non-face-to-face time during this encounter.   Ann Held, DO

## 2019-03-18 NOTE — Assessment & Plan Note (Signed)
Pt prefers no meds He will see counselor Monday and discuss this with her

## 2019-03-22 ENCOUNTER — Encounter: Payer: Self-pay | Admitting: Pulmonary Disease

## 2019-03-22 ENCOUNTER — Ambulatory Visit (INDEPENDENT_AMBULATORY_CARE_PROVIDER_SITE_OTHER): Payer: Federal, State, Local not specified - PPO | Admitting: Pulmonary Disease

## 2019-03-22 ENCOUNTER — Other Ambulatory Visit: Payer: Self-pay

## 2019-03-22 VITALS — BP 110/60 | HR 68 | Temp 97.9°F | Ht 69.0 in | Wt 223.0 lb

## 2019-03-22 DIAGNOSIS — R0683 Snoring: Secondary | ICD-10-CM | POA: Diagnosis not present

## 2019-03-22 NOTE — Patient Instructions (Signed)
Will arrange for home sleep study Will call to arrange for follow up after sleep study reviewed  

## 2019-03-22 NOTE — Progress Notes (Signed)
  Subjective:     Patient ID: Juan Velazquez, male   DOB: 06-Aug-1965, 53 y.o.   MRN: [id number]  HPI   Review of Systems  Constitutional: Negative.   HENT: Negative.   Eyes: Negative.   Respiratory: Negative.   Cardiovascular: Negative.   Gastrointestinal: Negative.   Endocrine: Negative.   Genitourinary: Negative.   Musculoskeletal: Negative.   Skin: Negative.   Allergic/Immunologic: Positive for environmental allergies.  Neurological: Negative.   Hematological: Negative.   Psychiatric/Behavioral: Negative.        Objective:   Physical Exam     Assessment:         Plan:

## 2019-03-22 NOTE — Progress Notes (Signed)
[facility]  Chief Complaint  Patient presents with   Consult    Insomnia    Constitutional:  BP 110/60 (BP Location: Right Arm, Patient Position: Sitting, Cuff Size: Normal)    Pulse 68    Temp 97.9 F (36.6 C)    Ht 5\' 9"  (1.753 m)    Wt 223 lb (101.2 kg)    SpO2 100% Comment: on room air   BMI 32.93 kg/m   Past Medical History:  Colon polyps, HLD, GERD, Asthma, Allergies, Depression  Brief Summary:  Juan Velazquez is a 53 y.o. male with snoring.  He has noticed trouble with his sleep for years.  He used to work 3rd shift and thought his sleep issues might have been related to work shift.  He changed to day shift, but sleep issues persisted.  He can fall asleep easily.  He has trouble staying asleep and then feels sleepy during the day.  He had a sleep study in 2011 at [facility] which he was told was negative.  He feels his sleep issues have progressed since then.  His wife says that he snores and will get shallow breathing at night.  He does feel sleepy during the day.  He goes to sleep at 10 pm.  He falls asleep in 30 minutes.  He wakes up 2 or 3 times to use the bathroom.  He gets out of bed at 545 am.  He sleeps later on weekends, but feels the same.  He feels tired in the morning.  He denies morning headache.  He does not use anything to help him stay awake.  He takes benadryl at night to help with allergies and this helps his sleep also.  He had recent dental evaluation and was asked by dentist if he ever had assessment for sleep apnea due to wear on his teeth.  He denies sleep walking, sleep talking, or nightmares.  There is no history of restless legs.  He denies sleep hallucinations, sleep paralysis, or cataplexy.  The Epworth score is 5 out of 24.     Physical Exam:   Appearance - well kempt   ENMT - clear nasal mucosa, midline nasal  septum, no oral exudates, no LAN, trachea midline, MP 3, enlarged tongue with  scalloped border  Respiratory - normal chest wall, normal respiratory effort, no accessory muscle use, no wheeze/rales  CV - s1s2 regular rate and rhythm, no murmurs, no peripheral edema, radial pulses symmetric  GI - soft, non tender, no masses  Lymph - no adenopathy noted in neck and axillary areas  MSK - normal gait  Ext - no cyanosis, clubbing, or joint inflammation noted  Skin - no rashes, lesions, or ulcers  Neuro - normal strength, oriented x 3  Psych - normal mood and affect  Discussion:  He has snoring, sleep disruption, apnea, daytime sleepiness and nocturia.  He has history of depression.  He has sleep maintenance insomnia.  I am concerned this could actually be related to obstructive sleep apnea.  Assessment/Plan:   Snoring with excessive daytime sleepiness. - will need to arrange for a home sleep study  Obesity. - discussed how weight can impact sleep and risk for sleep disordered breathing - discussed options to assist with weight loss: combination of diet modification, cardiovascular and strength training exercises  Cardiovascular risk. - had an extensive discussion regarding the adverse health consequences related to untreated sleep disordered breathing - specifically discussed the risks for  hypertension, coronary artery disease, cardiac dysrhythmias, cerebrovascular disease, and diabetes - lifestyle modification discussed  Safe driving practices. - discussed how sleep disruption can increase risk of accidents, particularly when driving - safe driving practices were discussed  Therapies for obstructive sleep apnea. - if the sleep study shows significant sleep apnea, then various therapies for treatment were reviewed: CPAP, oral appliance, and surgical interventions      Patient Instructions  Will arrange for home sleep study Will call to arrange for follow up after sleep study reviewed     Chesley Mires, MD [place] Pager:  [phone] 03/22/2019, 4:48 PM  Flow Sheet    Sleep tests:    Review of Systems:  Constitutional: Negative.   HENT: Negative.   Eyes: Negative.   Respiratory: Negative.   Cardiovascular: Negative.   Gastrointestinal: Negative.   Endocrine: Negative.   Genitourinary: Negative.   Musculoskeletal: Negative.   Skin: Negative.   Allergic/Immunologic: Positive for environmental allergies.  Neurological: Negative.   Hematological: Negative.   Psychiatric/Behavioral: Negative.    Medications:   Allergies as of 03/22/2019   No Known Allergies     Medication List       Accurate as of March 22, 2019  4:48 PM. If you have any questions, ask your nurse or doctor.        atorvastatin 80 MG tablet Commonly known as: LIPITOR TAKE 1 TABLET BY MOUTH EVERY DAY   clotrimazole-betamethasone cream Commonly known as: Lotrisone Apply 1 application topically 2 (two) times daily.   diclofenac 75 MG EC tablet Commonly known as: VOLTAREN Take 1 tablet by mouth as needed.   diclofenac sodium 1 % Gel Commonly known as: VOLTAREN Apply 4 g topically 4 (four) times daily.   levocetirizine 5 MG tablet Commonly known as: XYZAL TAKE 1 TABLET BY MOUTH IN THE EVENING   sertraline 50 MG tablet Commonly known as: ZOLOFT Take 1 tablet (50 mg total) by mouth daily.       Past Surgical History:  He  has a past surgical history that includes Achilles tendon repair (Right, 10/2011).  Family History:  His family history includes Cancer in an other family member; Colon cancer in his maternal aunt; Diabetes in his brother, mother, and sister; Diabetes type II in his brother; Heart disease in his father; Hypertension in his brother and mother; Kidney disease in his mother.  Social History:  He  reports that he has never smoked. He has never used smokeless tobacco. He reports current alcohol use. He reports that he does not use drugs.

## 2019-04-08 ENCOUNTER — Ambulatory Visit: Payer: Federal, State, Local not specified - PPO

## 2019-04-08 ENCOUNTER — Other Ambulatory Visit: Payer: Self-pay

## 2019-04-08 DIAGNOSIS — R0683 Snoring: Secondary | ICD-10-CM

## 2019-04-08 DIAGNOSIS — G4733 Obstructive sleep apnea (adult) (pediatric): Secondary | ICD-10-CM

## 2019-04-09 ENCOUNTER — Telehealth: Payer: Self-pay | Admitting: Pulmonary Disease

## 2019-04-09 DIAGNOSIS — G4733 Obstructive sleep apnea (adult) (pediatric): Secondary | ICD-10-CM

## 2019-04-09 NOTE — Telephone Encounter (Signed)
HST 04/08/19 >> AHI 5.3, SpO2 low 89%   Please inform him that his sleep study shows mild obstructive sleep apnea.  Please arrange for ROV with me or NP to discuss treatment options.

## 2019-04-20 NOTE — Telephone Encounter (Signed)
Called the patient and made him aware of the results. Patient voiced understanding. Has been scheduled to see Dr. Halford Chessman 04/29/19 at 9:45. Nothing further needed at this time.

## 2019-04-29 ENCOUNTER — Ambulatory Visit: Payer: Federal, State, Local not specified - PPO | Admitting: Pulmonary Disease

## 2019-04-29 ENCOUNTER — Encounter: Payer: Self-pay | Admitting: Pulmonary Disease

## 2019-04-29 ENCOUNTER — Other Ambulatory Visit: Payer: Self-pay

## 2019-04-29 VITALS — BP 132/84 | HR 60 | Temp 98.0°F | Ht 69.0 in | Wt 225.8 lb

## 2019-04-29 DIAGNOSIS — G4733 Obstructive sleep apnea (adult) (pediatric): Secondary | ICD-10-CM | POA: Diagnosis not present

## 2019-04-29 DIAGNOSIS — Z7189 Other specified counseling: Secondary | ICD-10-CM | POA: Diagnosis not present

## 2019-04-29 HISTORY — DX: Obstructive sleep apnea (adult) (pediatric): G47.33

## 2019-04-29 NOTE — Patient Instructions (Signed)
Will arrange for CPAP set up Follow up in 2 months 

## 2019-04-29 NOTE — Progress Notes (Signed)
[facility]  Chief Complaint  Patient presents with  . Follow-up    Constitutional:  BP 132/84 (BP Location: Left Arm, Cuff Size: Large)   Pulse 60   Temp 98 F (36.7 C) (Oral)   Ht 5\' 9"  (1.753 m)   Wt 225 lb 12.8 oz (102.4 kg)   SpO2 99%   BMI 33.34 kg/m   Past Medical History:  Colon polyps, HLD, GERD, Asthma, Allergies, Depression  Brief Summary:  Juan Velazquez is a 53 y.o. male with obstructive sleep apnea.  He had home sleep study 04/08/19.  Showed mild obstructive sleep apnea.  He is still having trouble with his sleep and feeling sleepy during the day.  He was told by his dentist that he had some oral findings suggestive of sleep apnea.  Physical Exam:   Appearance - well kempt   ENMT - clear nasal mucosa, midline nasal  septum, no oral exudates, no LAN, trachea midline, MP 3, enlarged tongue with scalloped border  Respiratory - normal chest wall, normal respiratory effort, no accessory muscle use, no wheeze/rales  CV - s1s2 regular rate and rhythm, no murmurs, no peripheral edema, radial pulses symmetric  GI - soft, non tender, no masses  Lymph - no adenopathy noted in neck and axillary areas  MSK - normal gait  Ext - no cyanosis, clubbing, or joint inflammation noted  Skin - no rashes, lesions, or ulcers  Neuro - normal strength, oriented x 3  Psych - normal mood and affect   Assessment/Plan:   Obstructive sleep apnea. - sleep study shows mild OSA -  various therapies for treatment were reviewed: CPAP, oral appliance, and surgical interventions - will arrange for auto CPAP set up  Obesity. - discussed importance of weight loss   Patient Instructions  Will arrange for CPAP set up  Follow up in 2 months    Chesley Mires, MD [place] Pager: [phone] 04/29/2019, 10:23 AM  Flow Sheet    Sleep tests:  HST 04/08/19 >> AHI 5.3, SpO2 low 89%   Medications:    Allergies as of 04/29/2019   No Known Allergies     Medication List       Accurate as of April 29, 2019 10:23 AM. If you have any questions, ask your nurse or doctor.        STOP taking these medications   atorvastatin 80 MG tablet Commonly known as: LIPITOR Stopped by: Chesley Mires, MD     TAKE these medications   clotrimazole-betamethasone cream Commonly known as: Lotrisone Apply 1 application topically 2 (two) times daily.   diclofenac 75 MG EC tablet Commonly known as: VOLTAREN Take 1 tablet by mouth as needed.   diclofenac sodium 1 % Gel Commonly known as: VOLTAREN Apply 4 g topically 4 (four) times daily.   levocetirizine 5 MG tablet Commonly known as: XYZAL TAKE 1 TABLET BY MOUTH IN THE EVENING   sertraline 50 MG tablet Commonly known as: ZOLOFT Take 1 tablet (50 mg total) by mouth daily.       Past Surgical History:  He  has a past surgical history that includes Achilles tendon repair (Right, 10/2011).  Family History:  His family history includes Cancer in an other family member; Colon cancer in his maternal aunt; Diabetes in his brother, mother, and sister; Diabetes type II in his brother; Heart disease in his father; Hypertension in his brother and mother; Kidney disease in his mother.  Social History:  He  reports that he has never smoked. He has never used smokeless tobacco. He reports current alcohol use. He reports that he does not use drugs.

## 2019-05-25 ENCOUNTER — Encounter: Payer: Self-pay | Admitting: Family Medicine

## 2019-05-25 ENCOUNTER — Other Ambulatory Visit: Payer: Self-pay

## 2019-05-25 ENCOUNTER — Other Ambulatory Visit (HOSPITAL_COMMUNITY)
Admission: RE | Admit: 2019-05-25 | Discharge: 2019-05-25 | Disposition: A | Discharge status: Home / Self Care | Payer: Federal, State, Local not specified - PPO | Source: Ambulatory Visit | Attending: Family Medicine | Admitting: Family Medicine

## 2019-05-25 ENCOUNTER — Ambulatory Visit (INDEPENDENT_AMBULATORY_CARE_PROVIDER_SITE_OTHER): Payer: Federal, State, Local not specified - PPO | Admitting: Family Medicine

## 2019-05-25 VITALS — Ht 69.0 in | Wt 218.0 lb

## 2019-05-25 DIAGNOSIS — Z202 Contact with and (suspected) exposure to infections with a predominantly sexual mode of transmission: Secondary | ICD-10-CM

## 2019-05-25 DIAGNOSIS — Z0000 Encounter for general adult medical examination without abnormal findings: Secondary | ICD-10-CM | POA: Diagnosis not present

## 2019-05-25 DIAGNOSIS — Z1211 Encounter for screening for malignant neoplasm of colon: Secondary | ICD-10-CM

## 2019-05-25 LAB — CBC WITH DIFFERENTIAL/PLATELET
Basophils Absolute: 0 10*3/uL (ref 0.0–0.1)
Basophils Relative: 0.9 % (ref 0.0–3.0)
Eosinophils Absolute: 0.1 10*3/uL (ref 0.0–0.7)
Eosinophils Relative: 2.1 % (ref 0.0–5.0)
HCT: 44 % (ref 39.0–52.0)
Hemoglobin: 14.5 g/dL (ref 13.0–17.0)
Lymphocytes Relative: 27.1 % (ref 12.0–46.0)
Lymphs Abs: 0.9 10*3/uL (ref 0.7–4.0)
MCHC: 32.8 g/dL (ref 30.0–36.0)
MCV: 88.6 fl (ref 78.0–100.0)
Monocytes Absolute: 0.3 10*3/uL (ref 0.1–1.0)
Monocytes Relative: 9.8 % (ref 3.0–12.0)
Neutro Abs: 2.1 10*3/uL (ref 1.4–7.7)
Neutrophils Relative %: 60.1 % (ref 43.0–77.0)
Platelets: 194 10*3/uL (ref 150.0–400.0)
RBC: 4.97 Mil/uL (ref 4.22–5.81)
RDW: 13.5 % (ref 11.5–15.5)
WBC: 3.4 10*3/uL — ABNORMAL LOW (ref 4.0–10.5)

## 2019-05-25 LAB — COMPREHENSIVE METABOLIC PANEL
ALT: 25 U/L (ref 0–53)
AST: 25 U/L (ref 0–37)
Albumin: 4.3 g/dL (ref 3.5–5.2)
Alkaline Phosphatase: 74 U/L (ref 39–117)
BUN: 11 mg/dL (ref 6–23)
CO2: 29 mEq/L (ref 19–32)
Calcium: 9.3 mg/dL (ref 8.4–10.5)
Chloride: 102 mEq/L (ref 96–112)
Creatinine, Ser: 1.25 mg/dL (ref 0.40–1.50)
GFR: 73.13 mL/min (ref >60.00)
Glucose, Bld: 97 mg/dL (ref 70–99)
Potassium: 3.8 mEq/L (ref 3.5–5.1)
Sodium: 138 mEq/L (ref 135–145)
Total Bilirubin: 0.7 mg/dL (ref 0.2–1.2)
Total Protein: 7.1 g/dL (ref 6.0–8.3)

## 2019-05-25 LAB — LIPID PANEL
Cholesterol: 221 mg/dL — ABNORMAL HIGH (ref 0–200)
HDL: 56 mg/dL (ref >39.00)
LDL Cholesterol: 144 mg/dL — ABNORMAL HIGH (ref 0–99)
NonHDL: 164.64
Total CHOL/HDL Ratio: 4
Triglycerides: 105 mg/dL (ref 0.0–149.0)
VLDL: 21 mg/dL (ref 0.0–40.0)

## 2019-05-25 LAB — PSA: PSA: 5.34 ng/mL — ABNORMAL HIGH (ref 0.10–4.00)

## 2019-05-25 LAB — TSH: TSH: 2.33 u[IU]/mL (ref 0.35–4.50)

## 2019-05-25 NOTE — Progress Notes (Signed)
Virtual Visit via Video Note  I connected with Juan Velazquez on 05/25/19 at  9:00 AM EST by a video enabled telemedicine application and verified that I am speaking with the correct person using two identifiers.  Location: Patient: home  Provider: home    I discussed the limitations of evaluation and management by telemedicine and the availability of in person appointments. The patient expressed understanding and agreed to proceed.  History of Present Illness: Pt is home with no complaints Pt needs no refills today     Past Medical History:  Diagnosis Date  . Allergy   . Asthma   . Gastric polyp 08/06/2010  . GERD (gastroesophageal reflux disease)   . Hyperlipidemia   . Hyperplastic colonic polyp 09/01/2009  . OSA (obstructive sleep apnea) 04/29/2019   Social History   Socioeconomic History  . Marital status: Married    Spouse name: Not on file  . Number of children: 0  . Years of education: Not on file  . Highest education level: Not on file  Occupational History  . 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 Smoking status: Never Smoker  . Smokeless tobacco: Never Used  Substance and Sexual Activity  . Alcohol use: Yes    Alcohol/week: 0.0 standard drinks    Comment: rare  . Drug use: No  . Sexual activity: Yes    Partners: Female  Other Topics Concern  . Not on file  Social History Narrative   Exercise-- 3x a week   Social Determinants of Health   Financial Resource Strain:   . Difficulty of Paying Living Expenses: Not on file  Food Insecurity:   . Worried About Charity fundraiser in the Last Year: Not on file  . Ran Out of Food in the Last Year: Not on file  Transportation Needs:   . Lack of Transportation (Medical): Not on file  . Lack of Transportation (Non-Medical): Not on file  Physical Activity:   . Days of Exercise per Week: Not on file  . Minutes of Exercise per Session: Not on file  Stress:   .  Feeling of Stress : Not on file  Social Connections:   . Frequency of Communication with Friends and Family: Not on file  . Frequency of Social Gatherings with Friends and Family: Not on file  . Attends Religious Services: Not on file  . Active Member of Clubs or Organizations: Not on file  . Attends Archivist Meetings: Not on file  . Marital Status: Not on file  Intimate Partner Violence:   . Fear of Current or Ex-Partner: Not on file  . Emotionally Abused: Not on file  . Physically Abused: Not on file  . Sexually Abused: Not on file   Current Outpatient Medications on File Prior to Visit  Medication Sig Dispense Refill  . clotrimazole-betamethasone (LOTRISONE) cream Apply 1 application topically 2 (two) times daily. 30 g 1  . diclofenac (VOLTAREN) 75 MG EC tablet Take 1 tablet by mouth as needed.  1  . diclofenac sodium (VOLTAREN) 1 % GEL Apply 4 g topically 4 (four) times daily. 5 Tube 1  . levocetirizine (XYZAL) 5 MG tablet TAKE 1 TABLET BY MOUTH IN THE EVENING 30 tablet 6  . sertraline (ZOLOFT) 50 MG tablet Take 1 tablet (50 mg total) by mouth daily. 30 tablet 3   No current facility-administered medications on file prior to visit.   No Known Allergies Past  Surgical History:  Procedure Laterality Date  . ACHILLES TENDON REPAIR Right 10/2011   Observations/Objective: There were no vitals filed for this visit. bp 116/73  p 74  Afebrile   Assessment and Plan: 1. Possible exposure to STD Check labs and urine  - HIV antibody - HSV(herpes simplex vrs) 1+2 ab-IgG - RPR - Urine cytology ancillary only([facility]) - Hepatitis B Surface AntiGEN  2. Preventative health care ghm utd Check labs  - TSH - Lipid panel - CBC with Differential - Comprehensive metabolic panel - PSA  3. Colon cancer screening Pt due for colonoscopy Referral placed Previous colon reviewed  - Fecal occult blood, imunochemical([facility]); Future   Follow Up Instructions:     I discussed the assessment and treatment plan with the patient. The patient was provided an opportunity to ask questions and all were answered. The patient agreed with the plan and demonstrated an understanding of the instructions.   The patient was advised to call back or seek an in-person evaluation if the symptoms worsen or if the condition fails to improve as anticipated.  I provided >30 minutes of non-face-to-face time during this encounter.   Ann Held, DO

## 2019-05-26 ENCOUNTER — Other Ambulatory Visit: Payer: Self-pay | Admitting: Family Medicine

## 2019-05-26 DIAGNOSIS — R972 Elevated prostate specific antigen [PSA]: Secondary | ICD-10-CM

## 2019-05-26 LAB — URINE CYTOLOGY ANCILLARY ONLY
Chlamydia: NEGATIVE
Comment: NEGATIVE
Comment: NEGATIVE
Comment: NORMAL
Neisseria Gonorrhea: NEGATIVE
Trichomonas: NEGATIVE

## 2019-05-26 LAB — HSV(HERPES SIMPLEX VRS) I + II AB-IGG
HAV 1 IGG,TYPE SPECIFIC AB: >58 index — ABNORMAL HIGH
HSV 2 IGG,TYPE SPECIFIC AB: <0.9 index

## 2019-05-26 LAB — HEPATITIS B SURFACE ANTIGEN: Hepatitis B Surface Ag: NONREACTIVE

## 2019-05-26 LAB — RPR: RPR Ser Ql: NONREACTIVE

## 2019-05-26 LAB — HIV ANTIBODY (ROUTINE TESTING W REFLEX): HIV 1&2 Ab, 4th Generation: NONREACTIVE

## 2019-05-28 ENCOUNTER — Other Ambulatory Visit (INDEPENDENT_AMBULATORY_CARE_PROVIDER_SITE_OTHER): Payer: Federal, State, Local not specified - PPO

## 2019-05-28 DIAGNOSIS — Z1211 Encounter for screening for malignant neoplasm of colon: Secondary | ICD-10-CM

## 2019-05-28 LAB — FECAL OCCULT BLOOD, IMMUNOCHEMICAL: Fecal Occult Bld: NEGATIVE

## 2019-07-01 ENCOUNTER — Other Ambulatory Visit: Payer: Self-pay

## 2019-07-01 ENCOUNTER — Encounter: Payer: Self-pay | Admitting: Pulmonary Disease

## 2019-07-01 ENCOUNTER — Ambulatory Visit: Payer: Federal, State, Local not specified - PPO | Admitting: Pulmonary Disease

## 2019-07-01 VITALS — BP 120/90 | HR 58 | Temp 97.5°F | Ht 69.0 in | Wt 226.0 lb

## 2019-07-01 DIAGNOSIS — G4733 Obstructive sleep apnea (adult) (pediatric): Secondary | ICD-10-CM

## 2019-07-01 NOTE — Progress Notes (Signed)
[facility]  Chief Complaint  Patient presents with  . Follow-up    Patient is here for CPAP follow up. Patient has been having issues with the mask. States he couldn't find one that worked well for him. Patient states he sleeps about 5 hours but has not slept through the night since getting machine.     Constitutional:  BP 120/90 (BP Location: Right Arm, Patient Position: Sitting, Cuff Size: Normal)   Pulse (!) 58   Temp (!) 97.5 F (36.4 C) (Temporal)   Ht 5\' 9"  (1.753 m)   Wt 226 lb (102.5 kg)   SpO2 99% Comment: on RA  BMI 33.37 kg/m   Past Medical History:  Colon polyps, HLD, GERD, Asthma, Allergies, Depression  Brief Summary:  Juan Velazquez is a 54 y.o. male with obstructive sleep apnea.  He has been using CPAP.  Main issue is mask fit.  He started with full face mask, then nasal pillows, and back to full face.  Pressure is okay.  Not having sinus congestion, sore throat, dry mouth, or aerophagia.  Physical Exam:   Deferred.   Assessment/Plan:   Obstructive sleep apnea. - he has been using CPAP but still has difficulty with mask fit - will arrange for mask refitting at sleep lab - continue auto CPAP   Patient Instructions  Will arrange for mask refitting at the sleep lab   Follow up in 1 year    Chesley Mires, MD [place] Pager: [phone] 07/01/2019, 3:45 PM  Flow Sheet    Sleep tests:  HST 04/08/19 >> AHI 5.3, SpO2 low 89%  Auto CPAP 05/31/19 to 06/29/19 >> used on 20 of 30 nights with average 3 hr 18 min.  Average AHI 0.3 with median CPAP 6 and 95 th percentile CPAP 8 cm H2O.  Medications:   Allergies as of 07/01/2019   No Known Allergies     Medication List       Accurate as of July 01, 2019  3:45 PM. If you have any questions, ask your nurse or doctor.        STOP taking these medications   diclofenac 75 MG EC tablet Commonly known as: VOLTAREN Stopped by:  Chesley Mires, MD   diclofenac sodium 1 % Gel Commonly known as: VOLTAREN Stopped by: Chesley Mires, MD   levocetirizine 5 MG tablet Commonly known as: XYZAL Stopped by: Chesley Mires, MD     TAKE these medications   clotrimazole-betamethasone cream Commonly known as: Lotrisone Apply 1 application topically 2 (two) times daily.   sertraline 50 MG tablet Commonly known as: ZOLOFT Take 1 tablet (50 mg total) by mouth daily.       Past Surgical History:  He  has a past surgical history that includes Achilles tendon repair (Right, 10/2011).  Family History:  His family history includes Cancer in an other family member; Colon cancer in his maternal aunt; Diabetes in his brother, mother, and sister; Diabetes type II in his brother; Heart disease in his father; Hypertension in his brother and mother; Kidney disease in his mother.  Social History:  He  reports that he has never smoked. He has never used smokeless tobacco. He reports current alcohol use. He reports that he does not use drugs.

## 2019-07-01 NOTE — Patient Instructions (Signed)
Will arrange for mask refitting at the sleep lab   Follow up in 1 year

## 2019-07-27 ENCOUNTER — Other Ambulatory Visit (HOSPITAL_COMMUNITY): Payer: Federal, State, Local not specified - PPO

## 2019-07-28 ENCOUNTER — Other Ambulatory Visit (HOSPITAL_BASED_OUTPATIENT_CLINIC_OR_DEPARTMENT_OTHER): Payer: Federal, State, Local not specified - PPO | Admitting: Pulmonary Disease

## 2019-11-24 ENCOUNTER — Telehealth: Payer: Self-pay | Admitting: Family Medicine

## 2019-11-24 DIAGNOSIS — F320 Major depressive disorder, single episode, mild: Secondary | ICD-10-CM

## 2019-11-24 NOTE — Telephone Encounter (Signed)
Caller: Juan Velazquez Call back phone number: [phone]  Patient states he never took the medication but he is now interested can he get a refill?  Medication: sertraline (ZOLOFT) 50 MG tablet [      Has the patient contacted their pharmacy?  (If no, request that the patient contact the pharmacy for the refill.) (If yes, when and what did the pharmacy advise?)     Preferred Pharmacy (with phone number or street name): [id number] - [place], [place] - [address]  [address] [place] [address]  Phone:  [phone] Fax:  [phone]      Agent: Please be advised that RX refills may take up to 3 business days. We ask that you follow-up with your pharmacy.

## 2019-11-24 NOTE — Telephone Encounter (Signed)
Pt had a appt with you in January for a CPE and med was ordered in Feb 2020. Okay to send or would you like a appointment?

## 2019-11-25 MED ORDER — SERTRALINE HCL 50 MG PO TABS: 50.0000 mg | ORAL_TABLET | Freq: Every day | ORAL | 1 refills | Status: DC

## 2019-11-25 NOTE — Telephone Encounter (Signed)
Ok to refill  F/u with ov in 1 month---- virtual ok

## 2019-11-25 NOTE — Telephone Encounter (Signed)
Spoke with patient. Refill sent and 1 month f/u appt made.

## 2019-12-27 ENCOUNTER — Encounter: Payer: Self-pay | Admitting: Family Medicine

## 2019-12-27 ENCOUNTER — Telehealth (INDEPENDENT_AMBULATORY_CARE_PROVIDER_SITE_OTHER): Payer: Federal, State, Local not specified - PPO | Admitting: Family Medicine

## 2019-12-27 ENCOUNTER — Other Ambulatory Visit: Payer: Self-pay

## 2019-12-27 DIAGNOSIS — F320 Major depressive disorder, single episode, mild: Secondary | ICD-10-CM

## 2019-12-27 MED ORDER — SERTRALINE HCL 50 MG PO TABS: 50.0000 mg | ORAL_TABLET | Freq: Every day | ORAL | 3 refills | Status: DC

## 2019-12-27 NOTE — Progress Notes (Signed)
Virtual Visit via Video Note  I connected with Juan Velazquez on 12/27/19 at  8:00 AM EDT by a video enabled telemedicine application and verified that I am speaking with the correct person using two identifiers.  Location: Patient: home alone Provider: office    I discussed the limitations of evaluation and management by telemedicine and the availability of in person appointments. The patient expressed understanding and agreed to proceed.  History of Present Illness: Pt is home --- doing well.  He got through the [place] of his wife's death, funeral and bday and just got back from vacation.  He is a little concerned about the holidays and how he will react to that but overall he is doing well.    Observations/Objective: Vitals:   12/27/19 0810  Temp: 97.8 F (36.6 C)   Pt is doing well , no compliants  He is not suicidal  Assessment and Plan: 1. Depression, major, single episode, mild (HCC) Doing well con't meds F/u 3 months  - sertraline (ZOLOFT) 50 MG tablet; Take 1 tablet (50 mg total) by mouth daily.  Dispense: 90 tablet; Refill: 3   Follow Up Instructions:    I discussed the assessment and treatment plan with the patient. The patient was provided an opportunity to ask questions and all were answered. The patient agreed with the plan and demonstrated an understanding of the instructions.   The patient was advised to call back or seek an in-person evaluation if the symptoms worsen or if the condition fails to improve as anticipated.  I provided 25 minutes of non-face-to-face time during this encounter.   Ann Held, DO

## 2020-03-30 ENCOUNTER — Encounter: Payer: Self-pay | Admitting: Family Medicine

## 2020-03-30 ENCOUNTER — Telehealth (INDEPENDENT_AMBULATORY_CARE_PROVIDER_SITE_OTHER): Payer: Federal, State, Local not specified - PPO | Admitting: Family Medicine

## 2020-03-30 ENCOUNTER — Other Ambulatory Visit: Payer: Self-pay

## 2020-03-30 VITALS — BP 116/69 | HR 63 | Temp 97.1°F | Ht 69.0 in | Wt 221.0 lb

## 2020-03-30 DIAGNOSIS — Z3009 Encounter for other general counseling and advice on contraception: Secondary | ICD-10-CM | POA: Diagnosis not present

## 2020-03-30 DIAGNOSIS — F32A Depression, unspecified: Secondary | ICD-10-CM | POA: Diagnosis not present

## 2020-03-30 NOTE — Progress Notes (Signed)
Virtual Visit via Video Note  I connected with Juan Velazquez on 03/30/20 at  8:00 AM EST by a video enabled telemedicine application and verified that I am speaking with the correct person using two identifiers.  Location: Patient: home alone Provider: office    I discussed the limitations of evaluation and management by telemedicine and the availability of in person appointments. The patient expressed understanding and agreed to proceed.  History of Present Illness: Pt is home f/u depression    He is doing well and is happy with the zoloft.  He has not moved yet but hopes to in the next few months. He is also requesting a referral for a vasectomy     Observations/Objective: Vitals:   03/30/20 0806  BP: 116/69  Pulse: 63  Temp: (!) 97.1 F (36.2 C)   Pt is in nad Pt is happy and doing very well with the zoloft No suicidal thoughts  Assessment and Plan: 1. Depression, unspecified depression type Stable con't zoloft F/u 6 months or sooner prn     . 2Vasectomy evaluation  - Ambulatory referral to Urology   Follow Up Instructions:    I discussed the assessment and treatment plan with the patient. The patient was provided an opportunity to ask questions and all were answered. The patient agreed with the plan and demonstrated an understanding of the instructions.   The patient was advised to call back or seek an in-person evaluation if the symptoms worsen or if the condition fails to improve as anticipated.  I provided 25 minutes of non-face-to-face time during this encounter.   Ann Held, DO

## 2020-04-14 ENCOUNTER — Encounter: Payer: Self-pay | Admitting: Family Medicine

## 2020-04-28 ENCOUNTER — Encounter: Payer: Self-pay | Admitting: Family Medicine

## 2020-05-18 ENCOUNTER — Encounter: Payer: Self-pay | Admitting: Family Medicine

## 2020-12-22 ENCOUNTER — Encounter: Payer: Federal, State, Local not specified - PPO | Admitting: Family Medicine

## 2020-12-28 ENCOUNTER — Other Ambulatory Visit: Payer: Self-pay

## 2020-12-29 ENCOUNTER — Ambulatory Visit (INDEPENDENT_AMBULATORY_CARE_PROVIDER_SITE_OTHER): Payer: Federal, State, Local not specified - PPO | Admitting: Family Medicine

## 2020-12-29 ENCOUNTER — Encounter: Payer: Self-pay | Admitting: Family Medicine

## 2020-12-29 VITALS — BP 118/82 | HR 67 | Temp 98.1°F | Resp 18 | Ht 69.0 in | Wt 226.2 lb

## 2020-12-29 DIAGNOSIS — Z1159 Encounter for screening for other viral diseases: Secondary | ICD-10-CM

## 2020-12-29 DIAGNOSIS — F418 Other specified anxiety disorders: Secondary | ICD-10-CM | POA: Diagnosis not present

## 2020-12-29 DIAGNOSIS — F320 Major depressive disorder, single episode, mild: Secondary | ICD-10-CM | POA: Diagnosis not present

## 2020-12-29 DIAGNOSIS — Z0000 Encounter for general adult medical examination without abnormal findings: Secondary | ICD-10-CM

## 2020-12-29 MED ORDER — SERTRALINE HCL 50 MG PO TABS: 50.0000 mg | ORAL_TABLET | Freq: Every day | ORAL | 3 refills | Status: DC

## 2020-12-29 NOTE — Patient Instructions (Signed)
Preventive Care 40-54 Years Old, Male Preventive care refers to lifestyle choices and visits with your health care provider that can promote health and wellness. This includes: A yearly physical exam. This is also called an annual wellness visit. Regular dental and eye exams. Immunizations. Screening for certain conditions. Healthy lifestyle choices, such as: Eating a healthy diet. Getting regular exercise. Not using drugs or products that contain nicotine and tobacco. Limiting alcohol use. What can I expect for my preventive care visit? Physical exam Your health care provider will check your: Height and weight. These may be used to calculate your BMI (body mass index). BMI is a measurement that tells if you are at a healthy weight. Heart rate and blood pressure. Body temperature. Skin for abnormal spots. Counseling Your health care provider may ask you questions about your: Past medical problems. Family's medical history. Alcohol, tobacco, and drug use. Emotional well-being. Home life and relationship well-being. Sexual activity. Diet, exercise, and sleep habits. Work and work environment. Access to firearms. What immunizations do I need?  Vaccines are usually given at various ages, according to a schedule. Your health care provider will recommend vaccines for you based on your age, medicalhistory, and lifestyle or other factors, such as travel or where you work. What tests do I need? Blood tests Lipid and cholesterol levels. These may be checked every 5 years, or more often if you are over 50 years old. Hepatitis C test. Hepatitis B test. Screening Lung cancer screening. You may have this screening every year starting at age 55 if you have a 30-pack-year history of smoking and currently smoke or have quit within the past 15 years. Prostate cancer screening. Recommendations will vary depending on your family history and other risks. Genital exam to check for testicular cancer  or hernias. Colorectal cancer screening. All adults should have this screening starting at age 50 and continuing until age 75. Your health care provider may recommend screening at age 45 if you are at increased risk. You will have tests every 1-10 years, depending on your results and the type of screening test. Diabetes screening. This is done by checking your blood sugar (glucose) after you have not eaten for a while (fasting). You may have this done every 1-3 years. STD (sexually transmitted disease) testing, if you are at risk. Follow these instructions at home: Eating and drinking  Eat a diet that includes fresh fruits and vegetables, whole grains, lean protein, and low-fat dairy products. Take vitamin and mineral supplements as recommended by your health care provider. Do not drink alcohol if your health care provider tells you not to drink. If you drink alcohol: Limit how much you have to 0-2 drinks a day. Be aware of how much alcohol is in your drink. In the [place] one drink equals one 12 oz bottle of beer (355 mL), one 5 oz glass of wine (148 mL), or one 1 oz glass of hard liquor (44 mL).  Lifestyle Take daily care of your teeth and gums. Brush your teeth every morning and night with fluoride toothpaste. Floss one time each day. Stay active. Exercise for at least 30 minutes 5 or more days each week. Do not use any products that contain nicotine or tobacco, such as cigarettes, e-cigarettes, and chewing tobacco. If you need help quitting, ask your health care provider. Do not use drugs. If you are sexually active, practice safe sex. Use a condom or other form of protection to prevent STIs (sexually transmitted infections). If told by   your health care provider, take low-dose aspirin daily starting at age 50. Find healthy ways to cope with stress, such as: Meditation, yoga, or listening to music. Journaling. Talking to a trusted person. Spending time with friends and  family. Safety Always wear your seat belt while driving or riding in a vehicle. Do not drive: If you have been drinking alcohol. Do not ride with someone who has been drinking. When you are tired or distracted. While texting. Wear a helmet and other protective equipment during sports activities. If you have firearms in your house, make sure you follow all gun safety procedures. What's next? Go to your health care provider once a year for an annual wellness visit. Ask your health care provider how often you should have your eyes and teeth checked. Stay up to date on all vaccines. This information is not intended to replace advice given to you by your health care provider. Make sure you discuss any questions you have with your healthcare provider. Document Revised: 02/02/2019 Document Reviewed: 04/30/2018 Elsevier Patient Education  2022 [employer]  

## 2020-12-29 NOTE — Assessment & Plan Note (Signed)
Pt doing well and would like to try to come off of it Take 1/2 tab daily and if he does well he can stop it

## 2020-12-29 NOTE — Assessment & Plan Note (Signed)
ghm utd Check labs  See AVs

## 2020-12-29 NOTE — Progress Notes (Signed)
Subjective:   By signing my name below, I, Juan Velazquez, attest that this documentation has been prepared under the direction and in the presence of Ann Held, DO. 12/29/2020   Patient ID: Juan Velazquez, male    DOB: 1965-11-11, 55 y.o.   MRN: [id number]  Chief Complaint  Patient presents with   Annual Exam    Pt states fasting     HPI Patient is in today for a comprehensive physical exam.  He recently moved to [place] with his father for his job and is happy living there.  He has been managing his mood with 50 mg Zoloft and mentions he has been feeling better. He believes his good mood is also due to the change in scenery.  He got a CPAP machine last year, tried it for 4 - 5 months and stopped using it because he was uncomfortable with the masks and having to sleep on his back. He also mentions he snores rarely.  He denies fever, hearing loss, ear pain,congestion, sinus pain, sore throat, eye pain, chest pain, palpitations, cough, shortness of breath, wheezing, nausea. vomiting, diarrhea, constipation, blood in stool, dysuria,frequency, hematuria and headaches.  He has 3 Moderna Covid-19 vaccines at this time. He is interested in getting the 1st dose of the shingles vaccines today.  Past Medical History:  Diagnosis Date   Allergy    Asthma    Gastric polyp 08/06/2010   GERD (gastroesophageal reflux disease)    Hyperlipidemia    Hyperplastic colonic polyp 09/01/2009   OSA (obstructive sleep apnea) 04/29/2019    Past Surgical History:  Procedure Laterality Date   ACHILLES TENDON REPAIR Right 10/2011    Family History  Problem Relation Age of Onset   Diabetes Mother    Hypertension Mother    Kidney disease Mother    Diabetes type II Brother    Hypertension Brother    Diabetes Brother    Diabetes Sister    Heart disease Father        defibrillator   Colon cancer Maternal Aunt    Cancer Other     Social History   Socioeconomic History    Marital status: Married    Spouse name: Not on file   Number of children: 0   Years of education: Not on file   Highest education level: Not on file  Occupational History   Occupation: dept [employer] --w/s    Employer: [employer]  Tobacco Use   Smoking status: Never   Smokeless tobacco: Never  Vaping Use   Vaping Use: Never used  Substance and Sexual Activity   Alcohol use: Yes    Alcohol/week: 0.0 standard drinks    Comment: rare   Drug use: No   Sexual activity: Yes    Partners: Female  Other Topics Concern   Not on file  Social History Narrative   Exercise-- 3x a week   Social Determinants of Health   Financial Resource Strain: Not on file  Food Insecurity: Not on file  Transportation Needs: Not on file  Physical Activity: Not on file  Stress: Not on file  Social Connections: Not on file  Intimate Partner Violence: Not on file    Outpatient Medications Prior to Visit  Medication Sig Dispense Refill   sertraline (ZOLOFT) 50 MG tablet Take 1 tablet (50 mg total) by mouth daily. 90 tablet 3   clotrimazole-betamethasone (LOTRISONE) cream Apply 1 application topically 2 (two) times daily. (Patient not taking: Reported on  12/29/2020) 30 g 1   No facility-administered medications prior to visit.    No Known Allergies  Review of Systems  Constitutional:  Negative for fever.  HENT:  Negative for congestion, ear pain, hearing loss, sinus pain and sore throat.   Eyes:  Negative for pain.  Respiratory:  Negative for cough, shortness of breath and wheezing.   Cardiovascular:  Negative for chest pain and palpitations.  Gastrointestinal:  Negative for blood in stool, constipation, diarrhea, nausea and vomiting.  Genitourinary:  Negative for dysuria, frequency and hematuria.  Neurological:  Negative for headaches.      Objective:    Physical Exam Constitutional:      General: He is not in acute distress.    Appearance: Normal appearance. He is not ill-appearing.   HENT:     Head: Normocephalic and atraumatic.     Right Ear: Tympanic membrane, ear canal and external ear normal.     Left Ear: Tympanic membrane, ear canal and external ear normal.  Eyes:     Pupils: Pupils are equal, round, and reactive to light.  Cardiovascular:     Rate and Rhythm: Normal rate and regular rhythm.     Pulses: Normal pulses.     Heart sounds: No murmur heard.   No gallop.  Pulmonary:     Effort: Pulmonary effort is normal. No respiratory distress.     Breath sounds: Normal breath sounds. No wheezing or rhonchi.  Abdominal:     General: Bowel sounds are normal. There is no distension.     Palpations: Abdomen is soft.     Tenderness: There is no abdominal tenderness. There is no guarding.     Hernia: No hernia is present.  Musculoskeletal:     Cervical back: Neck supple.  Lymphadenopathy:     Cervical: No cervical adenopathy.  Skin:    General: Skin is warm and dry.  Neurological:     Mental Status: He is alert and oriented to person, place, and time.    BP 118/82 (BP Location: Right Arm, Patient Position: Sitting, Cuff Size: Normal)   Pulse 67   Temp 98.1 F (36.7 C) (Oral)   Resp 18   Ht '5\' 9"'$  (1.753 m)   Wt 226 lb 3.2 oz (102.6 kg)   SpO2 97%   BMI 33.40 kg/m  Wt Readings from Last 3 Encounters:  12/29/20 226 lb 3.2 oz (102.6 kg)  03/30/20 221 lb (100.2 kg)  12/27/19 213 lb (96.6 kg)    Diabetic Foot Exam - Simple   No data filed    Lab Results  Component Value Date   WBC 3.4 (L) 05/25/2019   HGB 14.5 05/25/2019   HCT 44.0 05/25/2019   PLT 194.0 05/25/2019   GLUCOSE 97 05/25/2019   CHOL 221 (H) 05/25/2019   TRIG 105.0 05/25/2019   HDL 56.00 05/25/2019   LDLDIRECT 133.7 07/01/2012   LDLCALC 144 (H) 05/25/2019   ALT 25 05/25/2019   AST 25 05/25/2019   NA 138 05/25/2019   K 3.8 05/25/2019   CL 102 05/25/2019   CREATININE 1.25 05/25/2019   BUN 11 05/25/2019   CO2 29 05/25/2019   TSH 2.33 05/25/2019   PSA 5.34 (H) 05/25/2019    HGBA1C 5.8 01/27/2018   MICROALBUR <0.7 07/26/2014    Lab Results  Component Value Date   TSH 2.33 05/25/2019   Lab Results  Component Value Date   WBC 3.4 (L) 05/25/2019   HGB 14.5 05/25/2019   HCT 44.0  05/25/2019   MCV 88.6 05/25/2019   PLT 194.0 05/25/2019   Lab Results  Component Value Date   NA 138 05/25/2019   K 3.8 05/25/2019   CO2 29 05/25/2019   GLUCOSE 97 05/25/2019   BUN 11 05/25/2019   CREATININE 1.25 05/25/2019   BILITOT 0.7 05/25/2019   ALKPHOS 74 05/25/2019   AST 25 05/25/2019   ALT 25 05/25/2019   PROT 7.1 05/25/2019   ALBUMIN 4.3 05/25/2019   CALCIUM 9.3 05/25/2019   GFR 73.13 05/25/2019   Lab Results  Component Value Date   CHOL 221 (H) 05/25/2019   Lab Results  Component Value Date   HDL 56.00 05/25/2019   Lab Results  Component Value Date   LDLCALC 144 (H) 05/25/2019   Lab Results  Component Value Date   TRIG 105.0 05/25/2019   Lab Results  Component Value Date   CHOLHDL 4 05/25/2019   Lab Results  Component Value Date   HGBA1C 5.8 01/27/2018       Colonoscopy:  Last completed on 09/01/2012. Results showed mild diverticulosis in the sigmoid colon. Otherwise the colon was normal. Repeat in 10 years  Assessment & Plan:   Problem List Items Addressed This Visit       Unprioritized   Depression with anxiety    Pt doing well and would like to try to come off of it Take 1/2 tab daily and if he does well he can stop it       Relevant Medications   sertraline (ZOLOFT) 50 MG tablet   Preventative health care - Primary    ghm utd Check labs  See AVs       Relevant Orders   CBC with Differential/Platelet   Comprehensive metabolic panel   Lipid panel   PSA   TSH   Other Visit Diagnoses     Depression, major, single episode, mild (HCC)       Relevant Medications   sertraline (ZOLOFT) 50 MG tablet   Need for hepatitis C screening test       Relevant Orders   Hepatitis C antibody        Meds ordered this  encounter  Medications   sertraline (ZOLOFT) 50 MG tablet    Sig: Take 1 tablet (50 mg total) by mouth daily.    Dispense:  90 tablet    Refill:  3    I,Juan Velazquez,acting as a Education administrator for [employer], DO.,have documented all relevant documentation on the behalf of Ann Held, DO,as directed by  Ann Held, DO while in the presence of Ann Held, DO.   I, Ann Held, DO., personally preformed the services described in this documentation.  All medical record entries made by the scribe were at my direction and in my presence.  I have reviewed the chart and discharge instructions (if applicable) and agree that the record reflects my personal performance and is accurate and complete. 12/29/2020

## 2021-01-01 LAB — CBC WITH DIFFERENTIAL/PLATELET
Absolute Monocytes: 454 cells/uL (ref 200–950)
Basophils Absolute: 21 cells/uL (ref 0–200)
Basophils Relative: 0.5 %
Eosinophils Absolute: 59 cells/uL (ref 15–500)
Eosinophils Relative: 1.4 %
HCT: 45.5 % (ref 38.5–50.0)
Hemoglobin: 14.8 g/dL (ref 13.2–17.1)
Lymphs Abs: 1382 cells/uL (ref 850–3900)
MCH: 29.5 pg (ref 27.0–33.0)
MCHC: 32.5 g/dL (ref 32.0–36.0)
MCV: 90.6 fL (ref 80.0–100.0)
MPV: 10.9 fL (ref 7.5–12.5)
Monocytes Relative: 10.8 %
Neutro Abs: 2285 cells/uL (ref 1500–7800)
Neutrophils Relative %: 54.4 %
Platelets: 202 10*3/uL (ref 140–400)
RBC: 5.02 10*6/uL (ref 4.20–5.80)
RDW: 12.5 % (ref 11.0–15.0)
Total Lymphocyte: 32.9 %
WBC: 4.2 10*3/uL (ref 3.8–10.8)

## 2021-01-01 LAB — COMPREHENSIVE METABOLIC PANEL
AG Ratio: 1.5 (calc) (ref 1.0–2.5)
ALT: 21 U/L (ref 9–46)
AST: 25 U/L (ref 10–35)
Albumin: 4.1 g/dL (ref 3.6–5.1)
Alkaline phosphatase (APISO): 65 U/L (ref 35–144)
BUN: 10 mg/dL (ref 7–25)
CO2: 26 mmol/L (ref 20–32)
Calcium: 9.1 mg/dL (ref 8.6–10.3)
Chloride: 105 mmol/L (ref 98–110)
Creat: 1.24 mg/dL (ref 0.70–1.30)
Globulin: 2.7 g/dL (calc) (ref 1.9–3.7)
Glucose, Bld: 76 mg/dL (ref 65–99)
Potassium: 4.3 mmol/L (ref 3.5–5.3)
Sodium: 140 mmol/L (ref 135–146)
Total Bilirubin: 0.9 mg/dL (ref 0.2–1.2)
Total Protein: 6.8 g/dL (ref 6.1–8.1)

## 2021-01-01 LAB — HEPATITIS C ANTIBODY
Hepatitis C Ab: NONREACTIVE
SIGNAL TO CUT-OFF: 0.02 (ref <1.00)

## 2021-01-01 LAB — LIPID PANEL
Cholesterol: 210 mg/dL — ABNORMAL HIGH (ref <200)
HDL: 53 mg/dL (ref >=40)
LDL Cholesterol (Calc): 132 mg/dL (calc) — ABNORMAL HIGH
Non-HDL Cholesterol (Calc): 157 mg/dL (calc) — ABNORMAL HIGH (ref <130)
Total CHOL/HDL Ratio: 4 (calc) (ref <5.0)
Triglycerides: 132 mg/dL (ref <150)

## 2021-01-01 LAB — PSA: PSA: 3.49 ng/mL (ref <=4.00)

## 2021-01-01 LAB — TSH: TSH: 1.64 mIU/L (ref 0.40–4.50)

## 2021-01-02 ENCOUNTER — Encounter: Payer: Self-pay | Admitting: Family Medicine

## 2021-01-02 ENCOUNTER — Other Ambulatory Visit: Payer: Self-pay

## 2021-01-02 ENCOUNTER — Telehealth (INDEPENDENT_AMBULATORY_CARE_PROVIDER_SITE_OTHER): Payer: Federal, State, Local not specified - PPO | Admitting: Family Medicine

## 2021-01-02 VITALS — Temp 98.5°F

## 2021-01-02 DIAGNOSIS — U071 COVID-19: Secondary | ICD-10-CM

## 2021-01-02 MED ORDER — FLUTICASONE PROPIONATE HFA 110 MCG/ACT IN AERO: 2.0000 | INHALATION_SPRAY | Freq: Two times a day (BID) | RESPIRATORY_TRACT | 1 refills | Status: DC

## 2021-01-02 MED ORDER — BENZONATATE 100 MG PO CAPS: 100.0000 mg | ORAL_CAPSULE | Freq: Three times a day (TID) | ORAL | 0 refills | Status: DC | PRN

## 2021-01-02 NOTE — Progress Notes (Signed)
Chief Complaint  Patient presents with   Covid Positive   Cough    Tested positive 12/31/20   Fever    Juan Velazquez here for URI complaints. Due to COVID-19 pandemic, we are interacting via telephone. I verified patient's ID using 2 identifiers. Patient agreed to proceed with visit via this method. Patient is at work, I am at office. Patient and I are present for visit.   Duration: 2 days  Associated symptoms: Fever (100.5 F) and coughing- worse w deep inspiration Denies: sinus congestion, sinus pain, itchy watery eyes, ear pain, sore throat, myalgia, and N/V/D Treatment to date: Tylenol, Mucinex cough syrup Sick contacts: No Tested + for covid on 8/14.  Triple vaccinated.   Past Medical History:  Diagnosis Date   Allergy    Gastric polyp 08/06/2010   GERD (gastroesophageal reflux disease)    Hyperplastic colonic polyp 09/01/2009   Objective Temp 98.5 F (36.9 C) (Oral)  No conversational dyspnea Age appropriate judgment and insight Nml affect and mood  COVID-19 - Plan: fluticasone (FLOVENT HFA) 110 MCG/ACT inhaler, benzonatate (TESSALON) 100 MG capsule  Continue to push fluids, practice good hand hygiene, cover mouth when coughing. F/u prn. If starting to experience fevers, shaking, or shortness of breath, seek immediate care. Total time: 11 min Pt voiced understanding and agreement to the plan.  [place], DO 01/02/21 1:15 PM

## 2021-01-05 ENCOUNTER — Telehealth: Payer: Self-pay | Admitting: Family Medicine

## 2021-01-05 ENCOUNTER — Other Ambulatory Visit: Payer: Self-pay | Admitting: Family Medicine

## 2021-01-05 DIAGNOSIS — Z23 Encounter for immunization: Secondary | ICD-10-CM

## 2021-01-05 MED ORDER — SHINGRIX 50 MCG/0.5ML IM SUSR: INTRAMUSCULAR | 1 refills | Status: DC

## 2021-01-05 NOTE — Telephone Encounter (Signed)
Pt called. LVM

## 2021-01-05 NOTE — Telephone Encounter (Signed)
[facility] is [facility]. Address [facility]  [id number] [place] [facility] [id number] [address] [address] Phone:  [phone] Fax:  [phone]

## 2021-03-09 ENCOUNTER — Other Ambulatory Visit: Payer: Self-pay | Admitting: Family Medicine

## 2021-03-09 DIAGNOSIS — U071 COVID-19: Secondary | ICD-10-CM

## 2021-05-25 ENCOUNTER — Telehealth: Payer: Self-pay | Admitting: Family Medicine

## 2021-05-25 NOTE — Telephone Encounter (Signed)
Patient states he is no longer in [place], and ran into an issues last month where he went to a local doctor that ran lab work on him. He states his cholesterol (LBL) was a bit high and wanted to see what Dr. Etter Sjogren recommended. Pt states he used to be on cholesterol medication and doesn't know if he needs to get back on it. He plans on continuing care with Dr. Etter Sjogren, he just had an issue that needed to be checked out outside of [place]. Pt states he could fax over lab work if needed. Please advice.

## 2021-05-28 NOTE — Telephone Encounter (Signed)
Spoke with patient. Pt states currently living in [place]. I did advise him we can no longer treat him since he is outside of [place]. Pt states he went to see a provider because he was having dizziness for about 4 months, once to twice weekly. The provider did labs and a MRI. They only thing that came back abnormal was his cholesterol. Pt wanted to know should he start medication. Pt was advised that we can treat out of [place]. Please advise

## 2021-05-30 NOTE — Telephone Encounter (Signed)
Patient will get results faxed over for review.

## 2023-12-22 ENCOUNTER — Ambulatory Visit (INDEPENDENT_AMBULATORY_CARE_PROVIDER_SITE_OTHER): Admitting: Family Medicine

## 2023-12-22 ENCOUNTER — Encounter: Payer: Self-pay | Admitting: Family Medicine

## 2023-12-22 VITALS — BP 120/70 | HR 73 | Temp 98.1°F | Resp 18 | Ht 69.0 in | Wt 207.4 lb

## 2023-12-22 DIAGNOSIS — R0683 Snoring: Secondary | ICD-10-CM

## 2023-12-22 DIAGNOSIS — N400 Enlarged prostate without lower urinary tract symptoms: Secondary | ICD-10-CM | POA: Diagnosis not present

## 2023-12-22 DIAGNOSIS — E785 Hyperlipidemia, unspecified: Secondary | ICD-10-CM | POA: Diagnosis not present

## 2023-12-22 DIAGNOSIS — Z1211 Encounter for screening for malignant neoplasm of colon: Secondary | ICD-10-CM

## 2023-12-22 DIAGNOSIS — Z0000 Encounter for general adult medical examination without abnormal findings: Secondary | ICD-10-CM

## 2023-12-22 DIAGNOSIS — Z23 Encounter for immunization: Secondary | ICD-10-CM

## 2023-12-22 DIAGNOSIS — N529 Male erectile dysfunction, unspecified: Secondary | ICD-10-CM | POA: Diagnosis not present

## 2023-12-22 LAB — COMPREHENSIVE METABOLIC PANEL WITH GFR
ALT: 20 U/L (ref 0–53)
AST: 22 U/L (ref 0–37)
Albumin: 4.4 g/dL (ref 3.5–5.2)
Alkaline Phosphatase: 68 U/L (ref 39–117)
BUN: 12 mg/dL (ref 6–23)
CO2: 28 meq/L (ref 19–32)
Calcium: 9.4 mg/dL (ref 8.4–10.5)
Chloride: 106 meq/L (ref 96–112)
Creatinine, Ser: 1.09 mg/dL (ref 0.40–1.50)
GFR: 75.34 mL/min (ref >60.00)
Glucose, Bld: 97 mg/dL (ref 70–99)
Potassium: 3.9 meq/L (ref 3.5–5.1)
Sodium: 141 meq/L (ref 135–145)
Total Bilirubin: 0.9 mg/dL (ref 0.2–1.2)
Total Protein: 7.1 g/dL (ref 6.0–8.3)

## 2023-12-22 LAB — CBC WITH DIFFERENTIAL/PLATELET
Basophils Absolute: 0 K/uL (ref 0.0–0.1)
Basophils Relative: 0.6 % (ref 0.0–3.0)
Eosinophils Absolute: 0 K/uL (ref 0.0–0.7)
Eosinophils Relative: 0.9 % (ref 0.0–5.0)
HCT: 44 % (ref 39.0–52.0)
Hemoglobin: 14.3 g/dL (ref 13.0–17.0)
Lymphocytes Relative: 24 % (ref 12.0–46.0)
Lymphs Abs: 1 K/uL (ref 0.7–4.0)
MCHC: 32.5 g/dL (ref 30.0–36.0)
MCV: 87.5 fl (ref 78.0–100.0)
Monocytes Absolute: 0.4 K/uL (ref 0.1–1.0)
Monocytes Relative: 9.3 % (ref 3.0–12.0)
Neutro Abs: 2.7 K/uL (ref 1.4–7.7)
Neutrophils Relative %: 65.2 % (ref 43.0–77.0)
Platelets: 198 K/uL (ref 150.0–400.0)
RBC: 5.03 Mil/uL (ref 4.22–5.81)
RDW: 13.4 % (ref 11.5–15.5)
WBC: 4.1 K/uL (ref 4.0–10.5)

## 2023-12-22 LAB — TSH: TSH: 1.53 u[IU]/mL (ref 0.35–5.50)

## 2023-12-22 LAB — LIPID PANEL
Cholesterol: 163 mg/dL (ref 0–200)
HDL: 61.7 mg/dL (ref >39.00)
LDL Cholesterol: 82 mg/dL (ref 0–99)
NonHDL: 101.56
Total CHOL/HDL Ratio: 3
Triglycerides: 97 mg/dL (ref 0.0–149.0)
VLDL: 19.4 mg/dL (ref 0.0–40.0)

## 2023-12-22 LAB — PSA: PSA: 5.39 ng/mL — ABNORMAL HIGH (ref 0.10–4.00)

## 2023-12-22 MED ORDER — TADALAFIL 5 MG PO TABS: 5.0000 mg | ORAL_TABLET | Freq: Every day | ORAL | 11 refills | Status: AC

## 2023-12-22 NOTE — Progress Notes (Signed)
 Subjective:    Patient ID: Juan Velazquez, male    DOB: 03-16-1966, 58 y.o.   MRN: [id number]  Chief Complaint  Patient presents with   Annual Exam    Pt states fasting     HPI Patient is in today for cpe   Discussed the use of AI scribe software for clinical note transcription with the patient, who gave verbal consent to proceed.  History of Present Illness Juan Velazquez is a 58 year old male who presents for follow-up on elevated PSA levels and sleep apnea concerns.  He has a history of elevated PSA levels, previously noted as high but borderline during a urologist visit. He missed a scheduled follow-up in the summer due to relocation. He is currently taking daily Cialis , though he is unsure of the exact dosage, possibly 5 mg or 10 mg, prescribed by his primary care provider. 0 He underwent a sleep study in 2016 or 2017, and at the time the doctor stated that he was borderline for sleep apnea. Since getting married, his wife has observed possible sleep apnea symptoms, prompting him to seek another sleep study.  He experiences infrequent tinnitus in both ears, not always simultaneously. He has not had a recent hearing exam but recalls [facility] late 2024 or early 2025, which indicated normal hearing. No significant hearing loss, and he does not turn the TV up loudly.  He mentions significant weight loss, attributed to changes in exercise and diet due to recent life changes. He is not currently obese. He recently got [place] and is in the process of adopting his wife's niece and nephew, aged 5 and 6, respectively.    Past Medical History:  Diagnosis Date   Allergy    Gastric polyp 08/06/2010   GERD (gastroesophageal reflux disease)    Hyperplastic colonic polyp 09/01/2009    Past Surgical History:  Procedure Laterality Date   ACHILLES TENDON REPAIR Right 10/2011    Family History  Problem Relation Age of Onset   Diabetes Mother     Hypertension Mother    Kidney disease Mother    Diabetes type II Brother    Hypertension Brother    Diabetes Brother    Diabetes Sister    Heart disease Father        defibrillator   Colon cancer Maternal Aunt    Cancer Other     Social History   Socioeconomic History   Marital status: Married    Spouse name: Not on file   Number of children: 0   Years of education: Not on file   Highest education level: Not on file  Occupational History   Occupation: dept [employer] --w/s    Employer: [employer]  Tobacco Use   Smoking status: Never   Smokeless tobacco: Never  Vaping Use   Vaping status: Never Used  Substance and Sexual Activity   Alcohol use: Yes    Alcohol/week: 0.0 standard drinks of alcohol    Comment: rare   Drug use: No   Sexual activity: Yes    Partners: Female  Other Topics Concern   Not on file  Social History Narrative   Exercise-- 3x a week   Social Drivers of Corporate investment banker Strain: Not on file  Food Insecurity: Not on file  Transportation Needs: Not on file  Physical Activity: Not on file  Stress: Not on file  Social Connections: Unknown (09/21/2021)   Received from [facility]   Social Network    Social Network: Not on file  Intimate Partner Violence: Unknown (08/20/2021)   Received from [facility]   HITS    Physically Hurt: Not on file    Insult or Talk Down To: Not on file    Threaten Physical Harm: Not on file    Scream or Curse: Not on file    Outpatient Medications Prior to Visit  Medication Sig Dispense Refill   benzonatate  (TESSALON ) 100 MG capsule Take 1 capsule (100 mg total) by mouth 3 (three) times daily as needed. 30 capsule 0   fluticasone  (FLOVENT  HFA) 110 MCG/ACT inhaler INHALE 2 PUFFS INTO THE LUNGS IN THE MORNING AND AT BEDTIME. RINSE MOUTH OUT AFTER USE. 12 each 1   sertraline  (ZOLOFT ) 50 MG tablet Take 1 tablet (50 mg total) by mouth daily. 90 tablet 3   Zoster Vaccine Adjuvanted (SHINGRIX ) injection  0.5 ml x1, may repeat in 2 months (Patient not taking: Reported on 12/22/2023) 0.5 mL 1   No facility-administered medications prior to visit.    No Known Allergies  Review of Systems  Constitutional:  Negative for chills, fever and malaise/fatigue.  HENT:  Negative for congestion and hearing loss.   Eyes:  Negative for blurred vision and discharge.  Respiratory:  Negative for cough, sputum production and shortness of breath.   Cardiovascular:  Negative for chest pain, palpitations and leg swelling.  Gastrointestinal:  Negative for abdominal pain, blood in stool, constipation, diarrhea, heartburn, nausea and vomiting.  Genitourinary:  Negative for dysuria, frequency, hematuria and urgency.  Musculoskeletal:  Negative for back pain, falls and myalgias.  Skin:  Negative for rash.  Neurological:  Negative for dizziness, sensory change, loss of consciousness, weakness and headaches.  Endo/Heme/Allergies:  Negative for environmental allergies. Does not bruise/bleed easily.  Psychiatric/Behavioral:  Negative for depression and suicidal ideas. The patient is not nervous/anxious and does not have insomnia.        Objective:    Physical Exam Vitals and nursing note reviewed.  Constitutional:      General: He is not in acute distress.    Appearance: Normal appearance. He is well-developed.  HENT:     Head: Normocephalic and atraumatic.     Right Ear: Tympanic membrane, ear canal and external ear normal. There is no impacted cerumen.     Left Ear: Tympanic membrane, ear canal and external ear normal. There is no impacted cerumen.     Nose: Nose normal.     Mouth/Throat:     Mouth: Mucous membranes are moist.     Pharynx: Oropharynx is clear. No oropharyngeal exudate or posterior oropharyngeal erythema.  Eyes:     General: No scleral icterus.       Right eye: No discharge.        Left eye: No discharge.     Conjunctiva/sclera: Conjunctivae normal.     Pupils: Pupils are equal, round, and  reactive to light.  Neck:     Thyroid : No thyromegaly.     Vascular: No JVD.  Cardiovascular:     Rate and Rhythm: Normal rate and regular rhythm.     Heart sounds: Normal heart sounds. No murmur heard. Pulmonary:     Effort: Pulmonary effort is normal. No respiratory distress.     Breath sounds: Normal breath sounds.  Abdominal:     General: Bowel sounds are normal. There is no distension.     Palpations: Abdomen is soft. There is no mass.  Tenderness: There is no abdominal tenderness. There is no guarding or rebound.  Musculoskeletal:        General: Normal range of motion.     Cervical back: Normal range of motion and neck supple.     Right lower leg: No edema.     Left lower leg: No edema.  Lymphadenopathy:     Cervical: No cervical adenopathy.  Skin:    General: Skin is warm and dry.     Findings: No erythema or rash.  Neurological:     Mental Status: He is alert and oriented to person, place, and time.     Cranial Nerves: No cranial nerve deficit.     Motor: No abnormal muscle tone.     Deep Tendon Reflexes: Reflexes are normal and symmetric. Reflexes normal.  Psychiatric:        Mood and Affect: Mood normal.        Behavior: Behavior normal.        Thought Content: Thought content normal.        Judgment: Judgment normal.     BP 120/70 (BP Location: Left Arm, Patient Position: Sitting, Cuff Size: Large)   Pulse 73   Temp 98.1 F (36.7 C) (Oral)   Resp 18   Ht 5' 9 (1.753 m)   Wt 207 lb 6.4 oz (94.1 kg)   SpO2 96%   BMI 30.63 kg/m  Wt Readings from Last 3 Encounters:  12/22/23 207 lb 6.4 oz (94.1 kg)  12/29/20 226 lb 3.2 oz (102.6 kg)  03/30/20 221 lb (100.2 kg)    Diabetic Foot Exam - Simple   No data filed    Lab Results  Component Value Date   WBC 4.2 12/29/2020   HGB 14.8 12/29/2020   HCT 45.5 12/29/2020   PLT 202 12/29/2020   GLUCOSE 76 12/29/2020   CHOL 210 (H) 12/29/2020   TRIG 132 12/29/2020   HDL 53 12/29/2020   LDLDIRECT 133.7  07/01/2012   LDLCALC 132 (H) 12/29/2020   ALT 21 12/29/2020   AST 25 12/29/2020   NA 140 12/29/2020   K 4.3 12/29/2020   CL 105 12/29/2020   CREATININE 1.24 12/29/2020   BUN 10 12/29/2020   CO2 26 12/29/2020   TSH 1.64 12/29/2020   PSA 3.49 12/29/2020   HGBA1C 5.8 01/27/2018    Lab Results  Component Value Date   TSH 1.64 12/29/2020   Lab Results  Component Value Date   WBC 4.2 12/29/2020   HGB 14.8 12/29/2020   HCT 45.5 12/29/2020   MCV 90.6 12/29/2020   PLT 202 12/29/2020   Lab Results  Component Value Date   NA 140 12/29/2020   K 4.3 12/29/2020   CO2 26 12/29/2020   GLUCOSE 76 12/29/2020   BUN 10 12/29/2020   CREATININE 1.24 12/29/2020   BILITOT 0.9 12/29/2020   ALKPHOS 74 05/25/2019   AST 25 12/29/2020   ALT 21 12/29/2020   PROT 6.8 12/29/2020   ALBUMIN 4.3 05/25/2019   CALCIUM  9.1 12/29/2020   GFR 73.13 05/25/2019   Lab Results  Component Value Date   CHOL 210 (H) 12/29/2020   Lab Results  Component Value Date   HDL 53 12/29/2020   Lab Results  Component Value Date   LDLCALC 132 (H) 12/29/2020   Lab Results  Component Value Date   TRIG 132 12/29/2020   Lab Results  Component Value Date   CHOLHDL 4.0 12/29/2020   Lab Results  Component Value Date  HGBA1C 5.8 01/27/2018       Assessment & Plan:  Preventative health care Assessment & Plan: Ghm utd Check labs  See AVS  Health Maintenance  Topic Date Due   Colonoscopy  09/02/2022   COVID-19 Vaccine (5 - 2024-25 season) 01/19/2023   INFLUENZA VACCINE  12/19/2023   Pneumococcal Vaccine: 19-49 Years (1 of 2 - PCV) 12/21/2024 (Originally 06/19/1984)   Pneumococcal Vaccine: 50+ Years (1 of 2 - PCV) 12/21/2024 (Originally 06/19/1984)   DTaP/Tdap/Td (4 - Td or Tdap) 12/21/2033   Hepatitis B Vaccines  Completed   Hepatitis C Screening  Completed   HIV Screening  Completed   Zoster Vaccines- Shingrix   Completed   HPV VACCINES  Aged Out   Meningococcal B Vaccine  Aged Out      Orders: -     Lipid panel -     PSA -     TSH -     Comprehensive metabolic panel with GFR -     CBC with Differential/Platelet  Colon cancer screening -     Ambulatory referral to Gastroenterology  Need for shingles vaccine -     Varicella-zoster vaccine IM  Need for Tdap vaccination -     Tdap vaccine greater than or equal to 7yo IM  Benign prostatic hyperplasia, unspecified whether lower urinary tract symptoms present -     PSA -     Ambulatory referral to Urology  Vasculogenic erectile dysfunction, unspecified vasculogenic erectile dysfunction type -     Tadalafil ; Take 1 tablet (5 mg total) by mouth daily.  Dispense: 30 tablet; Refill: 11  Snoring -     Pulmonary Visit  Hyperlipidemia LDL goal <100 Assessment & Plan: Encourage heart healthy diet such as MIND or DASH diet, increase exercise, avoid trans fats, simple carbohydrates and processed foods, consider a krill or fish or flaxseed oil cap daily.       Stamatia Masri R Lowne Chase, DO

## 2023-12-22 NOTE — Assessment & Plan Note (Signed)
 Ghm utd Check labs  See AVS  Health Maintenance  Topic Date Due   Colonoscopy  09/02/2022   COVID-19 Vaccine (5 - 2024-25 season) 01/19/2023   INFLUENZA VACCINE  12/19/2023   Pneumococcal Vaccine: 19-49 Years (1 of 2 - PCV) 12/21/2024 (Originally [birth year]19/1984)   Pneumococcal Vaccine: 50+ Years (1 of 2 - PCV) 12/21/2024 (Originally 06[birth year]19/1984)   DTaP/Tdap/Td (4 - Td or Tdap) 12/21/2033   Hepatitis B Vaccines  Completed   Hepatitis C Screening  Completed   HIV Screening  Completed   Zoster Vaccines- Shingrix   Completed   HPV VACCINES  Aged Out   Meningococcal B Vaccine  Aged Out

## 2023-12-22 NOTE — Assessment & Plan Note (Signed)
 Encourage heart healthy diet such as MIND or DASH diet, increase exercise, avoid trans fats, simple carbohydrates and processed foods, consider a krill or fish or flaxseed oil cap daily.

## 2023-12-24 ENCOUNTER — Telehealth: Payer: Self-pay

## 2023-12-24 ENCOUNTER — Other Ambulatory Visit (HOSPITAL_COMMUNITY): Payer: Self-pay

## 2023-12-24 NOTE — Telephone Encounter (Signed)
 Pharmacy Patient Advocate Encounter   Received notification from CoverMyMeds that prior authorization for Tadalafil  5MG  tablets is required/requested.   Insurance verification completed.   The patient is insured through [facility] .   Per test claim: PA required; PA submitted to above mentioned insurance via CoverMyMeds Key/confirmation #/EOC [id number] Status is pending

## 2023-12-25 ENCOUNTER — Other Ambulatory Visit (HOSPITAL_COMMUNITY): Payer: Self-pay

## 2023-12-25 NOTE — Telephone Encounter (Signed)
 Pharmacy Patient Advocate Encounter  Received notification from [facility] that Prior Authorization for  Tadalafil  5MG  tablets has been DENIED.  Full denial letter will be uploaded to the media tab. See denial reason below.   PA #/Case ID/Reference #: [id number]

## 2023-12-28 ENCOUNTER — Ambulatory Visit: Payer: Self-pay | Admitting: Family Medicine

## 2024-01-28 ENCOUNTER — Ambulatory Visit: Admitting: Urology

## 2024-01-28 ENCOUNTER — Encounter: Payer: Self-pay | Admitting: Urology

## 2024-01-28 VITALS — BP 115/73 | HR 73 | Ht 69.0 in | Wt 210.0 lb

## 2024-01-28 DIAGNOSIS — Z8042 Family history of malignant neoplasm of prostate: Secondary | ICD-10-CM | POA: Diagnosis not present

## 2024-01-28 DIAGNOSIS — N138 Other obstructive and reflux uropathy: Secondary | ICD-10-CM | POA: Diagnosis not present

## 2024-01-28 DIAGNOSIS — R972 Elevated prostate specific antigen [PSA]: Secondary | ICD-10-CM | POA: Diagnosis not present

## 2024-01-28 DIAGNOSIS — N401 Enlarged prostate with lower urinary tract symptoms: Secondary | ICD-10-CM | POA: Diagnosis not present

## 2024-01-28 DIAGNOSIS — N529 Male erectile dysfunction, unspecified: Secondary | ICD-10-CM | POA: Insufficient documentation

## 2024-01-28 LAB — URINALYSIS, ROUTINE W REFLEX MICROSCOPIC
Bilirubin, UA: NEGATIVE
Glucose, UA: NEGATIVE
Ketones, UA: NEGATIVE
Leukocytes,UA: NEGATIVE
Nitrite, UA: NEGATIVE
Protein,UA: NEGATIVE
Specific Gravity, UA: 1.01 (ref 1.005–1.030)
Urobilinogen, Ur: 0.2 mg/dL (ref 0.2–1.0)
pH, UA: 6 (ref 5.0–7.5)

## 2024-01-28 LAB — MICROSCOPIC EXAMINATION

## 2024-01-28 NOTE — Progress Notes (Signed)
 Assessment: 1. Elevated PSA   2. Family history of prostate cancer in father   3. BPH with obstruction/lower urinary tract symptoms   4. Organic impotence     Plan: I personally reviewed the patient's chart including provider notes, and lab results. Today I had a long discussion with the patient regarding PSA and the rationale and controversies of prostate cancer early detection.  I discussed the pros and cons of further evaluation including TRUS and prostate Bx.  Potential adverse events and complications as well as standard instructions were given.  Patient expressed his understanding of these issues. Schedule for prostate ultrasound and biopsy.   Chief Complaint:  Chief Complaint  Patient presents with   Elevated PSA    History of Present Illness:  Juan Velazquez is a 58 y.o. male who is seen in consultation from Antonio Meth, Jamee SAUNDERS, DO for evaluation of BPH with lower urinary tract symptoms and elevated PSA.  PSA results: 7/18 2.86 5/19 2.35 1/21 5.34 8/22 3.49 8/25 5.39  He was previously seen by Dr. Watt in July 2018 for elevated PSA. More recently, he has seen a urologist in [place] for elevated PSA.  He reports that his PSA has been in the 5 range.  No records available for review.  No prostate biopsy or MRI previously. No history of UTIs or prostatitis. He has a family history of prostate cancer with his father.  He has lower urinary tract symptoms including frequency, urgency, nocturia x 2.  No dysuria or gross hematuria. IPSS = 6/4.  He is currently on tadalafil  5 mg daily for erectile dysfunction.  He reports improvement in his erections with the medication.  Past Medical History:  Past Medical History:  Diagnosis Date   Allergy    Gastric polyp 08/06/2010   GERD (gastroesophageal reflux disease)    Hyperplastic colonic polyp 09/01/2009   OSA (obstructive sleep apnea)     Past Surgical History:  Past Surgical History:  Procedure  Laterality Date   ACHILLES TENDON REPAIR Right 10/2011    Allergies:  No Known Allergies  Family History:  Family History  Problem Relation Age of Onset   Heart disease Father        defibrillator   Prostate cancer Father    Diabetes Mother    Hypertension Mother    Kidney disease Mother    Colon cancer Maternal Aunt    Diabetes Sister    Diabetes type II Brother    Hypertension Brother    Diabetes Brother    Cancer Other     Social History:  Social History   Tobacco Use   Smoking status: Never   Smokeless tobacco: Never  Vaping Use   Vaping status: Never Used  Substance Use Topics   Alcohol use: Yes    Alcohol/week: 0.0 standard drinks of alcohol    Comment: rare   Drug use: No    Review of symptoms:  Constitutional:  Negative for unexplained weight loss, night sweats, fever, chills ENT:  Negative for nose bleeds, sinus pain, painful swallowing CV:  Negative for chest pain, shortness of breath, exercise intolerance, palpitations, loss of consciousness Resp:  Negative for cough, wheezing, shortness of breath GI:  Negative for nausea, vomiting, diarrhea, bloody stools GU:  Positives noted in HPI; otherwise negative for gross hematuria, dysuria, urinary incontinence Neuro:  Negative for seizures, poor balance, limb weakness, slurred speech Psych:  Negative for lack of energy, depression, anxiety Endocrine:  Negative for polydipsia, polyuria,  symptoms of hypoglycemia (dizziness, hunger, sweating) Hematologic:  Negative for anemia, purpura, petechia, prolonged or excessive bleeding, use of anticoagulants  Allergic:  Negative for difficulty breathing or choking as a result of exposure to anything; no shellfish allergy; no allergic response (rash/itch) to materials, foods  Physical exam: BP 115/73   Pulse 73   Ht 5' 9 (1.753 m)   Wt 210 lb (95.3 kg)   BMI 31.01 kg/m  GENERAL APPEARANCE:  Well appearing, well developed, well nourished, NAD HEENT: Atraumatic,  Normocephalic, oropharynx clear. NECK: Supple without lymphadenopathy or thyromegaly. LUNGS: Clear to auscultation bilaterally. HEART: Regular Rate and Rhythm without murmurs, gallops, or rubs. ABDOMEN: Soft, non-tender, No Masses. EXTREMITIES: Moves all extremities well.  Without clubbing, cyanosis, or edema. NEUROLOGIC:  Alert and oriented x 3, normal gait, CN II-XII grossly intact.  MENTAL STATUS:  Appropriate. BACK:  Non-tender to palpation.  No CVAT SKIN:  Warm, dry and intact.   GU: Penis:  circumcised Meatus: Normal Scrotum: normal, no masses Testis: normal without masses bilateral Epididymis: normal Prostate: 30 g, NT, no nodules Rectum: Normal tone,  no masses or tenderness   Results: U/A: 0-5 WBCs, 0-2 RBCs

## 2024-02-05 ENCOUNTER — Encounter: Payer: Self-pay | Admitting: Pediatrics

## 2024-02-05 DIAGNOSIS — G4733 Obstructive sleep apnea (adult) (pediatric): Secondary | ICD-10-CM | POA: Diagnosis not present

## 2024-02-18 ENCOUNTER — Ambulatory Visit (HOSPITAL_BASED_OUTPATIENT_CLINIC_OR_DEPARTMENT_OTHER)
Admission: RE | Admit: 2024-02-18 | Discharge: 2024-02-18 | Disposition: A | Discharge status: Home / Self Care | Source: Ambulatory Visit | Attending: Urology | Admitting: Urology

## 2024-02-18 ENCOUNTER — Encounter: Payer: Self-pay | Admitting: Urology

## 2024-02-18 ENCOUNTER — Other Ambulatory Visit (HOSPITAL_BASED_OUTPATIENT_CLINIC_OR_DEPARTMENT_OTHER): Payer: Self-pay | Admitting: Urology

## 2024-02-18 ENCOUNTER — Ambulatory Visit: Admitting: Urology

## 2024-02-18 VITALS — BP 125/60 | HR 67 | Ht 69.0 in | Wt 205.0 lb

## 2024-02-18 DIAGNOSIS — R972 Elevated prostate specific antigen [PSA]: Secondary | ICD-10-CM

## 2024-02-18 DIAGNOSIS — Z2989 Encounter for other specified prophylactic measures: Secondary | ICD-10-CM

## 2024-02-18 DIAGNOSIS — Z8042 Family history of malignant neoplasm of prostate: Secondary | ICD-10-CM

## 2024-02-18 DIAGNOSIS — N138 Other obstructive and reflux uropathy: Secondary | ICD-10-CM | POA: Diagnosis not present

## 2024-02-18 DIAGNOSIS — N4289 Other specified disorders of prostate: Secondary | ICD-10-CM | POA: Diagnosis not present

## 2024-02-18 DIAGNOSIS — C61 Malignant neoplasm of prostate: Secondary | ICD-10-CM | POA: Diagnosis not present

## 2024-02-18 DIAGNOSIS — N411 Chronic prostatitis: Secondary | ICD-10-CM

## 2024-02-18 DIAGNOSIS — N401 Enlarged prostate with lower urinary tract symptoms: Secondary | ICD-10-CM

## 2024-02-18 LAB — URINALYSIS, ROUTINE W REFLEX MICROSCOPIC
Bilirubin, UA: NEGATIVE
Glucose, UA: NEGATIVE
Ketones, UA: NEGATIVE
Leukocytes,UA: NEGATIVE
Nitrite, UA: NEGATIVE
Protein,UA: NEGATIVE
Specific Gravity, UA: 1.025 (ref 1.005–1.030)
Urobilinogen, Ur: 0.2 mg/dL (ref 0.2–1.0)
pH, UA: 5.5 (ref 5.0–7.5)

## 2024-02-18 LAB — MICROSCOPIC EXAMINATION: Bacteria, UA: NONE SEEN

## 2024-02-18 MED ORDER — CEFTRIAXONE SODIUM 500 MG IJ SOLR
1.0000 g | Freq: Once | INTRAMUSCULAR | Status: AC
  Administered 2024-02-18: 1 g via INTRAMUSCULAR

## 2024-02-18 NOTE — Progress Notes (Signed)
 Assessment: 1. Elevated PSA   2. Family history of prostate cancer in father   3. BPH with obstruction/lower urinary tract symptoms     Plan: Post biopsy instructions given Return to office in 7-10 days for biopsy results  Chief Complaint:  Chief Complaint  Patient presents with   Prostate Biopsy    History of Present Illness:  Juan Velazquez is a 58 y.o. male who is seen for further evaluation of an elevated PSA.  PSA results: 7/18 2.86 5/19 2.35 1/21 5.34 8/22 3.49 8/25 5.39  He was previously seen by Dr. Watt in July 2018 for elevated PSA. More recently, he has seen a urologist in [place] for elevated PSA.  He reports that his PSA has been in the 5 range.  No records available for review.  No prostate biopsy or MRI previously. No history of UTIs or prostatitis. He has a family history of prostate cancer with his father.  He has lower urinary tract symptoms including frequency, urgency, nocturia x 2.  No dysuria or gross hematuria. IPSS = 6/4.  He is currently on tadalafil  5 mg daily for erectile dysfunction.  He reports improvement in his erections with the medication.  He presents today for TRUS/BX.  Portions of the above documentation were copied from a prior visit for review purposes only.   Past Medical History:  Past Medical History:  Diagnosis Date   Allergy    Gastric polyp 08/06/2010   GERD (gastroesophageal reflux disease)    Hyperplastic colonic polyp 09/01/2009   OSA (obstructive sleep apnea)     Past Surgical History:  Past Surgical History:  Procedure Laterality Date   ACHILLES TENDON REPAIR Right 10/2011    Allergies:  No Known Allergies  Family History:  Family History  Problem Relation Age of Onset   Heart disease Father        defibrillator   Prostate cancer Father    Diabetes Mother    Hypertension Mother    Kidney disease Mother    Colon cancer Maternal Aunt    Diabetes Sister    Diabetes type II Brother     Hypertension Brother    Diabetes Brother    Cancer Other     Social History:  Social History   Tobacco Use   Smoking status: Never   Smokeless tobacco: Never  Vaping Use   Vaping status: Never Used  Substance Use Topics   Alcohol use: Yes    Alcohol/week: 0.0 standard drinks of alcohol    Comment: rare   Drug use: No    ROS: Constitutional:  Negative for fever, chills, weight loss CV: Negative for chest pain, previous MI, hypertension Respiratory:  Negative for shortness of breath, wheezing, sleep apnea, frequent cough GI:  Negative for nausea, vomiting, bloody stool, GERD  Physical exam: BP 125/60   Pulse 67   Ht 5' 9 (1.753 m)   Wt 205 lb (93 kg)   BMI 30.27 kg/m  GENERAL APPEARANCE:  Well appearing, well developed, well nourished, NAD   Results: U/A: 0-5 WBC, 0-2 RBC  TRANSRECTAL ULTRASOUND AND PROSTATE BIOPSY  Indication:  Elevated PSA  Prophylactic antibiotic administration: Rocephin  All medications that could result in increased bleeding were discontinued within an appropriate period of the time of biopsy.  Risk including bleeding and infection were discussed.  Informed consent was obtained.  The patient was placed in the left lateral decubitus position.  PROCEDURE 1.  TRANSRECTAL ULTRASOUND OF THE PROSTATE  The  7 MHz transrectal probe was used to image the prostate.  Anal stenosis was not noted.  TRUS volume: 39.9 ml  Hypoechoic areas: None  Hyperechoic areas: None  Central calcifications: present  Margins:  normal  Seminal Vesicles: normal   PROCEDURE 2:  PROSTATE BIOPSY  A periprostatic block was performed using 1% lidocaine and transrectal ultrasound guidance. Under transrectal ultrasound guidance, and using the Biopty gun, prostate biopsies were obtained systematically from the apex, mid gland, and base bilaterally.  A total of 12 cores were obtained.  Hemostasis was obtained with gentle pressure on the prostate.  The procedures were  well-tolerated.  No significant bleeding was noted at the end of the procedure.  The patient was stable for discharge from the office.

## 2024-02-18 NOTE — Progress Notes (Signed)
 IM Injection  Patient is present today for an IM Injection for treatment of infection prevention post prostate biopsy Drug: Ceftriaxone Dose:1g Location:left outter buttock  Lot: [id number] Exp:10/2025 Patient tolerated well, no complications were noted  Performed by: C. Koleen, LPN

## 2024-02-20 LAB — PROSTATE CORE NEEDLE BIOPSY

## 2024-02-23 ENCOUNTER — Ambulatory Visit: Payer: Self-pay | Admitting: Urology

## 2024-02-25 ENCOUNTER — Ambulatory Visit: Admitting: Urology

## 2024-02-26 ENCOUNTER — Other Ambulatory Visit: Payer: Self-pay

## 2024-02-26 ENCOUNTER — Ambulatory Visit

## 2024-02-26 VITALS — Ht 69.0 in | Wt 205.0 lb

## 2024-02-26 DIAGNOSIS — Z1211 Encounter for screening for malignant neoplasm of colon: Secondary | ICD-10-CM

## 2024-02-26 MED ORDER — NA SULFATE-K SULFATE-MG SULF 17.5-3.13-1.6 GM/177ML PO SOLN: 1.0000 | Freq: Once | ORAL | 0 refills | Status: AC

## 2024-02-26 NOTE — Progress Notes (Signed)
 Denies allergies to eggs or soy products. Denies complication of anesthesia or sedation. Denies use of weight loss medication. Denies use of O2.   Emmi instructions given for colonoscopy.

## 2024-03-10 NOTE — Progress Notes (Deleted)
 [facility] History and Physical   Primary Care Physician:  Antonio Meth, Jamee SAUNDERS, DO   Reason for Procedure:  Colorectal cancer screening  Plan:    Screening colonoscopy     HPI: Juan Velazquez is a 58 y.o. male undergoing screening colonoscopy for colorectal cancer screening.  Patient previously had colonoscopy in 2011 demonstrating a hyperplastic polyp.  Follow-up colonoscopy in 2014 was normal.  There is a reported family history of colorectal cancer in the patient's maternal aunt.  No family history of colon polyps.  Patient denies current symptoms of rectal bleeding or change in bowel habits.   Past Medical History:  Diagnosis Date   Allergy    Anxiety    Asthma    Depression    Gastric polyp 08/06/2010   GERD (gastroesophageal reflux disease)    Hyperlipidemia    Hyperplastic colonic polyp 09/01/2009   OSA (obstructive sleep apnea)    Sleep apnea     Past Surgical History:  Procedure Laterality Date   ACHILLES TENDON REPAIR Right 10/2011    Prior to Admission medications   Medication Sig Start Date End Date Taking? Authorizing Provider  cetirizine (ZYRTEC) 10 MG chewable tablet Chew 10 mg by mouth daily.    [provider]  OVER THE COUNTER MEDICATION Flonase , two sprays as needed.    [provider]  tadalafil  (CIALIS ) 5 MG tablet Take 1 tablet (5 mg total) by mouth daily. 12/22/23   Antonio Meth Jamee SAUNDERS, DO    Current Outpatient Medications  Medication Sig Dispense Refill   cetirizine (ZYRTEC) 10 MG chewable tablet Chew 10 mg by mouth daily.     OVER THE COUNTER MEDICATION Flonase , two sprays as needed.     tadalafil  (CIALIS ) 5 MG tablet Take 1 tablet (5 mg total) by mouth daily. 30 tablet 11   No current facility-administered medications for this visit.    Allergies as of 03/11/2024   (No Known Allergies)    Family History  Problem Relation Age of Onset   Diabetes Mother    Hypertension Mother    Kidney disease  Mother    Heart disease Father        defibrillator   Prostate cancer Father    Diabetes Sister    Diabetes type II Brother    Hypertension Brother    Diabetes Brother    Colon cancer Maternal Aunt    Cancer Other    Esophageal cancer Neg Hx    Rectal cancer Neg Hx    Stomach cancer Neg Hx     Social History   Socioeconomic History   Marital status: Married    Spouse name: Not on file   Number of children: 0   Years of education: Not on file   Highest education level: Not on file  Occupational History   Occupation: dept [employer] --w/s    Employer: [employer]  Tobacco Use   Smoking status: Never   Smokeless tobacco: Never  Vaping Use   Vaping status: Never Used  Substance and Sexual Activity   Alcohol use: Yes    Alcohol/week: 0.0 standard drinks of alcohol    Comment: rare   Drug use: No   Sexual activity: Yes    Partners: Female  Other Topics Concern   Not on file  Social History Narrative   Exercise-- 3x a week   Social Drivers of Corporate investment banker Strain: Not on file  Food Insecurity: Low Risk  (02/05/2024)  Received from [facility]   Hunger Vital Sign    Within the past 12 months, you worried that your food would run out before you got money to buy more: Never true    Within the past 12 months, the food you bought just didn't last and you didn't have money to get more. : Never true  Transportation Needs: No Transportation Needs (02/05/2024)   Received from [facility]    In the past 12 months, has lack of reliable transportation kept you from medical appointments, meetings, work or from getting things needed for daily living? : No  Physical Activity: Not on file  Stress: Not on file  Social Connections: Unknown (09/21/2021)   Received from [facility]   Social Network    Social Network: Not on file  Intimate Partner Violence: Unknown (08/20/2021)   Received from [facility]   HITS    Physically Hurt: Not on file     Insult or Talk Down To: Not on file    Threaten Physical Harm: Not on file    Scream or Curse: Not on file    Review of Systems:  All other review of systems negative except as mentioned in the HPI.  Physical Exam: Vital signs There were no vitals taken for this visit.  General:   Alert,  Well-developed, well-nourished, pleasant and cooperative in NAD Airway:  Mallampati  Lungs:  Clear throughout to auscultation.   Heart:  Regular rate and rhythm; no murmurs, clicks, rubs,  or gallops. Abdomen:  Soft, nontender and nondistended. Normal bowel sounds.   Neuro/Psych:  Normal mood and affect. A and O x 3  Inocente Hausen, MD [facility]

## 2024-03-11 ENCOUNTER — Encounter: Admitting: Pediatrics

## 2024-03-11 ENCOUNTER — Telehealth: Payer: Self-pay | Admitting: Pediatrics

## 2024-03-11 NOTE — Telephone Encounter (Signed)
 Attempted to call pt to reschedule colonoscopy. No answer. Left message for pt to call back at his convenience to reschedule. Will need updated instructions at that time.

## 2024-03-11 NOTE — Telephone Encounter (Signed)
 Good Morning Dr. Suzann,  I called this patient at 8:15am  today and he stated he had got confused with dates and will not be coming in today.  I will NO SHOW patient.  [employer]

## 2024-03-30 ENCOUNTER — Telehealth: Payer: Self-pay | Admitting: Family Medicine

## 2024-03-30 NOTE — Telephone Encounter (Signed)
 Copied from CRM [id number]. Topic: General - Other >> Mar 30, 2024  3:16 PM Viola F wrote: Patient will be dropping off disability form to the office tomorrow -wants to know if the formed could be emailed

## 2024-03-31 NOTE — Telephone Encounter (Signed)
 Noted. Forms have not been dropped off yet.

## 2024-04-26 NOTE — Telephone Encounter (Signed)
 Copied from CRM [id number]. Topic: General - Other >> Apr 26, 2024  9:14 AM Viola F wrote: Reason for CRM: Patient called to follow up on paperwork that was dropped off 04/01/24 - Please call him with an update at [phone] (M)

## 2024-04-27 ENCOUNTER — Encounter: Payer: Self-pay | Admitting: Family Medicine

## 2024-04-27 NOTE — Telephone Encounter (Unsigned)
 Copied from CRM [id number]. Topic: General - Other >> Apr 26, 2024  9:14 AM Viola F wrote: Reason for CRM: Patient called to follow up on paperwork that was dropped off 04/01/24 - Please call him with an update at [phone] (M) >> Apr 27, 2024 11:16 AM Tinnie C wrote: Pt calling back to check on his [employer] forms for disability stating its been a month since he dropped them off. He says he will upload the forms through mychart to make sure we have them.

## 2024-04-28 NOTE — Telephone Encounter (Signed)
 See my chart message

## 2024-08-18 ENCOUNTER — Ambulatory Visit: Admitting: Urology
